# Patient Record
Sex: Male | Born: 1935 | Race: White | Hispanic: No | Smoking: Former smoker
Health system: Southern US, Community
[De-identification: ages and names within clinical notes are randomized; demographics above are authoritative.]

## PROBLEM LIST (undated history)

## (undated) DIAGNOSIS — K222 Esophageal obstruction: Secondary | ICD-10-CM

## (undated) DIAGNOSIS — K449 Diaphragmatic hernia without obstruction or gangrene: Secondary | ICD-10-CM

## (undated) DIAGNOSIS — H9190 Unspecified hearing loss, unspecified ear: Secondary | ICD-10-CM

## (undated) DIAGNOSIS — N529 Male erectile dysfunction, unspecified: Secondary | ICD-10-CM

## (undated) DIAGNOSIS — K5730 Diverticulosis of large intestine without perforation or abscess without bleeding: Secondary | ICD-10-CM

## (undated) DIAGNOSIS — K219 Gastro-esophageal reflux disease without esophagitis: Secondary | ICD-10-CM

## (undated) DIAGNOSIS — Z85038 Personal history of other malignant neoplasm of large intestine: Secondary | ICD-10-CM

## (undated) DIAGNOSIS — I639 Cerebral infarction, unspecified: Secondary | ICD-10-CM

## (undated) DIAGNOSIS — Z9989 Dependence on other enabling machines and devices: Secondary | ICD-10-CM

## (undated) DIAGNOSIS — B009 Herpesviral infection, unspecified: Secondary | ICD-10-CM

## (undated) DIAGNOSIS — R42 Dizziness and giddiness: Secondary | ICD-10-CM

## (undated) DIAGNOSIS — R972 Elevated prostate specific antigen [PSA]: Secondary | ICD-10-CM

## (undated) DIAGNOSIS — G459 Transient cerebral ischemic attack, unspecified: Secondary | ICD-10-CM

## (undated) DIAGNOSIS — N400 Enlarged prostate without lower urinary tract symptoms: Secondary | ICD-10-CM

## (undated) DIAGNOSIS — G4710 Hypersomnia, unspecified: Secondary | ICD-10-CM

## (undated) DIAGNOSIS — E785 Hyperlipidemia, unspecified: Secondary | ICD-10-CM

## (undated) DIAGNOSIS — G4700 Insomnia, unspecified: Secondary | ICD-10-CM

## (undated) DIAGNOSIS — N419 Inflammatory disease of prostate, unspecified: Secondary | ICD-10-CM

## (undated) DIAGNOSIS — G4733 Obstructive sleep apnea (adult) (pediatric): Secondary | ICD-10-CM

## (undated) DIAGNOSIS — K2270 Barrett's esophagus without dysplasia: Secondary | ICD-10-CM

## (undated) DIAGNOSIS — R413 Other amnesia: Secondary | ICD-10-CM

## (undated) DIAGNOSIS — C61 Malignant neoplasm of prostate: Secondary | ICD-10-CM

## (undated) HISTORY — DX: Diaphragmatic hernia without obstruction or gangrene: K44.9

## (undated) HISTORY — PX: EYE SURGERY: SHX253

## (undated) HISTORY — DX: Transient cerebral ischemic attack, unspecified: G45.9

## (undated) HISTORY — DX: Hyperlipidemia, unspecified: E78.5

## (undated) HISTORY — DX: Obstructive sleep apnea (adult) (pediatric): G47.33

## (undated) HISTORY — DX: Other amnesia: R41.3

## (undated) HISTORY — DX: Herpesviral infection, unspecified: B00.9

## (undated) HISTORY — DX: Esophageal obstruction: K22.2

## (undated) HISTORY — DX: Personal history of other malignant neoplasm of large intestine: Z85.038

## (undated) HISTORY — DX: Barrett's esophagus without dysplasia: K22.70

## (undated) HISTORY — DX: Hypersomnia, unspecified: G47.10

## (undated) HISTORY — DX: Cerebral infarction, unspecified: I63.9

## (undated) HISTORY — DX: Diverticulosis of large intestine without perforation or abscess without bleeding: K57.30

## (undated) HISTORY — DX: Elevated prostate specific antigen (PSA): R97.20

## (undated) HISTORY — DX: Malignant neoplasm of prostate: C61

## (undated) HISTORY — DX: Dizziness and giddiness: R42

## (undated) HISTORY — DX: Dependence on other enabling machines and devices: Z99.89

## (undated) HISTORY — PX: REFRACTIVE SURGERY: SHX103

## (undated) HISTORY — DX: Benign prostatic hyperplasia without lower urinary tract symptoms: N40.0

## (undated) HISTORY — PX: INTRACAPSULAR CATARACT EXTRACTION: SHX361

## (undated) HISTORY — DX: Male erectile dysfunction, unspecified: N52.9

---

## 2004-07-18 ENCOUNTER — Inpatient Hospital Stay (HOSPITAL_COMMUNITY): Admission: RE | Admit: 2004-07-18 | Discharge: 2004-07-19 | Payer: Self-pay | Admitting: Surgery

## 2004-09-25 ENCOUNTER — Ambulatory Visit (HOSPITAL_COMMUNITY): Admission: RE | Admit: 2004-09-25 | Discharge: 2004-09-25 | Payer: Self-pay | Admitting: Obstetrics and Gynecology

## 2004-09-25 ENCOUNTER — Encounter (INDEPENDENT_AMBULATORY_CARE_PROVIDER_SITE_OTHER): Payer: Self-pay | Admitting: *Deleted

## 2005-03-05 ENCOUNTER — Ambulatory Visit (HOSPITAL_COMMUNITY): Admission: RE | Admit: 2005-03-05 | Discharge: 2005-03-05 | Payer: Self-pay | Admitting: Surgery

## 2005-03-05 ENCOUNTER — Encounter (INDEPENDENT_AMBULATORY_CARE_PROVIDER_SITE_OTHER): Payer: Self-pay | Admitting: *Deleted

## 2005-06-27 ENCOUNTER — Encounter (INDEPENDENT_AMBULATORY_CARE_PROVIDER_SITE_OTHER): Payer: Self-pay | Admitting: *Deleted

## 2005-06-27 ENCOUNTER — Ambulatory Visit (HOSPITAL_COMMUNITY): Admission: RE | Admit: 2005-06-27 | Discharge: 2005-06-27 | Payer: Self-pay | Admitting: Obstetrics and Gynecology

## 2007-04-29 ENCOUNTER — Encounter (INDEPENDENT_AMBULATORY_CARE_PROVIDER_SITE_OTHER): Payer: Self-pay | Admitting: Gastroenterology

## 2007-04-29 ENCOUNTER — Ambulatory Visit (HOSPITAL_COMMUNITY): Admission: RE | Admit: 2007-04-29 | Discharge: 2007-04-29 | Payer: Self-pay | Admitting: Gastroenterology

## 2009-03-16 ENCOUNTER — Ambulatory Visit (HOSPITAL_COMMUNITY): Admission: RE | Admit: 2009-03-16 | Discharge: 2009-03-16 | Payer: Self-pay | Admitting: Surgery

## 2009-03-21 ENCOUNTER — Ambulatory Visit (HOSPITAL_COMMUNITY): Admission: RE | Admit: 2009-03-21 | Discharge: 2009-03-21 | Payer: Self-pay | Admitting: Family Medicine

## 2009-03-24 ENCOUNTER — Encounter: Admission: RE | Admit: 2009-03-24 | Discharge: 2009-03-24 | Payer: Self-pay | Admitting: Neurology

## 2010-01-18 ENCOUNTER — Encounter (INDEPENDENT_AMBULATORY_CARE_PROVIDER_SITE_OTHER): Payer: Self-pay | Admitting: *Deleted

## 2010-02-16 ENCOUNTER — Encounter (INDEPENDENT_AMBULATORY_CARE_PROVIDER_SITE_OTHER): Payer: Self-pay | Admitting: *Deleted

## 2010-02-16 ENCOUNTER — Ambulatory Visit: Payer: Self-pay | Admitting: Gastroenterology

## 2010-02-16 DIAGNOSIS — B009 Herpesviral infection, unspecified: Secondary | ICD-10-CM | POA: Insufficient documentation

## 2010-02-16 DIAGNOSIS — H04129 Dry eye syndrome of unspecified lacrimal gland: Secondary | ICD-10-CM | POA: Insufficient documentation

## 2010-02-16 DIAGNOSIS — K222 Esophageal obstruction: Secondary | ICD-10-CM | POA: Insufficient documentation

## 2010-02-16 DIAGNOSIS — K219 Gastro-esophageal reflux disease without esophagitis: Secondary | ICD-10-CM

## 2010-03-05 ENCOUNTER — Ambulatory Visit: Payer: Self-pay | Admitting: Gastroenterology

## 2010-04-09 ENCOUNTER — Telehealth: Payer: Self-pay | Admitting: Gastroenterology

## 2010-04-10 ENCOUNTER — Telehealth: Payer: Self-pay | Admitting: Gastroenterology

## 2010-12-05 NOTE — Progress Notes (Signed)
Summary: Insurance denied Aciphex  Phone Note Call from Patient Call back at [phone]   Call For: Dr Jarold Motto Summary of Call: Medicare denied the Aciphex wonders if we can resubmit with maybe less quantity or maybe just one month a time to see if they will cover it then.  Initial call taken by: Leanor Kail [facility],  April 10, 2010 10:34 AM  Follow-up for Phone Call        I was going to order chart to see if patient has tried any other PPIs but patient has no chart-EMR. Prior Authorization form from [employer] for Aciphex was sent but they want patient to try Nexium or Omeprazole first. Will give to Deeann Cree RN to see if we can change patients PPI.   Follow-up by: Ok Anis CMA,  April 11, 2010 3:12 PM  Additional Follow-up for Phone Call Additional follow up Details #1::        Change to Nexium 40 mg once daily. Additional Follow-up by: Ashok Cordia RN,  April 12, 2010 8:26 AM    Additional Follow-up for Phone Call Additional follow up Details #2::    RX for Nexium sent to [facility]  Follow-up by: Ok Anis CMA,  April 12, 2010 9:40 AM  New/Updated Medications: NEXIUM 40 MG CPDR (ESOMEPRAZOLE MAGNESIUM) one tablet by mouth once daily Prescriptions: NEXIUM 40 MG CPDR (ESOMEPRAZOLE MAGNESIUM) one tablet by mouth once daily  #90 x 3   Entered by:   Ok Anis CMA   Authorized by:   Mardella Layman MD [facility]   Signed by:   Ok Anis CMA on 04/12/2010   Method used:   Electronically to        [facility] [address] (retail)       [address] [place], [place]  [address]       Ph: [phone]       Fax: [phone]   RxID:   [id number]

## 2010-12-05 NOTE — Assessment & Plan Note (Signed)
Summary: BARRETTS ESOPHAGUS STRICTURE/YF   History of Present Illness Visit Type: Initial Consult Primary GI MD: Sheryn Bison MD FACP FAGA Primary Provider: Dr Earl Lites Chief Complaint: Patient c/o 2 months solid food dsyphagia. There is no odynophagia. There is no excessive belching, bloating or chest pain. History of Present Illness:   76 year old Caucasian male referred through the courtesy of Dr.Daub for evaluation progressive solid food dysphagia in the upper retropharyngeal area. He has a long history of acid reflux and uses p.r.n. and antacids, and apparently has had side effects in the past from PPI medication. He had endoscopy performed by Dr. Loreta Ave in June of 2008. He has severe esophagitis, underwent esophageal dilatation, and has had intermittent progressive dysphagia since that time. Biopsy for Helicobacter pylori were negative and also there was no evidence of Barrett's mucosa on biopsies.  He has a history of chronic recurrent colon polyps with resection endoscopically of a previous carcinoma in situ by Dr. Ovidio Kin. Last colonoscopy exam by Dr. Ezzard Standing was in May of 2010. He denies lower gastrointestinal symptoms, any hepatobiliary complaints, anorexia, nausea vomiting, or history of hepatitis or pancreatitis. He drinks 3-6 beers a day but denies problems with alcohol abuse. He has not smoked in over 20 years. He denies current cardiovascular, pulmonary, genitourinary complaints but is on Cialis. He is retired, exercises regular, is generally in excellent health.   GI Review of Systems    Reports acid reflux and  heartburn.      Denies abdominal pain, belching, bloating, chest pain, dysphagia with liquids, dysphagia with solids, loss of appetite, nausea, vomiting, vomiting blood, weight loss, and  weight gain.        Denies anal fissure, black tarry stools, change in bowel habit, constipation, diarrhea, diverticulosis, fecal incontinence, heme positive stool, hemorrhoids,  irritable bowel syndrome, jaundice, light color stool, liver problems, rectal bleeding, and  rectal pain. Preventive Screening-Counseling & Management  Alcohol-Tobacco     Smoking Status: quit  Caffeine-Diet-Exercise     Does Patient Exercise: yes      Drug Use:  no.      Current Medications (verified): 1)  Cialis 20 Mg Tabs (Tadalafil) .... Prn 2)  Restasis 0.05 % Emul (Cyclosporine) .... As Needed 3)  Valacyclovir Hcl 1 Gm Tabs (Valacyclovir Hcl) .... 1/2 Qd  Allergies (verified): No Known Drug Allergies  Past History:  Past medical, surgical, family and social histories (including risk factors) reviewed for relevance to current acute and chronic problems.  Past Medical History: Barretts Esophagus Herpes  Past Surgical History: Lasik Eye surgery Left eye cataract extraction  Family History: Reviewed history and no changes required. No FH of Colon Cancer: Family History of Liver Disease/Cirrhosis: Mother   Social History: Reviewed history from 02/14/2010 and no changes required. Alcohol Use - yes-3-6 beers daily Patient is a former smoker. -stopped in 1963 Occupation: Retired Illicit Drug Use - no Patient gets regular exercise. Smoking Status:  quit Drug Use:  no Does Patient Exercise:  yes  Review of Systems  The patient denies allergy/sinus, anemia, anxiety-new, arthritis/joint pain, back pain, blood in urine, breast changes/lumps, change in vision, confusion, cough, coughing up blood, depression-new, fainting, fatigue, fever, headaches-new, hearing problems, heart murmur, heart rhythm changes, itching, menstrual pain, muscle pains/cramps, night sweats, nosebleeds, pregnancy symptoms, shortness of breath, skin rash, sleeping problems, sore throat, swelling of feet/legs, swollen lymph glands, thirst - excessive , urination - excessive , urination changes/pain, urine leakage, vision changes, and voice change.    Vital Signs:  Patient profile:   76 year old  male Height:      66 inches Weight:      148.25 pounds BMI:     24.01 BSA:     1.76 Pulse rate:   68 / minute Pulse rhythm:   regular BP sitting:   144 / 76  (left arm)  Vitals Entered By: Hortense Ramal CMA Duncan Dull) (February 16, 2010 9:10 AM)  Physical Exam  General:  Well developed, well nourished, no acute distress.healthy appearing.  I cannot appreciate stigmata of chronic liver disease. Head:  Normocephalic and atraumatic. Eyes:  PERRLA, no icterus.exam deferred to patient's ophthalmologist.   Neck:  Supple; no masses or thyromegaly. Lungs:  Clear throughout to auscultation. Heart:  Regular rate and rhythm; no murmurs, rubs,  or bruits. Abdomen:  Soft, nontender and nondistended. No masses, hepatosplenomegaly or hernias noted. Normal bowel sounds. Rectal:  deferred Msk:  Symmetrical with no gross deformities. Normal posture. Pulses:  Normal pulses noted. Extremities:  No clubbing, cyanosis, edema or deformities noted. Neurologic:  Alert and  oriented x4;  grossly normal neurologically. Cervical Nodes:  No significant cervical adenopathy. Inguinal Nodes:  No significant inguinal adenopathy. Psych:  Alert and cooperative. Normal mood and affect.   Impression & Recommendations:  Problem # 1:  GERD (ICD-530.81) Assessment Unchanged He has chronic ulcerative esophagitis with a probable peptic stricture of his esophagus. I suspect once he has endoscopy esophageal dilatation he may actually develop clinical GERD symptoms. I scheduled him for endoscopy and esophageal dilatation. He does not desire pharmacologic medications at this time. Therefore, I've advised p.r.n. antacid use of a standard antireflux maneuvers and a dysphagia diet until reaching complete his endoscopic procedure and dilation.The Risk and Benefits of this procedure has been explained in detail, and he agrees to proceed.  Problem # 2:  BARRETTS ESOPHAGUS (ICD-530.85) Assessment: Unchanged This is  not confirmed by  review of his esophageal biopsies. There was benign gastric cardia type mucosa and not intestinal metaplasia on last exam  Problem # 3:  HSV (ICD-054.9) Assessment: Comment Only  Patient Instructions: 1)  Please continue current medications.  2)  You are scheduled for an upper endoscopy. 3)  GI Reflux brochure given.  4)  The medication list was reviewed and reconciled.  All changed / newly prescribed medications were explained.  A complete medication list was provided to the patient / caregiver. 5)  Copy sent to : Dr. Elgie Congo and Dr. Ovidio Kin in [place] [facility] 6)  Please continue current medications.  7)  Dysphagia diet provided.  8)  Avoid foods high in acid content ( tomatoes, citrus juices, spicy foods) . Avoid eating within 3 to 4 hours of lying down or before exercising. Do not over eat; try smaller more frequent meals. Elevate head of bed four inches when sleeping.  9)  Conscious Sedation brochure given.  10)  Upper Endoscopy with Dilatation brochure given.   Appended Document: BARRETTS ESOPHAGUS STRICTURE/YF    Clinical Lists Changes  Orders: Added new Test order of EGD (EGD) - Signed

## 2010-12-05 NOTE — Letter (Signed)
Summary: EGD Instructions  [facility]  [address] [place], [place] [address]   Phone: [phone]  Fax: [phone]       [name]    22-Jan-1935    MRN: [id number]       Procedure Day /Date: Monday, 03/05/10     Arrival Time:  1:30     Procedure Time: 2:30     Location of Procedure:                    X_  _ [facility] (4th Floor)    PREPARATION FOR ENDOSCOPY   On 03/05/10  THE DAY OF THE PROCEDURE:  1.   No solid foods, milk or milk products are allowed after midnight the night before your procedure.  2.   Do not drink anything colored red or purple.  Avoid juices with pulp.  No orange juice.  3.  You may drink clear liquids until 12:30, which is 2 hours before your procedure.                                                                                                CLEAR LIQUIDS INCLUDE: Water Jello Ice Popsicles Tea (sugar ok, no milk/cream) Powdered fruit flavored drinks Coffee (sugar ok, no milk/cream) Gatorade Juice: apple, white grape, white cranberry  Lemonade Clear bullion, consomm, broth Carbonated beverages (any kind) Strained chicken noodle soup Hard Candy   MEDICATION INSTRUCTIONS  Unless otherwise instructed, you should take regular prescription medications with a small sip of water as early as possible the morning of your procedure.                      OTHER INSTRUCTIONS  You will need a responsible adult at least 74 years of age to accompany you and drive you home.   This person must remain in the waiting room during your procedure.  Wear loose fitting clothing that is easily removed.  Leave jewelry and other valuables at home.  However, you may wish to bring a book to read or an iPod/MP3 player to listen to music as you wait for your procedure to start.  Remove all body piercing jewelry and leave at home.  Total time from sign-in until discharge is approximately 2-3 hours.  You should go  home directly after your procedure and rest.  You can resume normal activities the day after your procedure.  The day of your procedure you should not:   Drive   Make legal decisions   Operate machinery   Drink alcohol   Return to work  You will receive specific instructions about eating, activities and medications before you leave.    The above instructions have been reviewed and explained to me by   _______________________    I fully understand and can verbalize these instructions _____________________________ Date _________

## 2010-12-05 NOTE — Progress Notes (Signed)
Summary: Medication  Phone Note Call from Patient Call back at Home Phone [phone]   Caller: Patient Call For: Dr. Jarold Motto Reason for Call: Talk to Nurse Summary of Call: pt is needing a script for Aciphex [facility] Initial call taken by: Karna Christmas,  April 09, 2010 11:12 AM  Follow-up for Phone Call        Pt notified.  Rx sent as requested. Follow-up by: Ashok Cordia RN,  April 09, 2010 11:29 AM    New/Updated Medications: ACIPHEX 20 MG  TBEC (RABEPRAZOLE SODIUM) Take 1 each day 30 minutes before meals Prescriptions: ACIPHEX 20 MG  TBEC (RABEPRAZOLE SODIUM) Take 1 each day 30 minutes before meals  #90 x 3   Entered by:   Ashok Cordia RN   Authorized by:   Mardella Layman MD [facility]   Signed by:   Ashok Cordia RN on 04/09/2010   Method used:   Electronically to        [facility] [address] (retail)       [address] [place], [place]  [address]       Ph: [phone]       Fax: [phone]   RxID:   [id number]

## 2010-12-05 NOTE — Letter (Signed)
Summary: New Patient letter  [facility]  [address] [place], [place] [address]   Phone: [phone]  Fax: [phone]       01/18/2010 MRN: [id number]  [name] [address] [place], [place]  [address]  Dear [name],  Welcome to the Gastroenterology Division at [facility].    You are scheduled to see Dr.  Jarold Motto on 02-16-10 at 9AM_ on the 3rd floor at [facility], [address]  We ask that you try to arrive at our office 15 minutes prior to your appointment time to allow for check-in.  We would like you to complete the enclosed self-administered evaluation form prior to your visit and bring it with you on the day of your appointment.  We will review it with you.  Also, please bring a complete list of all your medications or, if you prefer, bring the medication bottles and we will list them.  Please bring your insurance card so that we may make a copy of it.  If your insurance requires a referral to see a specialist, please bring your referral form from your primary care physician.  Co-payments are due at the time of your visit and may be paid by cash, check or credit card.     Your office visit will consist of a consult with your physician (includes a physical exam), any laboratory testing he/she may order, scheduling of any necessary diagnostic testing (e.g. x-ray, ultrasound, CT-scan), and scheduling of a procedure (e.g. Endoscopy, Colonoscopy) if required.  Please allow enough time on your schedule to allow for any/all of these possibilities.    If you cannot keep your appointment, please call [phone] to cancel or reschedule prior to your appointment date.  This allows [place] the opportunity to schedule an appointment for another patient in need of care.  If you do not cancel or reschedule by 5 p.m. the business day prior to your appointment date, you will be charged a $50.00 late cancellation/no-show fee.    Thank you for choosing  [facility] for your medical needs.  We appreciate the opportunity to care for you.  Please visit [place] at our website  to learn more about our practice.                     Sincerely,                                                             The Gastroenterology Division

## 2010-12-05 NOTE — Procedures (Signed)
Summary: Upper Endoscopy  Patient: [name] Note: All result statuses are Final unless otherwise noted.  Tests: (1) Upper Endoscopy (EGD)   EGD Upper Endoscopy       DONE      [facility]     [address]     [place], [place]  [address]           ENDOSCOPY PROCEDURE REPORT           PATIENT:  [name], [name]  MR#:  [id number]     BIRTHDATE:  [birth year], 74 yrs. old  GENDER:  male           ENDOSCOPIST:  Vania Rea. Jarold Motto, MD, [facility]     Referred by:  Lesle Chris, M.D.           PROCEDURE DATE:  03/05/2010     PROCEDURE:  Elease Hashimoto Dilation of Esophagus, EGD, diagnostic     ASA CLASS:  Class II     INDICATIONS:  dysphagia           MEDICATIONS:   Fentanyl 50, Versed 5 mg IV     TOPICAL ANESTHETIC:  Exactacain Spray           DESCRIPTION OF PROCEDURE:   After the risks benefits and     alternatives of the procedure were thoroughly explained, informed     consent was obtained.  The LB GIF-H180 [id number] endoscope was     introduced through the mouth and advanced to the second portion of     the duodenum, without limitations.  The instrument was slowly     withdrawn as the mucosa was fully examined.     <<PROCEDUREIMAGES>>           A hiatal hernia was found. 5 CM. HH NOTED.STRICTURE AT GE JINCTION     DILATED WITH #59F MAL0ONEY DILATOR.  The esophagus and     gastroesophageal junction were completely normal in appearance.     "LARE INLET POUCH" OF HETEROPHIC GASTRIC MUCOSA IN PROXIMAL     ESOPHAGUS.SEE PICTURES.  Normal duodenal folds were noted.     Retroflexed views revealed a hiatal hernia.    The scope was then     withdrawn from the patient and the procedure completed.           COMPLICATIONS:  None           ENDOSCOPIC IMPRESSION:     1) Hiatal hernia     2) Normal esophagus     3) Normal duodenal folds     MOST SYMPTOMS PROBABLY RELATED TO CERVICAL INLET POUCH AND LOCAL     ACID PRODUCTION AND ?? CHRONIC ASPIRATION.     RECOMMENDATIONS:     TRIAL OF  ACIPHEX 20MG /DAY           REPEAT EXAM:  No           ______________________________     Vania Rea. Jarold Motto, MD, [facility]           CC:           n.     eSIGNED:   [name] at 03/05/2010 03:08 PM           Emery, [name], [id number]  Note: An exclamation mark (!) indicates a result that was not dispersed into the flowsheet. Document Creation Date: 03/05/2010 3:08 PM _______________________________________________________________________  (1) Order result status: Final Collection or observation date-time: 03/05/2010 14:59 Requested date-time:  Receipt date-time:  Reported date-time:  Referring Physician:   Ordering Physician: Sheryn Bison [phone]) Specimen Source:  Source: Launa Grill Order Number: [id number] Lab site:

## 2011-03-19 NOTE — Op Note (Signed)
[name], [name]               ACCOUNT NO.:  [id number]   MEDICAL RECORD NO.:  [id number]          PATIENT TYPE:  AMB   LOCATION:  ENDO                         FACILITY:  [facility]   PHYSICIAN:  Anselmo Rod, M.D.  DATE OF BIRTH:  February 01, 1935   DATE OF PROCEDURE:  04/29/2007  DATE OF DISCHARGE:                               OPERATIVE REPORT   PROCEDURE PERFORMED:  Esophagogastroduodenoscopy with multiple cold  biopsies.   ENDOSCOPIST:  Anselmo Rod, M.D.   INSTRUMENT USED:  Pentax video panendoscope   INDICATIONS FOR PROCEDURE:  76 year old white male with a history of  occasional dysphagia undergoing EGD for meat impaction. The patient  claims he had a hamburger a couple of days ago but has not been able to  swallow his secretions since then.   PREPROCEDURE PREPARATION:  Informed consent was procured from the  patient.  The risks and benefits of the procedure were discussed with  the patient.   PREPROCEDURE PHYSICAL:  The patient had stable vital signs.  Neck  supple.  Chest clear to auscultation.  S1 and S2 regular.  Abdomen soft  with normal bowel sounds.   DESCRIPTION OF PROCEDURE:  The patient was placed in the left lateral  position and sedated with 40 mg of Fentanyl and 4 mg of Versed given  intravenously in slow incremental doses. Once the patient was adequately  sedated and maintained on low flow oxygen and continuous cardiac  monitoring, the Pentax video panendoscope was advanced through the  mouthpiece over the tongue into the esophagus under direct vision. Two  large patches of what appeared to be Barrett's like mucosa was biopsied  from 15 cm. Severe grade 4 distal esophagitis was noted.  There was no  meat impaction found in the esophagus.  The rest of the esophagus was  widely patent. On advancing the scope into the stomach, a small nodule  was noted in the mid body of the stomach that was biopsied. Antral  biopsies were done to rule out the presence of  H. pylori by pathology.  The proximal small bowel appeared normal. There was no obstruction. The  patient tolerated the procedure well without complications.  Retroflexion in the high cardia revealed a hiatal hernia.   IMPRESSION:  1. ?Barrett's mucosa at 15 cm, biopsies done, two large patches noted.  2. Grade 4 distal esophagitis.  3. Small nodule biopsied from midbody of stomach.  4. Antral biopsies done to rule out H. pylori.  5. Normal proximal small bowel.   RECOMMENDATIONS:  1. Double dose PPI have been advised, Nexium 40 mg b.i.d. being called      into his pharmacy.  He is to take this every morning 15-20 minutes      before breakfast and before dinner.  2. Carafate suspension 1 gram q.i.d. has been advised inbetween meals      and at bedtime.  3. Soft diet has been advocated for the next couple of days.  4. Outpatient follow-up in the next two weeks for further      recommendations.      Jyothi Nat  Loreta Ave, M.D.  Electronically Signed     JNM/MEDQ  D:  04/29/2007  T:  04/30/2007  Job:  [id number]   cc:   [name] Canales A. Cleta Alberts, M.D.  Fax: [phone]

## 2011-03-19 NOTE — Op Note (Signed)
NAMENGUYEN, TODOROV               ACCOUNT NO.:  [id number]   MEDICAL RECORD NO.:  [id number]          PATIENT TYPE:  AMB   LOCATION:  ENDO                         FACILITY:  [facility]   PHYSICIAN:  Sandria Bales. Ezzard Standing, M.D.  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   POSTOPERATIVE DIAGNOSIS:  No colonic polyps, pancolonic diverticulosis.   PROCEDURE:  Flexible colonoscopy.   SURGEON:  Sandria Bales. Ezzard Standing, M.D.   ANESTHESIA:  50 mcg of fentanyl, 5 mg of Versed.   INDICATIONS FOR PROCEDURE:  [name] is a 76 year old white male who  I have seen before for colonic polyps.  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  He underwent  subsequently colonoscopies in May of 2006 and August of 2006, with his  last colonoscopy showing some minimal disease still at 15 cm, with no  other polyps identified.  So it has been 3-1/2 years since his last  colonoscopy.  Discussed with him that it would be wise to repeat his  colonoscopy to reevaluate his colon.  The indications and potential  complications were explained to the patient.  The potential  complications include but are not limited to, bleeding, perforation,  inability to remove a polyp or tumor. The patient completed a HalfLytely  bowel prep at home and came to the endoscopy suite at [facility].   PROCEDURE NOTE:  The patient was placed in the left lateral decubitus  position.  He had an IV in his right hand.  He was monitored with pulse  oximetry, EKG, and a blood pressure cuff and given nasal 02 during the  procedure.   He was given 50 mcg of fentanyl and 5 mg of Versed.   A flexible Pentax  colonoscope was passed without difficulty around to his ileocecal valve.  The ileocecal valve was visualized.  He was noted to have some  diverticula of his right colon and of his transverse colon.  Most of the  diverticula were noticed his sigmoid colon.  I went carefully examined  the areas where he had had multiple prior polyps, such as 50 and 30 cm  from the anal verge and I saw no polyps.  Particular attention was paid  to 15 cm from the anal verge where his large polyp was excised several  years ago and though I saw some white scarring, I saw no evidence of any  new mucosal lesion or nodule and nothing to biopsy.  The scope was  retroflexed within his rectum, and there was no polyp or lesion within  the rectum.   With basically a negative exam and no residual polyps, I think now he  can  go to 5 years to his next exam unless he would have symptoms of  polyps before then.      Sandria Bales. Ezzard Standing, M.D.  Electronically Signed     DHN/MEDQ  D:  03/16/2009  T:  03/16/2009  Job:  [id number]   cc:   Bertram Millard. Dahlstedt, M.D.  Fax: [phone]

## 2011-03-22 NOTE — Op Note (Signed)
[name], [name]               ACCOUNT NO.:  [id number]   MEDICAL RECORD NO.:  [id number]          PATIENT TYPE:  AMB   LOCATION:  ENDO                         FACILITY:  [facility]   PHYSICIAN:  Sandria Bales. Ezzard Standing, M.D.  DATE OF BIRTH:  May 21, 1935   DATE OF PROCEDURE:  09/25/2004  DATE OF DISCHARGE:                                 OPERATIVE REPORT   [facility] [phone]   PREOPERATIVE DIAGNOSIS:  History of change in bowel habits with rectal  abscess, need for colonoscopy.   POSTOPERATIVE DIAGNOSIS:  Multiple colonic polyps (at 50 cm, 30 cm, 20 cm  and 15 cm) with the largest being approximately 2 x 4 cm in size and colonic  diverticulosis.   PROCEDURE:  Flexible colonoscopy with hot biopsy of polyp at 50 cm, snare  removal of polyps at 30, 20 and 15 cm.   SURGEON:  Sandria Bales. Ezzard Standing, M.D.   FIRST ASSISTANT:  None.   ANESTHESIA:  Demerol 50 mg and Versed 4 mg.   COMPLICATIONS:  None.   INDICATIONS FOR PROCEDURE:  The patient is a 76 year old white male who  presented in September with a perianal abscess.  I actually saw him in the  office, but then sent him to Dr. Daphine Deutscher who did an incision and drainage at  the hospital. He has come back for evaluation for possible colonoscopy.  He  has had maybe a little bit of change in bowel habits with the rectal  abscess, but no other rectal complications.   The indications and potential complications were explained to the patient.  The potential complications include, but are not limited to, bleeding or  perforation from either the polyp or biopsies.   DESCRIPTION OF PROCEDURE:  The patient was placed in the left lateral  decubitus position.  He had an IV in place. He was monitored by pulse  oximetry, had nasal oxygen running, had an EKG continuous strip and blood  pressure cuff.   He was given initially 50 mg of Demerol and 4 mg of Versed and a flexible  Olympus colonoscope was passed without difficulty around to his cecum.  The  cecum and  ileocecal valve were visualized.  He did have at least two or  three small diverticula of his right colon.  His transverse colon was  unremarkable.  His left colon had maybe some distal diverticula.   At 50 cm, he had an approximately 7 or 8 mm polyp which I burned with hot  biopsy forceps.  At 30 cm, he had about a 1.5 cm polyp which I removed with  the snare and retrieved.  At 20 cm, he had about another 1.5 cm polyp that I  removed with the snare and this time retrieved in the trap and then at 15 cm  he had a sessile mass that was  along the right lateral anterior high rectal  wall.  Again, this measured about 2 x 4 cm that I removed in three separate  snare retrievals.  I thought I had removed at least 90 plus percent of this  polyp and possibly destroyed it  all. I did not want to do any further today  for fear of going through the rectal wall.  All of these specimens were sent  to pathology for review.   The scope was then retroflexed within the rectum.  He had no other rectal  masses noted.   FINAL DIAGNOSIS:  Small polyp at 50 cm, medium size pedunculated polyps at  30 and 15 cm and then a large sessile polyp which had to be removed by  multiple snares at 15 cm from the anal verge.   PLAN:  The patient will call the office to check on the pathology within a  week.  I do think that because of the large sessile polyp, he needs a repeat  colonoscopy in three to four months to make sure this polyp has been excised  in its entirety.  Obviously, if the pathology shows malignancy, this would  change our plan and diagnosis.      Davi   DHN/MEDQ  D:  09/25/2004  T:  09/25/2004  Job:  [id number]   cc:   [name] Canales A. Cleta Alberts, M.D.  [address]  [place]  [place] [address]  Fax: [phone]   Thornton Park. Daphine Deutscher, MD  [address]  [place]  [place] [address]  Fax: [phone]

## 2011-03-22 NOTE — Discharge Summary (Signed)
NAME:  [name], [name]                         ACCOUNT NO.:  [id number]   MEDICAL RECORD NO.:  [id number]                   PATIENT TYPE:  INP   LOCATION:  0456                                 FACILITY:  [facility]   PHYSICIAN:  Thornton Park. Daphine Deutscher, M.D.             DATE OF BIRTH:  02-Nov-1935   DATE OF ADMISSION:  07/17/2004  DATE OF DISCHARGE:  07/19/2004                                 DISCHARGE SUMMARY   ADMITTING DIAGNOSIS:  Perirectal induration and infection.   POSTOPERATIVE DIAGNOSIS:  Perirectal induration and infection.   PROCEDURE:  July 17, 2004, examination under anesthesia/incision and  drainage, perirectal abscess/cellulitis.   HOSPITAL COURSE:  [name] was admitted through our urgent office at [facility]  with perirectal induration which happened to be on the right side.  It was  checked in the holding area and localized, and taken to the OR where the  indurated area on the right side was drained, although there was not any  frank abscess drained.  He was placed on IV Unasyn and responded nicely to  the combination of surgical therapy and antibiotics.  His pain was gone on  the day of discharge, July 19, 2004.   DISCHARGE MEDICATIONS:  At that point, he will be given Augmentin 875 mg  p.o. b.i.d. for 7 days and Vicodin if needed for pain.  He can return to the  Colace that he normally takes at home.   FOLLOWUP:  He is instructed to return to the office in 7-10 days for recheck  and at that time, we are making arrangements for followup colonoscopy with  Dr. Sandria Bales. Newman.   FINAL DIAGNOSIS:  Perirectal infection, status post surgical and medical  treatment.                                               Thornton Park Daphine Deutscher, M.D.    MBM/MEDQ  D:  07/19/2004  T:  07/19/2004  Job:  [id number]

## 2011-03-22 NOTE — Op Note (Signed)
[name], [name]               ACCOUNT NO.:  [id number]   MEDICAL RECORD NO.:  [id number]          PATIENT TYPE:  AMB   LOCATION:  ENDO                         FACILITY:  [facility]   PHYSICIAN:  Sandria Bales. Ezzard Standing, M.D.  DATE OF BIRTH:  01/30/1935   DATE OF PROCEDURE:  03/05/2005  DATE OF DISCHARGE:                                 OPERATIVE REPORT   CCS NUMBER:  [id number].   PREOPERATIVE DIAGNOSIS:  History of multiple colonic polyps.   POSTOPERATIVE DIAGNOSIS:  Single colonic polyp at 15 cm from the anal verge,  approximately 2 cm in diameter, and modest sigmoid colonic diverticulosis.   PROCEDURE:  Flexible colonoscopy, with hot biopsy of polyp at 15 cm.   SURGEON:  Sandria Bales. Ezzard Standing, M.D.   No first assistant.   ANESTHESIA:  50 mg of Demerol, 4 mg of Versed.   INDICATIONS FOR PROCEDURE:  [name] is a 76 year old white male who had  a perirectal abscess in September 2005.  Dr. Daphine Deutscher drained this abscess and  referred him to me for initial colonoscopy.  At that time, he was found to  have multiple colonic polyps at 50 cm, 30 cm, 20 cm, and 15 cm from the anal  verge.  The polyps were all removed with hot biopsy or snare removal.  However, the polyp at 15 cm was large and probably over 4 cm in diameter. 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  I thought I removed at least  90% of that polyp.  He now comes back for reexamination of his colon, both  for the polyp that may be some piece left behind and the remainder of his  colon also.   He completed a HalfLytely bowel prep at home.  He understands the  indications and potential complications to the procedure.  Potential  complications include but are not limited to bleeding and perforation either  from biopsying the polyp or advancing the colonoscope.   OPERATIVE NOTE:  The patient was placed in the left lateral decubitus position.  He had an IV  in place in his left arm.  He was monitored with pulse oximetry, had  nasal  oxygen running at 2 liters.  He had an EKG with a continuous strip and a  blood pressure cuff in place.   He was given 50 mg of Demerol and 3 mg of Versed initially, then an  additional mg of Versed was given during the procedure.  The flexible  Olympus colonoscope was passed without difficulty around to the cecum.  His  cecum and ileocecal valve were visualized.  There were a few small  diverticula of his right colon.  His transverse colon was unremarkable.  His  left colon was unremarkable.  He did have some evidence of sigmoid colonic  diverticula.   Particular attention was paid where I found the previous polyps, and there  was no obvious polyp or growth at 50 cm, 30 cm, or 20 cm.  However, at 15 cm  from the anal verge, the patient did have an approximately 2 cm sessile-  appearing polyp  that still had benign-appearing characteristics.  I biopsied  this multiple times with hot cautery forceps trying to destroy the entire  polyp.   The scope was withdrawn into the rectum and retroflexed.  There was no polyp  or mass within the rectum.   The sole finding was a sessile polyp maybe 2 cm in diameter at 15 cm from  the anal verge.  I think this probably does represent about 5-10% residual  from the previous polyp that had been excised and has benign  characteristics.  If this shows no obvious atypia, then I would recommend a  repeat colonoscopy in six months' time.  If there is atypia, maybe repeat  the colonoscopy at an earlier time.      DHN/MEDQ  D:  03/05/2005  T:  03/05/2005  Job:  [id number]   cc:   [name] Canales A. Cleta Alberts, M.D.  [address]  [place]  [place] [address]  Fax: [phone]

## 2011-03-22 NOTE — Op Note (Signed)
NAME:  [name], [name]                         ACCOUNT NO.:  [id number]   MEDICAL RECORD NO.:  [id number]                   PATIENT TYPE:  AMB   LOCATION:  DAY                                  FACILITY:  [facility]   PHYSICIAN:  Thornton Park. Daphine Deutscher, M.D.             DATE OF BIRTH:  1934-11-09   DATE OF PROCEDURE:  07/17/2004  DATE OF DISCHARGE:                                 OPERATIVE REPORT   PREOPERATIVE DIAGNOSES:  Perirectal induration and tenderness treated by Dr.  Earl Lites over the last several days.   POSTOPERATIVE DIAGNOSES:  Induration, probable cellulitis, status post  examination under anesthesia with incision and drainage.   SURGEON:  Thornton Park. Daphine Deutscher, M.D.   ANESTHESIA:  General.   INDICATIONS FOR PROCEDURE:  [name] was seen in the holding area by me  and examined to localize the area of tenderness. With him in the left  lateral position, he had an area of fullness and more tenderness noted on  the right side which from different from impression by Dr. Ezzard Standing. The  patient, however, was able to be examined by me and there was a fullness but  nothing pointing on the outside. We elected to proceed with exam under  anesthesia with possible drainage.   The patient was taken back to room one where with LMA anesthesia he was  placed up in stirrups.  On the patient's right side, the inside buttock was  examined and was slightly  more red. Digital exam revealed fullness higher  in the mid rectum to palpation and with palpation it did seem there was  possibly an abscess or just indurated tissue. I could not feel anything on  the patient's left side or left posterior area.  I initially attempted  aspiration and inserted an 18 gauge needle that did not receive any  purulence. After examining him on the inside whereupon I could see some  smaller internal hemorrhoids and with palpation, I elected to make the small  radial cut and then probe this area which I did. I did not  encounter any  frank purulence but a little fluid possibly represented infected cellulitis  or partially treated abscess.  At this point, I elected to irrigate this  with hydrogen peroxide and to dress this.  No other palpable areas or  suspicious areas could be identified. The patient will be admitted for  observation and will be placed on IV Unasyn with careful followup.                                               Thornton Park Daphine Deutscher, M.D.    MBM/MEDQ  D:  07/17/2004  T:  07/17/2004  Job:  [id number]

## 2011-03-22 NOTE — Op Note (Signed)
[name], [name]               ACCOUNT NO.:  [id number]   MEDICAL RECORD NO.:  [id number]          PATIENT TYPE:  AMB   LOCATION:  ENDO                         FACILITY:  [facility]   PHYSICIAN:  Sandria Bales. Ezzard Standing, M.D.  DATE OF BIRTH:  August 10, 1935   DATE OF PROCEDURE:  06/27/2005  DATE OF DISCHARGE:                                 OPERATIVE REPORT   PREOPERATIVE DIAGNOSIS:  History of carcinoma in situ and polyp at 15 cm  from the anal verge with multiple other colonic polyps.   POSTOPERATIVE DIAGNOSIS:  Single sessile polyp at 15 cm from the anal verge  approximately 2 cm by 1 cm in size, with pancolonic diverticulosis.   PROCEDURE:  Flexible colonoscopy with hot biopsy of polyp at 15 cm.   SURGEON:  Sandria Bales. Ezzard Standing, M.D.   FIRST ASSISTANT:  None.   ANESTHESIA:  50 mg Demerol and 3 mg Versed.   INDICATIONS FOR PROCEDURE:  [name] is a 76 year old white male who had  a perirectal abscess in September 2005 drained by Dr. Wenda Low.  The  patient never had any prior colonic exam and he asked me to see the patient.  My initial colonoscopy was to find multiple colonic polyps at 50, 30, 20,  and 15 cm.  However, at 15 cm, there was a very large polyp over 4 cm in  diameter and this proved to have carcinoma in situ in the polyp.  He then  came back for a second colonoscopy in May 2006.  At that time, there was a  residual flat polyp which, by biopsy, had dysplasia but no malignancy and I  burned that polyp at that time.  He now comes back for repeat exam of this  area.   DESCRIPTION OF PROCEDURE:  With the patient in the left lateral decubitus  position, had an IV in his right hand.  He was monitored with pulse  oximetry, EKG, and blood pressure cuff, and given nasal O2 running at 2  liters.  He was given 50 mg Demerol and 3 mg Versed initially, and the  flexible Olympus colonoscope was passed without difficulty.   I identified the cecum and ileocecal valve.  There were  diverticula noted in  the right colon, transverse colon, and left colon, but there was no lesion  or polyp noted.  I tried to re-examine the prior areas where the patient had  polyps, at 50, 30, and 20 cm, with particular attention in these areas and  saw no residual polyps.   He did, however, still have about a 2 cm flat sessile polyp at 15 cm from  the anal verge.  It appears maybe about half the size of what I think it  appeared the last time and still had a benign appearance.  I biopsied it  multiple times and then tried to burn it or coagulate it multiple times with  the Bovie cautery.  The scope was then withdrawn in the rectum which was  negative.  He tolerated the procedure well.  Unless the pathology shows  malignancy, would consider repeat colonoscopy  in approximately four months  to have another look at this polyp area.   The patient tolerated the procedure well, will check with [place] within a week  for pathology and schedule his colonoscopy.      Sandria Bales. Ezzard Standing, M.D.  Electronically Signed     DHN/MEDQ  D:  06/27/2005  T:  06/28/2005  Job:  [id number]   cc:   [name] Canales A. Cleta Alberts, M.D.  [address]  [facility]  [place] [address]  Fax: [phone]

## 2011-03-22 NOTE — H&P (Signed)
NAME:  HERSON, [name]                         ACCOUNT NO.:  [id number]   MEDICAL RECORD NO.:  [id number]                   PATIENT TYPE:  AMB   LOCATION:  DAY                                  FACILITY:  [facility]   PHYSICIAN:  Sandria Bales. Ezzard Standing, M.D.               DATE OF BIRTH:  1935-09-14   DATE OF ADMISSION:  07/17/2004  DATE OF DISCHARGE:                                HISTORY & PHYSICAL   HISTORY OF PRESENT ILLNESS:  [name] is a 76 year old white male who  sees Dr. Lesle Chris as his primary medical doctor.  He has had perirectal  pain beginning last Wednesday, July 11, 2004.  He has had worsening  pain.  He actually first saw Dr. Cleta Alberts on Thursday, July 12, 2004 and was  given a shot and given some antibiotics.  He got better for a day or two,  but then got steadily worse.  He was seen also on Friday, then this past  Monday, July 16, 2004, and then Tuesday, which is today, July 17, 2004, with continued rectal pain, and then referred to our office for  further evaluation.   He denies any history of peptic ulcer disease, liver disease, pancreatic  disease, change in bowel habits, though he has noticed a little bit more  infrequent stools this time.   PAST MEDICAL HISTORY:   ALLERGIES:  He is allergic to no medications.   MEDICATIONS:  1.  Cipro.  2.  He was given some Vicodin to Tylenol.  3.  He is on acyclovir for herpes.   REVIEW OF SYSTEMS:  PULMONARY:  No pneumonia or tuberculosis.  CARDIAC:  Negative for heart disease, chest pain, or hypertension.  GASTROINTESTINAL:  See history of present illness.  UROLOGIC:  He has seen Dr. Marcine Matar before for some prostate inflammation.   SOCIAL HISTORY:  He is not working now.   PHYSICAL EXAMINATION:  VITAL SIGNS:  Temperature is 100.6, pulse 84,  respirations 24, blood pressure 176/84.  GENERAL:  He is a well-nourished, pleasant, white male, alert and  cooperative with physical exam.  HEENT:   Unremarkable.  He has some impetigo or kind of paleness to his skin  in spots, __________.  NECK:  Supple without mass, without thyromegaly.  LUNGS:  Clear to auscultation.  HEART:  Regular rate and rhythm without murmur or rub.  ABDOMEN:  Soft.  RECTAL:  He has a fullness in his left posterior rectum consistent with an  intrarectal or perirectal abscess, which I do not see any evidence of a way  to drain this externally.  He is clearly tender in that area.  EXTREMITIES:  Good strength in all 4 extremities.  NEUROLOGIC:  Grossly intact.   LABORATORY DATA:  White blood count of 12,600, with a hemoglobin of 15,  hematocrit of 45.   IMPRESSION:  1.  Intrarectal/perirectal abscess, left posterior.  I have  been in touch      with Dr. Luretha Murphy, who is on call for [place], about examining him and      doing him under general anesthesia.  We also thought about a CT scan,      but I really think this is palpable enough to just go an do surgery,      which seems the most reasonable thing.  I discussed this with the      patient.  He will go to [facility].  He will stay NPO for      drainage of abscess.  2.  History of herpes.                                               Sandria Bales. Ezzard Standing, M.D.    DHN/MEDQ  D:  07/17/2004  T:  07/17/2004  Job:  [id number]   cc:   [name] Canales A. Cleta Alberts, M.D.  [address]  [place]  [place] [address]  Fax: [phone]

## 2011-05-27 ENCOUNTER — Other Ambulatory Visit: Payer: Self-pay | Admitting: *Deleted

## 2011-05-27 MED ORDER — ESOMEPRAZOLE MAGNESIUM 40 MG PO CPDR: DELAYED_RELEASE_CAPSULE | ORAL | Status: DC

## 2011-06-24 ENCOUNTER — Other Ambulatory Visit: Payer: Self-pay | Admitting: Gastroenterology

## 2011-09-13 ENCOUNTER — Ambulatory Visit (INDEPENDENT_AMBULATORY_CARE_PROVIDER_SITE_OTHER): Payer: No Typology Code available for payment source | Admitting: Gastroenterology

## 2011-09-13 ENCOUNTER — Encounter: Payer: Self-pay | Admitting: Gastroenterology

## 2011-09-13 VITALS — BP 130/68 | HR 72 | Ht 66.0 in | Wt 149.4 lb

## 2011-09-13 DIAGNOSIS — K219 Gastro-esophageal reflux disease without esophagitis: Secondary | ICD-10-CM

## 2011-09-13 DIAGNOSIS — Z85038 Personal history of other malignant neoplasm of large intestine: Secondary | ICD-10-CM

## 2011-09-13 MED ORDER — RABEPRAZOLE SODIUM 20 MG PO TBEC: 20.0000 mg | DELAYED_RELEASE_TABLET | Freq: Every day | ORAL | Status: DC

## 2011-09-13 NOTE — Patient Instructions (Signed)
You have been given samples of Aciphex.   Your physician has requested that you go to the basement for lab work before leaving today.  Dr. Jarold Motto would like to see you back next year (2013) for a colonoscopy follow up.  CC: [name].D.

## 2011-09-13 NOTE — Progress Notes (Signed)
This is a 76 year old Caucasian male with chronic acid reflux and a previous esophageal stricture that was dilated with endoscopy in May of 2011. Also at that time he had an inlet pouch noted in its proximal esophagus. He continues with acid reflux he does not take his daily AcipHex. Apparently he has found that vinegar seems to work well, and he uses this daily. He denies dysphagia, or other gastrointestinal symptoms. He has had several colonoscopies by Dr. Ovidio Kin for recurrent colon polyps and one polyp previously had carcinoma in situ. Specifically, he denies rectal bleeding or abdominal pain. Family history is apparently noncontributory.  Current Medications, Allergies, Past Medical History, Past Surgical History, Family History and Social History were reviewed in [employer] record.  Pertinent Review of Systems Negative   Physical Exam: Healthy-appearing male in no acute distress. Examination the oropharynx was difficult because of a very active gag reflex. However, I could not see any definite mucosal or soft palate lesions. I could not appreciate any lymphadenopathy, thyromegaly, or other neck abnormalities.    Assessment and Plan: Chronic GERD with previous peptic stricture dilated. He also has a thoracic inlet pouch of Barrett's mucosa. He  should take daily AcipHex 20 mg 30 minutes before breakfast. He is to return IFOB stool cards for examination. If these are negative, we will do followup colonoscopy in 3 years time. He also is to call should he develop dysphagia or worsening acid reflux symptoms. Otherwise he is to continue medications as listed and reviewed. I reviewed his outside records and colonoscopy reports in detail. Encounter Diagnoses  Name Primary?  . History of colon cancer Yes  . GERD (gastroesophageal reflux disease)

## 2011-09-23 ENCOUNTER — Other Ambulatory Visit: Payer: Self-pay | Admitting: Urology

## 2011-09-23 DIAGNOSIS — C61 Malignant neoplasm of prostate: Secondary | ICD-10-CM

## 2011-10-08 ENCOUNTER — Ambulatory Visit (HOSPITAL_COMMUNITY)
Admission: RE | Admit: 2011-10-08 | Discharge: 2011-10-08 | Disposition: A | Discharge status: Home / Self Care | Payer: No Typology Code available for payment source | Source: Ambulatory Visit | Attending: Urology | Admitting: Urology

## 2011-10-08 DIAGNOSIS — C61 Malignant neoplasm of prostate: Secondary | ICD-10-CM | POA: Insufficient documentation

## 2011-10-08 LAB — CREATININE, SERUM
Creatinine, Ser: 0.8 mg/dL (ref 0.50–1.35)
GFR calc non Af Amer: 86 mL/min — ABNORMAL LOW (ref >90)

## 2011-10-08 MED ORDER — GADOBENATE DIMEGLUMINE 529 MG/ML IV SOLN
15.0000 mL | Freq: Once | INTRAVENOUS | Status: AC | PRN
  Administered 2011-10-08: 13 mL via INTRAVENOUS

## 2011-10-28 ENCOUNTER — Emergency Department (HOSPITAL_COMMUNITY)
Admission: EM | Admit: 2011-10-28 | Discharge: 2011-10-28 | Disposition: A | Discharge status: Home / Self Care | Payer: Medicare Other | Attending: Emergency Medicine | Admitting: Emergency Medicine

## 2011-10-28 ENCOUNTER — Encounter (HOSPITAL_COMMUNITY): Payer: Self-pay | Admitting: *Deleted

## 2011-10-28 ENCOUNTER — Emergency Department (HOSPITAL_COMMUNITY): Payer: Medicare Other

## 2011-10-28 DIAGNOSIS — N419 Inflammatory disease of prostate, unspecified: Secondary | ICD-10-CM | POA: Insufficient documentation

## 2011-10-28 DIAGNOSIS — Z8546 Personal history of malignant neoplasm of prostate: Secondary | ICD-10-CM | POA: Insufficient documentation

## 2011-10-28 DIAGNOSIS — R5381 Other malaise: Secondary | ICD-10-CM | POA: Insufficient documentation

## 2011-10-28 DIAGNOSIS — K5900 Constipation, unspecified: Secondary | ICD-10-CM | POA: Insufficient documentation

## 2011-10-28 DIAGNOSIS — R5383 Other fatigue: Secondary | ICD-10-CM | POA: Insufficient documentation

## 2011-10-28 DIAGNOSIS — R42 Dizziness and giddiness: Secondary | ICD-10-CM | POA: Insufficient documentation

## 2011-10-28 DIAGNOSIS — R634 Abnormal weight loss: Secondary | ICD-10-CM | POA: Insufficient documentation

## 2011-10-28 HISTORY — DX: Inflammatory disease of prostate, unspecified: N41.9

## 2011-10-28 LAB — COMPREHENSIVE METABOLIC PANEL
ALT: 26 U/L (ref 0–53)
AST: 30 U/L (ref 0–37)
Alkaline Phosphatase: 72 U/L (ref 39–117)
BUN: 16 mg/dL (ref 6–23)
CO2: 26 mEq/L (ref 19–32)
Chloride: 96 mEq/L (ref 96–112)
Creatinine, Ser: 0.99 mg/dL (ref 0.50–1.35)
GFR calc non Af Amer: 79 mL/min — ABNORMAL LOW (ref >90)
Glucose, Bld: 111 mg/dL — ABNORMAL HIGH (ref 70–99)
Total Bilirubin: 0.3 mg/dL (ref 0.3–1.2)

## 2011-10-28 LAB — URINALYSIS, ROUTINE W REFLEX MICROSCOPIC
Glucose, UA: NEGATIVE mg/dL
Protein, ur: NEGATIVE mg/dL
pH: 7 (ref 5.0–8.0)

## 2011-10-28 LAB — DIFFERENTIAL
Basophils Relative: 0 % (ref 0–1)
Eosinophils Absolute: 0.1 10*3/uL (ref 0.0–0.7)
Eosinophils Relative: 0 % (ref 0–5)
Monocytes Relative: 17 % — ABNORMAL HIGH (ref 3–12)

## 2011-10-28 LAB — CBC
HCT: 42.7 % (ref 39.0–52.0)
Hemoglobin: 15.1 g/dL (ref 13.0–17.0)
RBC: 4.84 MIL/uL (ref 4.22–5.81)
RDW: 12.3 % (ref 11.5–15.5)

## 2011-10-28 LAB — OCCULT BLOOD, POC DEVICE: Fecal Occult Bld: NEGATIVE

## 2011-10-28 LAB — URINE MICROSCOPIC-ADD ON

## 2011-10-28 MED ORDER — SODIUM CHLORIDE 0.9 % IV BOLUS (SEPSIS): 1000.0000 mL | Freq: Once | INTRAVENOUS | Status: DC

## 2011-10-28 MED ORDER — SODIUM CHLORIDE 0.9 % IV SOLN
INTRAVENOUS | Status: DC
  Administered 2011-10-28: 13:00:00 via INTRAVENOUS

## 2011-10-28 NOTE — ED Notes (Signed)
Pt states "I called my doctor & they told me to come in, I have prostate cancer, have infected prostate, this has been going on x 3 wks"

## 2011-10-28 NOTE — ED Notes (Signed)
Pt to be d/c to home. States he is feeling much better. Educated ie increase of water intake to avoid constipation.Marland KitchenMarland KitchenMarland Kitchen

## 2011-10-28 NOTE — ED Provider Notes (Signed)
History     CSN: [id number]  Arrival date & time 10/28/11  1105   First MD Initiated Contact with Patient 10/28/11 1223      Chief Complaint  Patient presents with  . Weight Loss  . Weakness    (Consider location/radiation/quality/duration/timing/severity/associated sxs/prior treatment) Patient is a 76 y.o. male presenting with weakness. The history is provided by the patient. No language interpreter was used.  Weakness The primary symptoms include dizziness. Primary symptoms do not include headaches, syncope, loss of consciousness, altered mental status, seizures, visual change, paresthesias, focal weakness, loss of sensation, memory loss, fever, nausea or vomiting. The symptoms began more than 1 week ago. The symptoms are unchanged. The symptoms occurred after standing up.  Dizziness also occurs with weakness. Dizziness does not occur with tinnitus, nausea or vomiting.  Additional symptoms include weakness and loss of balance. Additional symptoms do not include pain, lower back pain, tinnitus, anxiety or irritability. Medical issues also include cancer and alcohol use. Medical issues do not include seizures, cerebral vascular accident, drug use, diabetes, hypertension or recent surgery.   Patient is here today complaining of general weakness and some dizziness with ambulating. States that he has been treated for prostate cancer. States that he is on antibiotics for this and goes to Dr. doll stat. They have been treated for 3 weeks so far. He's also been constipated for the last week and then he ended up taking 3 bottles of magnesium citrate. Had a good bowel movement last night but thinks there is some conflict blockage". He also states that he has lost 12 pounds in the last month. If his pain is under control at home with pain medications he does drink 2 drinks a day. Appears to be deconditioned. She is at the bedside also.  Also states that he thinks there might be blood in his  stool. Past Medical History  Diagnosis Date  . Barrett's esophagus   . Herpes   . Cancer     prostate  . Gout   . Prostatitis     Past Surgical History  Procedure Date  . Refractive surgery   . Intracapsular cataract extraction     Lt eye    Family History  Problem Relation Age of Onset  . Colon cancer Neg Hx   . Cirrhosis Mother     Liver disease    History  Substance Use Topics  . Smoking status: Former Games developer  . Smokeless tobacco: Never Used   Comment: Stopped in 1963  . Alcohol Use: Yes     2 drinks nightly      Review of Systems  Constitutional: Negative for fever and irritability.  HENT: Negative for tinnitus.   Cardiovascular: Negative for syncope.  Gastrointestinal: Positive for constipation. Negative for nausea, vomiting, diarrhea and blood in stool.  Neurological: Positive for dizziness, weakness and loss of balance. Negative for focal weakness, seizures, loss of consciousness, headaches and paresthesias.  Psychiatric/Behavioral: Negative for memory loss and altered mental status.  All other systems reviewed and are negative.    Allergies  Review of patient's allergies indicates no known allergies.  Home Medications   Current Outpatient Rx  Name Route Sig Dispense Refill  . ALLOPURINOL 100 MG PO TABS Oral Take 100 mg by mouth daily.      . ASPIRIN 81 MG PO TABS Oral Take 81 mg by mouth daily.      . CYCLOSPORINE 0.05 % OP EMUL  1 drop 2 (two) times daily. As needed.     Marland Kitchen  ESOMEPRAZOLE MAGNESIUM 40 MG PO CPDR  Take one tablet by mouth once a day..... MUST HAVE OFFICE VISIT 30 capsule 0  . PROSTATE HEALTH PO CAPS Oral Take 1 capsule by mouth daily.      . MULTI-VITAMIN/MINERALS PO TABS Oral Take 1 tablet by mouth daily.      Marland Kitchen RABEPRAZOLE SODIUM 20 MG PO TBEC Oral Take 1 tablet (20 mg total) by mouth daily. 30 tablet 1  . VALACYCLOVIR HCL 1 G PO TABS Oral Take 1,000 mg by mouth daily. 1/2 tab daily.    Marland Kitchen TADALAFIL 20 MG PO TABS Oral Take 20 mg by  mouth daily as needed. As needed.       BP 121/69  Pulse 81  Temp(Src) 99.6 F (37.6 C) (Oral)  Resp 16  Wt 138 lb (62.596 kg)  SpO2 96%  Physical Exam  Nursing note and vitals reviewed. Constitutional: He is oriented to person, place, and time. He appears well-developed and well-nourished. No distress.  Eyes: Pupils are equal, round, and reactive to light.  Neck: Normal range of motion.  Cardiovascular: Normal rate.  Exam reveals no gallop.   No murmur heard.      Distant heart sounds  Pulmonary/Chest: Effort normal and breath sounds normal. No respiratory distress. He has no wheezes.  Abdominal: Bowel sounds are normal. He exhibits no distension. There is no tenderness.  Genitourinary: Rectal exam shows tenderness. Rectal exam shows no mass. Prostate is tender.  Musculoskeletal: Normal range of motion. He exhibits no edema and no tenderness.  Neurological: He is alert and oriented to person, place, and time.  Skin: Skin is warm and dry.  Psychiatric: He has a normal mood and affect.    ED Course  Procedures (including critical care time)  Labs Reviewed  CBC - Abnormal; Notable for the following:    WBC 13.6 (*)    All other components within normal limits  DIFFERENTIAL - Abnormal; Notable for the following:    Neutro Abs 9.9 (*)    Lymphocytes Relative 10 (*)    Monocytes Relative 17 (*)    Monocytes Absolute 2.2 (*)    All other components within normal limits  COMPREHENSIVE METABOLIC PANEL - Abnormal; Notable for the following:    Sodium 133 (*)    Glucose, Bld 111 (*)    Albumin 3.4 (*)    GFR calc non Af Amer 79 (*)    All other components within normal limits  URINALYSIS, ROUTINE W REFLEX MICROSCOPIC - Abnormal; Notable for the following:    APPearance CLOUDY (*)    Hgb urine dipstick TRACE (*)    Ketones, ur TRACE (*)    Leukocytes, UA MODERATE (*)    All other components within normal limits  URINE MICROSCOPIC-ADD ON - Abnormal; Notable for the following:     Bacteria, UA FEW (*)    All other components within normal limits  OCCULT BLOOD, POC DEVICE  URINE CULTURE   Dg Abd Acute W/chest  10/28/2011  *RADIOLOGY REPORT*  Clinical Data: Constipation.  Weight loss.  ACUTE ABDOMEN SERIES (ABDOMEN 2 VIEW & CHEST 1 VIEW)  Comparison:  Chest radiograph on 07/17/2004  Findings:  There is no evidence of dilated bowel loops or free intraperitoneal air.  No radiopaque calculi or other significant radiographic abnormality is seen. Pelvic phleboliths noted.  Heart size and mediastinal contours are within normal limits.  Both lungs are clear.  IMPRESSION: Negative abdominal radiographs.  No acute cardiopulmonary disease.  Original Report Authenticated  By: Danae Orleans, M.D.     No diagnosis found.    MDM   Patient presents general weakness constipation and weight loss of 12 pounds over the past month. He is also being treated for prostate infection per Dr. Narda Bonds.  PCP is Dr. Juline Patch at [facility] urgent care.  Recent dx of prostate cancer.  Pain on exam to prostate but is not hot.  Will continue antibiotics and return for follow up this week with [place].  Labs Reviewed  CBC - Abnormal; Notable for the following:    WBC 13.6 (*)    All other components within normal limits  DIFFERENTIAL - Abnormal; Notable for the following:    Neutro Abs 9.9 (*)    Lymphocytes Relative 10 (*)    Monocytes Relative 17 (*)    Monocytes Absolute 2.2 (*)    All other components within normal limits  COMPREHENSIVE METABOLIC PANEL - Abnormal; Notable for the following:    Sodium 133 (*)    Glucose, Bld 111 (*)    Albumin 3.4 (*)    GFR calc non Af Amer 79 (*)    All other components within normal limits  URINALYSIS, ROUTINE W REFLEX MICROSCOPIC - Abnormal; Notable for the following:    APPearance CLOUDY (*)    Hgb urine dipstick TRACE (*)    Ketones, ur TRACE (*)    Leukocytes, UA MODERATE (*)    All other components within normal limits  URINE MICROSCOPIC-ADD ON -  Abnormal; Notable for the following:    Bacteria, UA FEW (*)    All other components within normal limits  OCCULT BLOOD, POC DEVICE  URINE CULTURE        Jethro Bastos, NP 10/28/11 2204

## 2011-10-28 NOTE — ED Notes (Signed)
ED NP at bedside

## 2011-10-30 ENCOUNTER — Telehealth: Payer: Self-pay | Admitting: Gastroenterology

## 2011-10-30 LAB — URINE CULTURE
Colony Count: NO GROWTH
Culture: NO GROWTH

## 2011-10-30 NOTE — Telephone Encounter (Signed)
Pt reports abd pain, weakness and constipation since his 09/13/11 OV with Dr Jarold Motto. He went to the ER Christmas Eve for weight loss and weakness as well as a new onset of constipation. Basically, nothing was found in the ER and pt was instructed to f/u with Dr Retta Diones; he informed ER staff he is being tx for prostate infection along with constipation. Pt seen 09/13/11 by Dr Jarold Motto; he was to do FOBT and then COLON would be done now or in 3 years depending on the result. Pt reports he did not do the cards because he knew they would show blood d/t the prostate; I cancelled the FOBT's today. An MRI was done after the prostate bx and he was placed on antibiotics. He states the constipation started after the bx; now the only way he can go is with Magnesium Citrate. He c/o abdominal pain below the waist on both sides. Pt was given an appt with Willette Cluster, NP tomorrow at 1:30pm.

## 2011-10-31 ENCOUNTER — Encounter: Payer: Self-pay | Admitting: Nurse Practitioner

## 2011-10-31 ENCOUNTER — Ambulatory Visit (INDEPENDENT_AMBULATORY_CARE_PROVIDER_SITE_OTHER): Payer: Medicare Other

## 2011-10-31 ENCOUNTER — Telehealth: Payer: Self-pay | Admitting: *Deleted

## 2011-10-31 ENCOUNTER — Ambulatory Visit (INDEPENDENT_AMBULATORY_CARE_PROVIDER_SITE_OTHER): Payer: No Typology Code available for payment source | Admitting: Internal Medicine

## 2011-10-31 DIAGNOSIS — K921 Melena: Secondary | ICD-10-CM

## 2011-10-31 DIAGNOSIS — R198 Other specified symptoms and signs involving the digestive system and abdomen: Secondary | ICD-10-CM

## 2011-10-31 DIAGNOSIS — R634 Abnormal weight loss: Secondary | ICD-10-CM

## 2011-10-31 DIAGNOSIS — K6289 Other specified diseases of anus and rectum: Secondary | ICD-10-CM

## 2011-10-31 DIAGNOSIS — C61 Malignant neoplasm of prostate: Secondary | ICD-10-CM

## 2011-10-31 DIAGNOSIS — R194 Change in bowel habit: Secondary | ICD-10-CM

## 2011-10-31 MED ORDER — PEG-KCL-NACL-NASULF-NA ASC-C 100 G PO SOLR: ORAL | Status: DC

## 2011-10-31 MED ORDER — ESOMEPRAZOLE MAGNESIUM 40 MG PO CPDR: 40.0000 mg | DELAYED_RELEASE_CAPSULE | Freq: Every day | ORAL | Status: DC

## 2011-10-31 NOTE — Telephone Encounter (Signed)
Pt saw Dr Cleta Alberts this am and Dr Cleta Alberts suspects pt may have COLON CA. In ER pt had Heme - stools, but this am the stools were Heme +, but brown in color. Dr Cleta Alberts states the pt looks like someone who's chronically ill. Dr Cleta Alberts will fax over his office notes and labs from this am.

## 2011-10-31 NOTE — Progress Notes (Addendum)
[name] [id number] [birth year]   HISTORY OR PRESENT ILLNESS :  Patient is a 76 year old male known to Dr. Jarold Motto for his of GERD complicated by peptic strictures requiring dilation. Patient also has a history of colon polyps including a distal sigmoid adenocarcinoma in situ in 2005. He is followed by Dr. Ezzard Standing (surgery) who has done at least 3 surveillance lower endoscopies since 2005. Patient's last colonoscopy was in May 2010 and it was completely normal. A repeat colonoscopy was recommended at 5 year mark. Patient saw Dr. Jarold Motto for GERD related symptoms early last month. At that visit Dr. Jarold Motto discussed patient's history of colon polyps Plan was for interval screening with IFOB stool cards. If negative then surveillance colonoscopy would be done in 3 years (at 5 year mark from last colonoscopy in 2010).   Since patient's visit here early last month patient had a prostate biopsy and has been diagnosed prostate cancer (Gleason grade 3+3 =6). He comes in today with rectal pain which started a few days after biopsy. The pain is mainly when standing or sitting, feels okay when lying down. Patient saw urologist and has been treated with three different rounds of antibiotics for presumed prostatitis. An MR of the prostate done 10/08/11 showed extensive biopsy hemorrhage.  Patient went ER on 12/24 with dizziness, weakness and recent constipation. His WBC was elevated at 13.6, hemoglobin was normal at 15.1. Urine showed some leukocytes and bacteria but culture is negative. Abdominal series was negative. Patient went to PCP this am for ongoing discomfort. In office patient has Hemosure positive. PCP called our office this am requesting patient been seen for a colonoscopy to rule out colon cancer. Patient hasn't had any overt GI bleeding. Denies abdominal pain. He has recently lost about 14 pounds for unclear reasons. No nausea or early satiety, just poor appetite. No dysuria but some blood has been  seen in his semen. Patient reports fevers at home.   Current Medications, Allergies, Past Medical History, Past Surgical History, Family History and Social History were reviewed in [employer] record.   PHYSICAL EXAMINATION : General: Well developed white male in no acute distress Head: Normocephalic and atraumatic Eyes:  sclerae anicteric,conjunctive pink. Ears: Normal auditory acuity Neck: Supple, no masses.  Lungs: Clear throughout to auscultation Heart: Regular rate and rhythm Abdomen: Soft, nondistended, nontender. No masses or hepatomegaly noted. Normal bowel sounds Rectal: No external lesions. Some internal hemorrhoids were seen on anoscopy. Additionally there was a tiny red spot seen on anterior wall. Area was not bleeding or oozing. No masses felt.  Musculoskeletal: Symmetrical with no gross deformities  Neurological: Oriented x 4, grossly nonfocal Cervical Nodes:  No significant cervical adenopathy Psychological:  Alert and cooperative. Normal mood and affect  ASSESSMENT AND PLAN :  1.  Rectal pain - occurs mainly in standing and sitting position. Pain started a few days after prostate biopsy mid November. Patient has seen urology and treated with 3 different antibiotics for presumed prostatitis. His recent urine culture is negative. Patient did have an MR of prostate on 12/14 which showed extensive biopsy hemorrhage but I spoke with urology today and they don't feel rectal pain is related to procedure. His exam is negative for a fissure. Cause of ongoing pain not clear but colon pathology seems unlikely. Further evaluation at time of surveillance colonoscopy on 11/12/10. 2. Unintentional weight loss of 14 pounds recently. No abdominal pain, nausea, or early satiety, mainly just a poor appetite. If colonoscopy is  negative patient may need CTscan for further evaluation of weight loss. 3.  Change in bowel habits. Patient recently constipated for unclear reasons.  He did take Mg Citrate and had adequate bowel movement yesterday. No stool in vault on exam. abdominal exam not concerning for obstructive process. 4. History of colon polyps / carcinoma in situ. Current plan was for surveillance colonoscopy in 3 years if his recent IFOB was negative. Patient didn't do IFOB as he thought it would be positive after prostate biopsy last month. He did have a positive Hemosure at PCP's office today. On my exam his stool is brown, heme negative. Patient had a normal colonoscopy May 2010 so colon cancer is not likely but given weight loss and bowel change we will proceed with surveillance colonoscopy on 11/13/11. The risks, benefits, and alternatives to colonoscopy with possible biopsy and possible polypectomy were discussed with the patient and he consents to proceed.  5. Prostate Cancer (Gleason 3+3+6), recently diagnosed. Patient will be seeing urology back to discuss whether treatment even necessary 6. GERD. History of peptic strictures requiring dilation. Currently no dysphagia. As of late he has been taking Vinegar pills, Aciphex doesn't work as well as Nexium. I will give him a prescription for Nexium to take 30 minutes prior to breakfast.   Reviewed and agree with management. Rectal pain unclear, but given temporal relationship to prostate bx and imaging results, this is felt more likely than rectal/colonic source Agree with proceeding with colonoscopy, as he is due for surveillance.  Also will further investigate wt loss. GERD - Nexium Will f/u with Dr. Jarold Motto for colonoscopy and then in office thereafter as necessary.

## 2011-10-31 NOTE — Patient Instructions (Signed)
We have schedueld the colonoscopy with Dr. Sheryn Bison for 11-12-2010. Willette Cluster ACNP will talk to Dr Jarold Motto to see if we can get it done any sooner.  We sent a prescription for the colonoscopy prep to [facility] pharmacy. We have given you a rebate coupon for $20.00 off.

## 2011-11-03 NOTE — ED Provider Notes (Signed)
Medical screening examination/treatment/procedure(s) were performed by non-physician practitioner and as supervising physician I was immediately available for consultation/collaboration.  Raeford Razor, MD 11/03/11 [phone]

## 2011-11-06 ENCOUNTER — Telehealth: Payer: Self-pay | Admitting: *Deleted

## 2011-11-06 NOTE — Telephone Encounter (Signed)
I spoke to the patient and he is no longer having the rectal pain when sitting or standing.  He is also not having a problem with constipation.  I advised we have not been able to get him in sooner for his colonoscopy. He said that was okay with having it on 11-12-2010.  He said if we get a cancellation we can call him if we give him enough notice.

## 2011-11-06 NOTE — Telephone Encounter (Signed)
I looked at the [facility] schedule for Friday 11-07-2010 and there is an opening now however it is not a propofol day.  Per Harlow Mares CMA,  There is no propofol on that day. Marland Kitchen

## 2011-11-11 DIAGNOSIS — C61 Malignant neoplasm of prostate: Secondary | ICD-10-CM | POA: Diagnosis not present

## 2011-11-11 DIAGNOSIS — N486 Induration penis plastica: Secondary | ICD-10-CM | POA: Diagnosis not present

## 2011-11-11 DIAGNOSIS — N419 Inflammatory disease of prostate, unspecified: Secondary | ICD-10-CM | POA: Diagnosis not present

## 2011-11-11 DIAGNOSIS — N529 Male erectile dysfunction, unspecified: Secondary | ICD-10-CM | POA: Diagnosis not present

## 2011-11-13 ENCOUNTER — Encounter: Payer: Self-pay | Admitting: Gastroenterology

## 2011-11-13 ENCOUNTER — Ambulatory Visit (AMBULATORY_SURGERY_CENTER): Payer: Medicare Other | Admitting: Gastroenterology

## 2011-11-13 DIAGNOSIS — Z860100 Personal history of colon polyps, unspecified: Secondary | ICD-10-CM | POA: Insufficient documentation

## 2011-11-13 DIAGNOSIS — R634 Abnormal weight loss: Secondary | ICD-10-CM | POA: Diagnosis not present

## 2011-11-13 DIAGNOSIS — R198 Other specified symptoms and signs involving the digestive system and abdomen: Secondary | ICD-10-CM | POA: Insufficient documentation

## 2011-11-13 DIAGNOSIS — K6289 Other specified diseases of anus and rectum: Secondary | ICD-10-CM | POA: Insufficient documentation

## 2011-11-13 DIAGNOSIS — K219 Gastro-esophageal reflux disease without esophagitis: Secondary | ICD-10-CM | POA: Diagnosis not present

## 2011-11-13 MED ORDER — SODIUM CHLORIDE 0.9 % IV SOLN: 500.0000 mL | INTRAVENOUS | Status: DC

## 2011-11-13 NOTE — Progress Notes (Signed)
Patient did not experience any of the following events: a burn prior to discharge; a fall within the facility; wrong site/side/patient/procedure/implant event; or a hospital transfer or hospital admission upon discharge from the facility. (G8907) Patient did not have preoperative order for IV antibiotic SSI prophylaxis. (G8918)  

## 2011-11-13 NOTE — Op Note (Signed)
[facility] [address] [place], [place]  [address]  COLONOSCOPY PROCEDURE REPORT  PATIENT:  [name], [name]  MR#:  [id number] BIRTHDATE:  02/20/1935, 76 yrs. old  GENDER:  male ENDOSCOPIST:  Vania Rea. Jarold Motto, MD, [facility] REF. BY:  Lesle Chris, M.D. PROCEDURE DATE:  11/13/2011 PROCEDURE:  Diagnostic Colonoscopy ASA CLASS:  Class II INDICATIONS:  weight loss, history of pre-cancerous (adenomatous) colon polyps MEDICATIONS:   propofol (Diprivan) 150 mg IV  DESCRIPTION OF PROCEDURE:   After the risks and benefits and of the procedure were explained, informed consent was obtained. Digital rectal exam was performed and revealed no abnormalities. The LB [id number] endoscope was introduced through the anus and advanced to the cecum, which was identified by both the appendix and ileocecal valve.  The quality of the prep was excellent, using MoviPrep.  The instrument was then slowly withdrawn as the colon was fully examined. <<PROCEDUREIMAGES>>  FINDINGS:  There were mild diverticular changes in left colon. diverticulosis was found.  Mucosal abnormality was found. NODULARITY IN RECTUM C/W RECENT TRANSRECTAL BX. OF PROSTATE.SCARRING IN RECTUM FROM PRIOR POLYPECTOMIES ALSO NOTED.SEE PICTURES.  No polyps or cancers were seen.  This was otherwise a normal examination of the colon.   Retroflexion was not performed.  The scope was then withdrawn from the patient and the procedure completed.  COMPLICATIONS:  None ENDOSCOPIC IMPRESSION: 1) Diverticulosis,mild,left sided diverticulosis 2) Mucosal abnormality 3) No polyps or cancers 4) Otherwise normal examination RECOMMENDATIONS: REASSURANCE,F/U 5 YEARS.  REPEAT EXAM:  No  ______________________________ Vania Rea. Jarold Motto, MD, Clementeen Graham  CC:  Christiana Fuchs MD  n. Rosalie DoctorVania Rea. Patterson at 11/13/2011 04:33 PM  [name], [name], [phone]

## 2011-11-13 NOTE — Patient Instructions (Signed)
FOLLOW THE INSTRUCTIONS ON THE GREEN AND BLUE INSTRUCTION SHEETS.  CONTINUE YOUR MEDICATIONS.  NEXT COLONOSCOPY 5 YEARS.

## 2011-11-14 ENCOUNTER — Telehealth: Payer: Self-pay

## 2011-11-14 NOTE — Telephone Encounter (Signed)

## 2011-11-19 ENCOUNTER — Encounter (INDEPENDENT_AMBULATORY_CARE_PROVIDER_SITE_OTHER): Payer: Medicare Other | Admitting: Emergency Medicine

## 2011-11-19 DIAGNOSIS — L910 Hypertrophic scar: Secondary | ICD-10-CM

## 2011-11-19 DIAGNOSIS — Z0000 Encounter for general adult medical examination without abnormal findings: Secondary | ICD-10-CM | POA: Diagnosis not present

## 2011-11-19 DIAGNOSIS — M109 Gout, unspecified: Secondary | ICD-10-CM

## 2011-11-19 DIAGNOSIS — C61 Malignant neoplasm of prostate: Secondary | ICD-10-CM

## 2011-12-03 ENCOUNTER — Other Ambulatory Visit: Payer: Self-pay | Admitting: Emergency Medicine

## 2011-12-11 ENCOUNTER — Encounter: Payer: Self-pay | Admitting: Family Medicine

## 2011-12-11 ENCOUNTER — Ambulatory Visit (INDEPENDENT_AMBULATORY_CARE_PROVIDER_SITE_OTHER): Payer: Medicare Other | Admitting: Family Medicine

## 2011-12-11 VITALS — BP 110/66 | HR 72 | Temp 98.0°F | Resp 16 | Ht 65.5 in | Wt 152.0 lb

## 2011-12-11 DIAGNOSIS — H109 Unspecified conjunctivitis: Secondary | ICD-10-CM

## 2011-12-11 DIAGNOSIS — M109 Gout, unspecified: Secondary | ICD-10-CM

## 2011-12-11 DIAGNOSIS — C61 Malignant neoplasm of prostate: Secondary | ICD-10-CM | POA: Diagnosis not present

## 2011-12-11 LAB — URIC ACID: Uric Acid, Serum: 5.6 mg/dL (ref 4.0–7.8)

## 2011-12-11 MED ORDER — CIPROFLOXACIN HCL 0.3 % OP SOLN: 1.0000 [drp] | OPHTHALMIC | Status: DC

## 2011-12-11 MED ORDER — CIPROFLOXACIN HCL 0.3 % OP SOLN: 1.0000 [drp] | OPHTHALMIC | Status: AC

## 2011-12-11 NOTE — Progress Notes (Signed)
  Subjective:    Patient ID: [name], male    DOB: 1935/06/10, 77 y.o.   MRN: [id number]  HPI 77 yo male with concern for pink eye. Exposed to pink eye through grandchildren.  Woke up with matting in right eye, itches.  Vision seems normal. No fever or runny nose, cough, sore throat.    Also - history of gout.  Has decreased allopurinol.  Uric acid not checked at recent physical.  Would like it checked to see if uric acid remaining at goal.     Review of Systems Negative except as per HPI     Objective:   Physical Exam  Constitutional: He appears well-developed and well-nourished.  Neurological: He is alert.    Mild injection right conjunctiva with watering      Assessment & Plan:  Conjunctivitis - ciloxan  Gout - check uric acid

## 2011-12-18 DIAGNOSIS — H409 Unspecified glaucoma: Secondary | ICD-10-CM | POA: Diagnosis not present

## 2011-12-18 DIAGNOSIS — H401290 Low-tension glaucoma, unspecified eye, stage unspecified: Secondary | ICD-10-CM | POA: Diagnosis not present

## 2011-12-18 DIAGNOSIS — Z961 Presence of intraocular lens: Secondary | ICD-10-CM | POA: Diagnosis not present

## 2011-12-18 DIAGNOSIS — H04129 Dry eye syndrome of unspecified lacrimal gland: Secondary | ICD-10-CM | POA: Diagnosis not present

## 2012-01-10 DIAGNOSIS — C61 Malignant neoplasm of prostate: Secondary | ICD-10-CM | POA: Diagnosis not present

## 2012-01-17 DIAGNOSIS — N486 Induration penis plastica: Secondary | ICD-10-CM | POA: Diagnosis not present

## 2012-01-17 DIAGNOSIS — N529 Male erectile dysfunction, unspecified: Secondary | ICD-10-CM | POA: Diagnosis not present

## 2012-01-17 DIAGNOSIS — C61 Malignant neoplasm of prostate: Secondary | ICD-10-CM | POA: Diagnosis not present

## 2012-01-17 DIAGNOSIS — R972 Elevated prostate specific antigen [PSA]: Secondary | ICD-10-CM | POA: Diagnosis not present

## 2012-02-14 DIAGNOSIS — H01009 Unspecified blepharitis unspecified eye, unspecified eyelid: Secondary | ICD-10-CM | POA: Diagnosis not present

## 2012-03-06 ENCOUNTER — Other Ambulatory Visit: Payer: Self-pay

## 2012-03-06 DIAGNOSIS — L0390 Cellulitis, unspecified: Secondary | ICD-10-CM | POA: Diagnosis not present

## 2012-03-06 DIAGNOSIS — L0291 Cutaneous abscess, unspecified: Secondary | ICD-10-CM | POA: Diagnosis not present

## 2012-03-06 DIAGNOSIS — L723 Sebaceous cyst: Secondary | ICD-10-CM | POA: Diagnosis not present

## 2012-03-18 DIAGNOSIS — L723 Sebaceous cyst: Secondary | ICD-10-CM | POA: Diagnosis not present

## 2012-04-02 DIAGNOSIS — L723 Sebaceous cyst: Secondary | ICD-10-CM | POA: Diagnosis not present

## 2012-04-02 DIAGNOSIS — L910 Hypertrophic scar: Secondary | ICD-10-CM | POA: Diagnosis not present

## 2012-05-20 DIAGNOSIS — C61 Malignant neoplasm of prostate: Secondary | ICD-10-CM | POA: Diagnosis not present

## 2012-05-28 DIAGNOSIS — N400 Enlarged prostate without lower urinary tract symptoms: Secondary | ICD-10-CM | POA: Diagnosis not present

## 2012-05-28 DIAGNOSIS — N529 Male erectile dysfunction, unspecified: Secondary | ICD-10-CM | POA: Diagnosis not present

## 2012-05-28 DIAGNOSIS — C61 Malignant neoplasm of prostate: Secondary | ICD-10-CM | POA: Diagnosis not present

## 2012-06-02 ENCOUNTER — Other Ambulatory Visit: Payer: Self-pay | Admitting: Gastroenterology

## 2012-06-16 DIAGNOSIS — H401290 Low-tension glaucoma, unspecified eye, stage unspecified: Secondary | ICD-10-CM | POA: Diagnosis not present

## 2012-06-16 DIAGNOSIS — Z961 Presence of intraocular lens: Secondary | ICD-10-CM | POA: Diagnosis not present

## 2012-06-16 DIAGNOSIS — H409 Unspecified glaucoma: Secondary | ICD-10-CM | POA: Diagnosis not present

## 2012-06-18 IMAGING — CR DG ABDOMEN ACUTE W/ 1V CHEST
4 series · 4 of 4 positions shown · non-contrast
Comparison: Chest radiograph on 07/17/2004

CLINICAL DATA: Constipation.  Weight loss.

ACUTE ABDOMEN SERIES (ABDOMEN 2 VIEW & CHEST 1 VIEW)

[w chest pa]
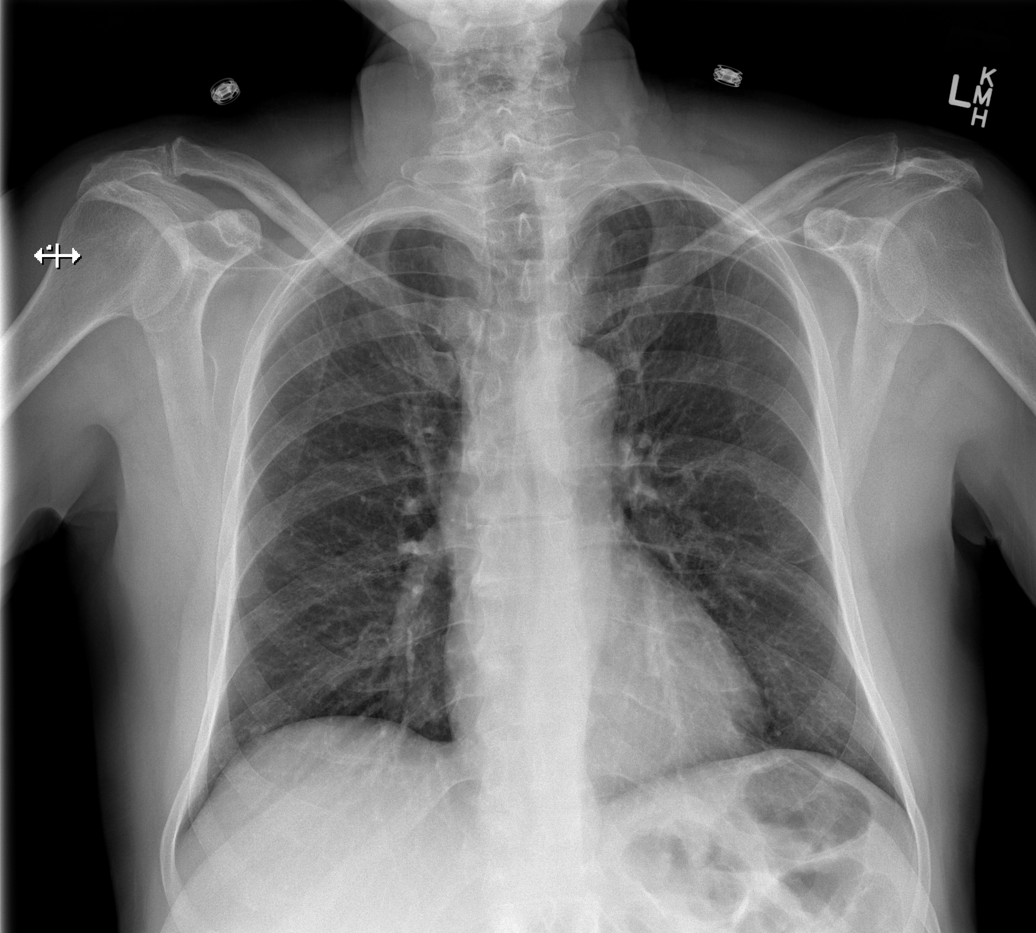

[w abdomen upright]
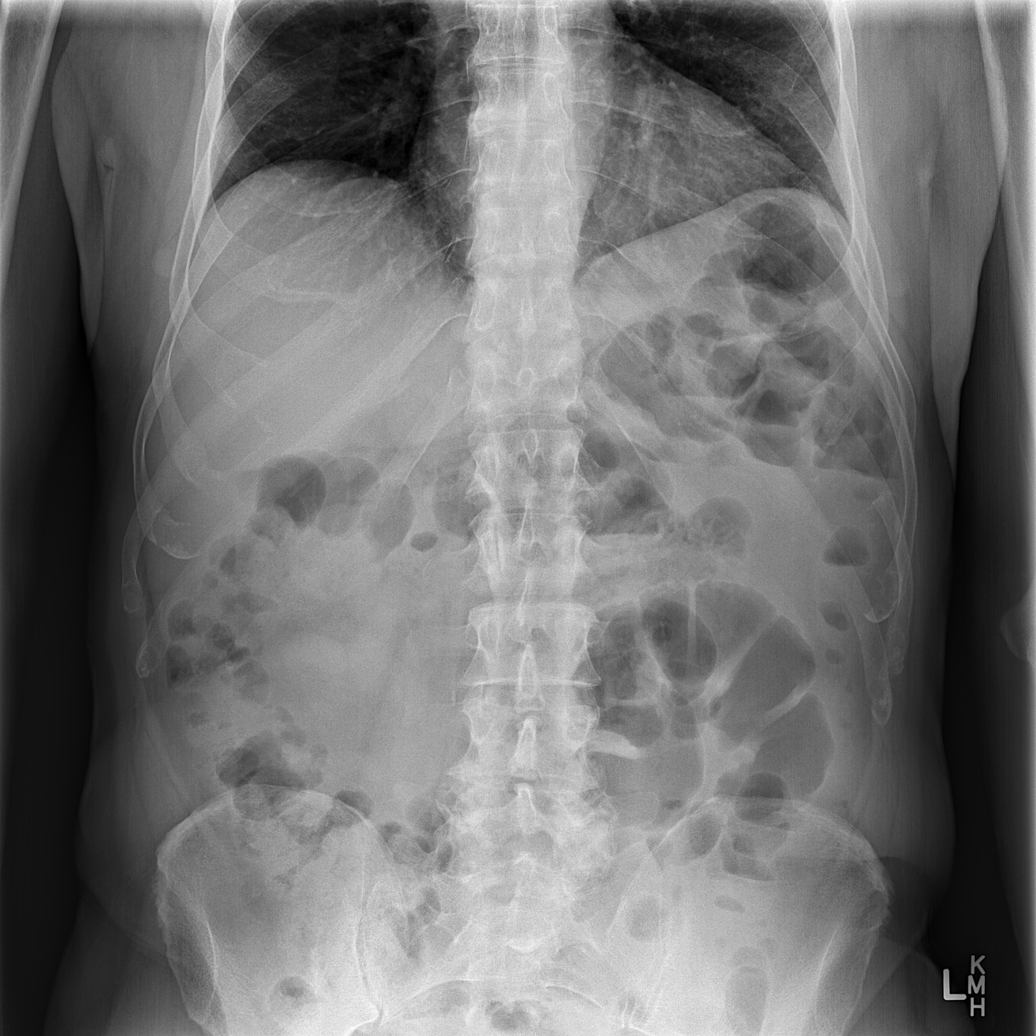

[t abdomen supine (1 of 2)]
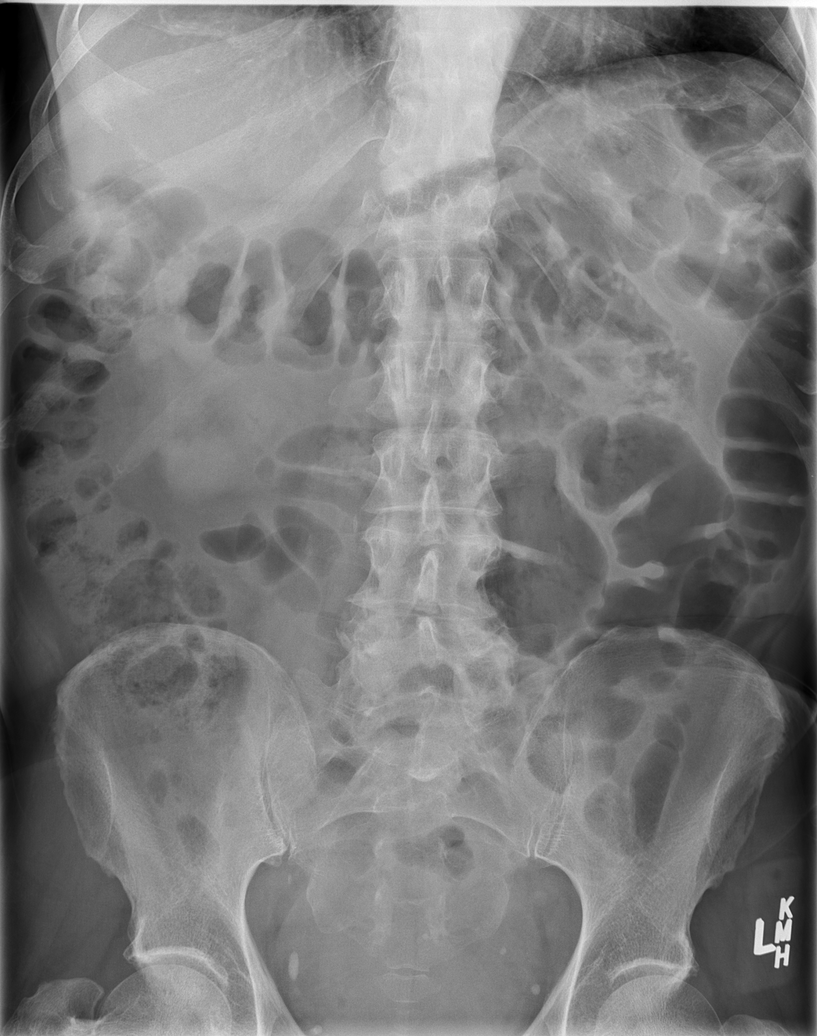

[t abdomen supine (2 of 2)]
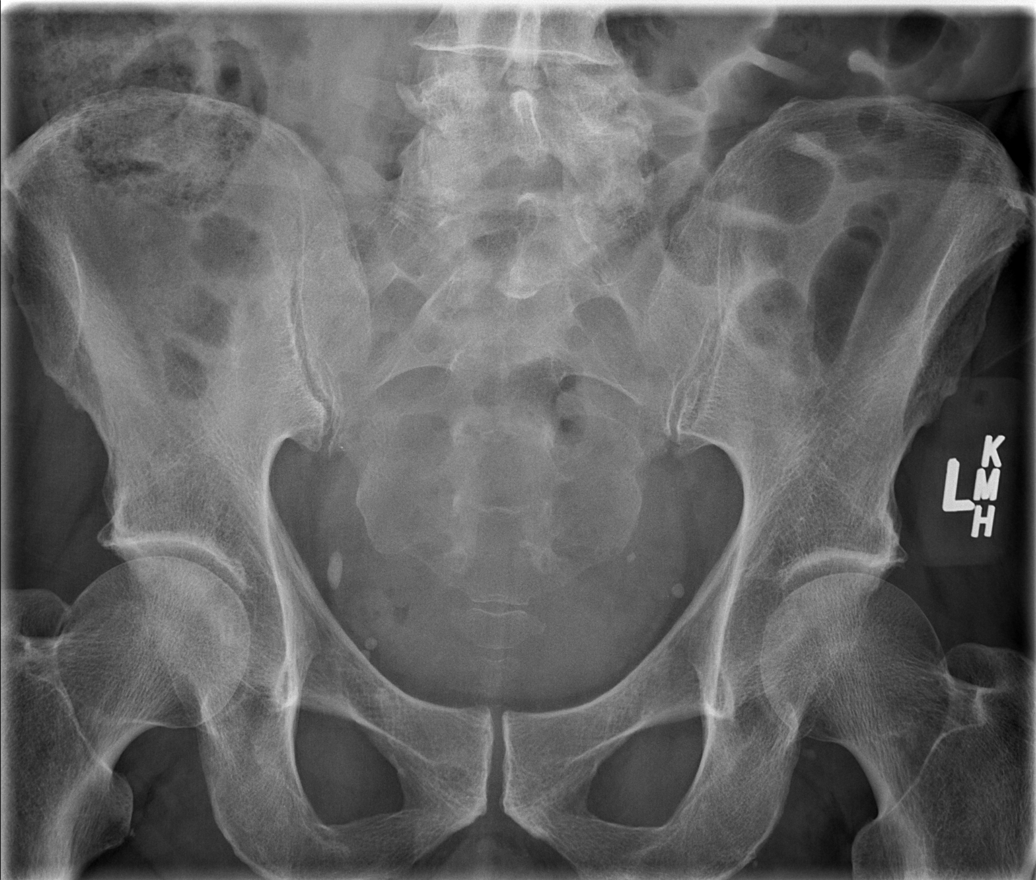

[4 of 4 positions shown; findings below may reference images not displayed]

FINDINGS: There is no evidence of dilated bowel loops or free
intraperitoneal air.  No radiopaque calculi or other significant
radiographic abnormality is seen. Pelvic phleboliths noted.

Heart size and mediastinal contours are within normal limits.  Both
lungs are clear.
IMPRESSION: Negative abdominal radiographs.  No acute cardiopulmonary disease.

## 2012-07-08 ENCOUNTER — Telehealth: Payer: Self-pay

## 2012-07-08 DIAGNOSIS — I639 Cerebral infarction, unspecified: Secondary | ICD-10-CM

## 2012-07-08 NOTE — Telephone Encounter (Signed)
Pt is wanting to get a referral to dr love office   Please call [phone]

## 2012-07-09 NOTE — Telephone Encounter (Signed)
Patient has had CVA in the past, he is fine now. OKAY per Dr Cleta Alberts to make referral. This is done.

## 2012-07-09 NOTE — Telephone Encounter (Signed)
I have called patient, what does he need referral for? Patient states he has not seen Dr Sandria Manly in 3 yrs and is due for follow up, but needs referral. Will pull chart and see what this is for. Patient states he is having no problems, just needs follow up

## 2012-07-16 ENCOUNTER — Encounter: Payer: Self-pay | Admitting: Gastroenterology

## 2012-07-16 ENCOUNTER — Ambulatory Visit (INDEPENDENT_AMBULATORY_CARE_PROVIDER_SITE_OTHER): Payer: Medicare Other | Admitting: Gastroenterology

## 2012-07-16 VITALS — BP 126/70 | HR 76 | Ht 65.5 in | Wt 149.1 lb

## 2012-07-16 DIAGNOSIS — K219 Gastro-esophageal reflux disease without esophagitis: Secondary | ICD-10-CM

## 2012-07-16 DIAGNOSIS — K5730 Diverticulosis of large intestine without perforation or abscess without bleeding: Secondary | ICD-10-CM

## 2012-07-16 MED ORDER — ESOMEPRAZOLE MAGNESIUM 40 MG PO CPDR: 40.0000 mg | DELAYED_RELEASE_CAPSULE | Freq: Every day | ORAL | Status: DC

## 2012-07-16 NOTE — Progress Notes (Signed)
This is a 77 year old Caucasian male with chronic acid reflux doing well on Nexium 40 mg a day. Recent endoscopy has not shown any evidence of Barrett's mucosa. He does have a thoracic inlet patch in his esophagus. He is up-to-date on his colonoscopy exams, and denies lower GI or hepatobiliary complaints. His appetite is good and his weight is stable. He is followed by Dr. Cleta Alberts , and relates his yearly labs have been normal. He currently denies burning substernal chest pain, regurgitation, would dysphagia.  Current Medications, Allergies, Past Medical History, Past Surgical History, Family History and Social History were reviewed in [employer] record.  Pertinent Review of Systems Negative   : Physical Exam: Healthy-appearing patient in no distress. Blood pressure 126/70, pulse 76 and regular, and weight 149 pounds with a BMI of 24.44. I cannot appreciate stigmata of chronic liver disease or thyromegaly. Patient does have vitiligo. Chest is clear and he did a regular rhythm without murmurs gallops or rubs. I cannot appreciate hepatosplenomegaly, abdominal masses or tenderness. Bowel sounds are normal. There is no peripheral edema, phlebitis, and mental status is normal.   Assessment and Plan: Chronic GERD doing well on daily PPI therapy. Have reviewed her reflux regime with this patient and have renewed his Nexium. He is due for followup colonoscopy as per clinical protocol. Otherwise I've advised him to continue medications as listed per primary care. He does have diverticulosis coli, and I have advised a chronic high-fiber diet with fiber supplements as needed. No diagnosis found.

## 2012-07-16 NOTE — Patient Instructions (Addendum)
We have sent the following medications to your pharmacy for you to pick up at your convenience: Nexium.  CC: Lesle Chris, M. D.

## 2012-08-11 ENCOUNTER — Encounter: Payer: Self-pay | Admitting: Emergency Medicine

## 2012-09-09 ENCOUNTER — Other Ambulatory Visit: Payer: Self-pay | Admitting: Emergency Medicine

## 2012-10-14 ENCOUNTER — Ambulatory Visit (INDEPENDENT_AMBULATORY_CARE_PROVIDER_SITE_OTHER): Payer: Medicare Other | Admitting: Emergency Medicine

## 2012-10-14 VITALS — BP 152/76 | HR 65 | Temp 97.6°F | Resp 16 | Ht 66.0 in | Wt 144.0 lb

## 2012-10-14 DIAGNOSIS — Z23 Encounter for immunization: Secondary | ICD-10-CM

## 2012-10-14 DIAGNOSIS — R5383 Other fatigue: Secondary | ICD-10-CM

## 2012-10-14 DIAGNOSIS — R5381 Other malaise: Secondary | ICD-10-CM

## 2012-10-14 DIAGNOSIS — M109 Gout, unspecified: Secondary | ICD-10-CM

## 2012-10-14 DIAGNOSIS — E785 Hyperlipidemia, unspecified: Secondary | ICD-10-CM

## 2012-10-14 LAB — POCT CBC
HCT, POC: 47.9 % (ref 43.5–53.7)
Hemoglobin: 15 g/dL (ref 14.1–18.1)
Lymph, poc: 2.4 (ref 0.6–3.4)
MCH, POC: 30.5 pg (ref 27–31.2)
MCHC: 31.3 g/dL — AB (ref 31.8–35.4)
MCV: 97.3 fL — AB (ref 80–97)
MPV: 9.1 fL (ref 0–99.8)
RBC: 4.92 M/uL (ref 4.69–6.13)
WBC: 7.8 10*3/uL (ref 4.6–10.2)

## 2012-10-14 LAB — COMPREHENSIVE METABOLIC PANEL
ALT: 18 U/L (ref 0–53)
CO2: 30 mEq/L (ref 19–32)
Calcium: 10.1 mg/dL (ref 8.4–10.5)
Chloride: 100 mEq/L (ref 96–112)
Creat: 0.76 mg/dL (ref 0.50–1.35)
Glucose, Bld: 99 mg/dL (ref 70–99)
Total Bilirubin: 0.9 mg/dL (ref 0.3–1.2)

## 2012-10-14 LAB — URIC ACID: Uric Acid, Serum: 5 mg/dL (ref 4.0–7.8)

## 2012-10-14 LAB — LIPID PANEL
Cholesterol: 174 mg/dL (ref 0–200)
HDL: 48 mg/dL (ref >39)

## 2012-10-14 NOTE — Progress Notes (Signed)
  Subjective:    Patient ID: [name], male    DOB: 07-14-1935, 77 y.o.   MRN: [id number]  HPI presents today for follow up gout. Has not had any flare ups as long as he takes allopurinol. Over all feeling well. Does not sleep well at night. Wakes up every 1 to 2 hours. Would like a referral for sleep study. It says as long as he takes his allopurinol he does not have any gout flares.    Review of Systems     Objective:   Physical Exam HEENT exam is unremarkable. There is a blackish area beneath the skin adjacent to the left thigh which has been present for a number of years. His neck is supple chest clear cardiac is soft nontender abdomen soft nontender   Results for orders placed in visit on 10/14/12  POCT CBC      Component Value Range   WBC 7.8  4.6 - 10.2 K/uL   Lymph, poc 2.4  0.6 - 3.4   POC LYMPH PERCENT 30.9  10 - 50 %L   MID (cbc) 0.7  0 - 0.9   POC MID % 9.5  0 - 12 %M   POC Granulocyte 4.6  2 - 6.9   Granulocyte percent 59.6  37 - 80 %G   RBC 4.92  4.69 - 6.13 M/uL   Hemoglobin 15.0  14.1 - 18.1 g/dL   HCT, POC 13.0  86.5 - 53.7 %   MCV 97.3 (*) 80 - 97 fL   MCH, POC 30.5  27 - 31.2 pg   MCHC 31.3 (*) 31.8 - 35.4 g/dL   RDW, POC 78.4     Platelet Count, POC 322  142 - 424 K/uL   MPV 9.1  0 - 99.8 fL       Assessment & Plan:     We'll go ahead and check routine labs CBC Sinemet uric acid because he is on allopurinol with his history of gout. He also appears to have some type of sleep disorder. It does not sound exactly like sleep apnea but he may not reach 4 REM sleep. I have referred him to Dr. Vickey Huger  for her evaluation and she can decide what type of sleep study she feels it is indicated.

## 2012-10-26 ENCOUNTER — Other Ambulatory Visit: Payer: Self-pay | Admitting: Physician Assistant

## 2012-11-13 DIAGNOSIS — R351 Nocturia: Secondary | ICD-10-CM | POA: Diagnosis not present

## 2012-11-13 DIAGNOSIS — G4733 Obstructive sleep apnea (adult) (pediatric): Secondary | ICD-10-CM | POA: Diagnosis not present

## 2012-11-13 DIAGNOSIS — G4710 Hypersomnia, unspecified: Secondary | ICD-10-CM | POA: Diagnosis not present

## 2012-11-13 DIAGNOSIS — G4700 Insomnia, unspecified: Secondary | ICD-10-CM | POA: Diagnosis not present

## 2012-11-13 DIAGNOSIS — G459 Transient cerebral ischemic attack, unspecified: Secondary | ICD-10-CM | POA: Diagnosis not present

## 2012-11-19 ENCOUNTER — Telehealth: Payer: Self-pay | Admitting: Emergency Medicine

## 2012-11-19 NOTE — Telephone Encounter (Signed)
Spoke with patient and he has already had a follow up appt.

## 2012-11-19 NOTE — Telephone Encounter (Signed)
Please call patient and tell him to be sure he gets a followup appointment with Dr. Dyanne Iha to discuss results of his sleep study and to discuss treatment options

## 2012-11-23 DIAGNOSIS — G4733 Obstructive sleep apnea (adult) (pediatric): Secondary | ICD-10-CM | POA: Diagnosis not present

## 2012-11-25 DIAGNOSIS — C61 Malignant neoplasm of prostate: Secondary | ICD-10-CM | POA: Diagnosis not present

## 2012-12-02 DIAGNOSIS — N529 Male erectile dysfunction, unspecified: Secondary | ICD-10-CM | POA: Diagnosis not present

## 2012-12-02 DIAGNOSIS — C61 Malignant neoplasm of prostate: Secondary | ICD-10-CM | POA: Diagnosis not present

## 2012-12-02 DIAGNOSIS — N400 Enlarged prostate without lower urinary tract symptoms: Secondary | ICD-10-CM | POA: Diagnosis not present

## 2012-12-07 NOTE — Progress Notes (Signed)
I have the results of this test

## 2012-12-18 DIAGNOSIS — G4733 Obstructive sleep apnea (adult) (pediatric): Secondary | ICD-10-CM | POA: Diagnosis not present

## 2012-12-18 DIAGNOSIS — G4710 Hypersomnia, unspecified: Secondary | ICD-10-CM | POA: Diagnosis not present

## 2012-12-18 DIAGNOSIS — G459 Transient cerebral ischemic attack, unspecified: Secondary | ICD-10-CM | POA: Diagnosis not present

## 2012-12-18 DIAGNOSIS — R351 Nocturia: Secondary | ICD-10-CM | POA: Diagnosis not present

## 2012-12-22 DIAGNOSIS — H04129 Dry eye syndrome of unspecified lacrimal gland: Secondary | ICD-10-CM | POA: Diagnosis not present

## 2012-12-22 DIAGNOSIS — H02409 Unspecified ptosis of unspecified eyelid: Secondary | ICD-10-CM | POA: Diagnosis not present

## 2012-12-22 DIAGNOSIS — Z961 Presence of intraocular lens: Secondary | ICD-10-CM | POA: Diagnosis not present

## 2012-12-22 DIAGNOSIS — H40019 Open angle with borderline findings, low risk, unspecified eye: Secondary | ICD-10-CM | POA: Diagnosis not present

## 2012-12-30 DIAGNOSIS — L905 Scar conditions and fibrosis of skin: Secondary | ICD-10-CM | POA: Diagnosis not present

## 2013-01-20 ENCOUNTER — Encounter: Payer: Self-pay | Admitting: Emergency Medicine

## 2013-01-25 ENCOUNTER — Encounter: Payer: Self-pay | Admitting: Emergency Medicine

## 2013-02-02 ENCOUNTER — Ambulatory Visit (INDEPENDENT_AMBULATORY_CARE_PROVIDER_SITE_OTHER): Payer: Medicare Other | Admitting: Family Medicine

## 2013-02-02 VITALS — BP 116/62 | HR 85 | Temp 97.9°F | Resp 18 | Ht 66.0 in | Wt 147.0 lb

## 2013-02-02 DIAGNOSIS — IMO0002 Reserved for concepts with insufficient information to code with codable children: Secondary | ICD-10-CM

## 2013-02-02 DIAGNOSIS — L03011 Cellulitis of right finger: Secondary | ICD-10-CM

## 2013-02-02 MED ORDER — CEPHALEXIN 500 MG PO CAPS: 500.0000 mg | ORAL_CAPSULE | Freq: Three times a day (TID) | ORAL | Status: DC

## 2013-02-02 NOTE — Progress Notes (Signed)
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  Also has a blemish on right lower lip  Thinks he may have a splinter  Objective:  NAD Mild swelling right middle finger.  Area debrided with No. 15 bardparker, no splinter seen Blemish right lower lip  Assessment:  paroniychia and 1/2 cm cellulitic patch below right lower lip  Paronychia, right - Plan: cephALEXin (KEFLEX) 500 MG capsule

## 2013-02-02 NOTE — Patient Instructions (Addendum)
Paronychia  Paronychia is an inflammatory reaction involving the folds of the skin surrounding the fingernail. This is commonly caused by an infection in the skin around a nail. The most common cause of paronychia is frequent wetting of the hands (as seen with bartenders, food servers, nurses or others who wet their hands). This makes the skin around the fingernail susceptible to infection by bacteria (germs) or fungus. Other predisposing factors are:   Aggressive manicuring.   Nail biting.   Thumb sucking.  The most common cause is a staphylococcal (a type of germ) infection, or a fungal (Candida) infection. When caused by a germ, it usually comes on suddenly with redness, swelling, pus and is often painful. It may get under the nail and form an abscess (collection of pus), or form an abscess around the nail. If the nail itself is infected with a fungus, the treatment is usually prolonged and may require oral medicine for up to one year. Your caregiver will determine the length of time treatment is required. The paronychia caused by bacteria (germs) may largely be avoided by not pulling on hangnails or picking at cuticles. When the infection occurs at the tips of the finger it is called felon. When the cause of paronychia is from the herpes simplex virus (HSV) it is called herpetic whitlow.  TREATMENT   When an abscess is present treatment is often incision and drainage. This means that the abscess must be cut open so the pus can get out. When this is done, the following home care instructions should be followed.  HOME CARE INSTRUCTIONS    It is important to keep the affected fingers very dry. Rubber or plastic gloves over cotton gloves should be used whenever the hand must be placed in water.   Keep wound clean, dry and dressed as suggested by your caregiver between warm soaks or warm compresses.   Soak in warm water for fifteen to twenty minutes three to four times per day for bacterial infections. Fungal  infections are very difficult to treat, so often require treatment for long periods of time.   For bacterial (germ) infections take antibiotics (medicine which kill germs) as directed and finish the prescription, even if the problem appears to be solved before the medicine is gone.   Only take over-the-counter or prescription medicines for pain, discomfort, or fever as directed by your caregiver.  SEEK IMMEDIATE MEDICAL CARE IF:   You have redness, swelling, or increasing pain in the wound.   You notice pus coming from the wound.   You have a fever.   You notice a bad smell coming from the wound or dressing.  Document Released: 04/16/2001 Document Revised: 01/13/2012 Document Reviewed: 12/16/2008  [employer] Patient Information 2013 [employer].

## 2013-02-03 ENCOUNTER — Ambulatory Visit (INDEPENDENT_AMBULATORY_CARE_PROVIDER_SITE_OTHER): Payer: Medicare Other | Admitting: Family Medicine

## 2013-02-03 ENCOUNTER — Telehealth: Payer: Self-pay | Admitting: Radiology

## 2013-02-03 ENCOUNTER — Encounter: Payer: Self-pay | Admitting: Family Medicine

## 2013-02-03 VITALS — BP 152/80 | HR 74 | Temp 98.0°F | Resp 16 | Ht 65.5 in | Wt 146.6 lb

## 2013-02-03 DIAGNOSIS — M109 Gout, unspecified: Secondary | ICD-10-CM | POA: Diagnosis not present

## 2013-02-03 DIAGNOSIS — H538 Other visual disturbances: Secondary | ICD-10-CM | POA: Diagnosis not present

## 2013-02-03 DIAGNOSIS — R030 Elevated blood-pressure reading, without diagnosis of hypertension: Secondary | ICD-10-CM

## 2013-02-03 DIAGNOSIS — IMO0001 Reserved for inherently not codable concepts without codable children: Secondary | ICD-10-CM

## 2013-02-03 LAB — POCT SEDIMENTATION RATE: POCT SED RATE: 9 mm/hr (ref 0–22)

## 2013-02-03 MED ORDER — CIPROFLOXACIN HCL 500 MG PO TABS: 500.0000 mg | ORAL_TABLET | Freq: Two times a day (BID) | ORAL | Status: DC

## 2013-02-03 NOTE — Telephone Encounter (Signed)
Patient states he is having new onset of vision loss, thinks this is from the Keflex. I have advised him to come in to office now, not to drive, since he states he is "blind" he is advised if he can not find anyone to take him here he is to go to ER. To you FYI

## 2013-02-03 NOTE — Progress Notes (Signed)
[facility] [address], [place] [address] [phone]  Date:  02/03/2013   Name:  [name]   DOB:  [birth year]   MRN:  [id number]  PCP:  Lucilla Edin, MD    Chief Complaint: Blurred Vision   History of Present Illness:  [name] is a 78 y.o. very pleasant male patient who presents with the following: Blurred vision as of 12 noon today. Patient came in for evaluation and while waiting for evaluation blurry vision result. He says that the blurry vision is not either eye but when dealing with both eyes. He denies double vision however and has had no headache or eye pain. The only change in his medications of the last day or so involve Keflex which was prescribed for a paronychia and a blemish below his right lip.  He's had no weakness, change in speech, and no history of similar episodes.    Patient Active Problem List  Diagnosis  . HSV  . DRY EYE SYNDROME  . ESOPHAGEAL STRICTURE  . GERD  . Other symptoms involving digestive system  . Loss of weight  . Anal or rectal pain  . Personal history of colonic polyps    Past Medical History  Diagnosis Date  . Barrett's esophagus   . Herpes   . Prostate cancer   . Gout   . Prostatitis   . Cataract   . Stroke     tia in  2010  . Glaucoma   . Elevated PSA   . Impotence of organic origin   . Hypertrophy of prostate without urinary obstruction and other lower urinary tract symptoms (LUTS)     Past Surgical History  Procedure Laterality Date  . Refractive surgery    . Intracapsular cataract extraction      Lt eye  . Colonoscopy    . Upper gastrointestinal endoscopy      History  Substance Use Topics  . Smoking status: Never Smoker   . Smokeless tobacco: Never Used     Comment: Stopped in 1963  . Alcohol Use: 7.0 oz/week    3-4 Cans of beer, 14 Drinks containing 0.5 oz of alcohol per week     Comment: 2 drinks nightly    Family History  Problem Relation Age of Onset  .  Colon cancer Neg Hx   . Cirrhosis Mother     Liver disease  . Jaundice Mother   . Hyperlipidemia Father   . Heart disease Father     Allergies  Allergen Reactions  . Sulfamethoxazole-Tmp Ds     Medication list has been reviewed and updated.  Current Outpatient Prescriptions on File Prior to Visit  Medication Sig Dispense Refill  . allopurinol (ZYLOPRIM) 100 MG tablet Take 1 tablet (100 mg total) by mouth 2 (two) times daily.  60 tablet  3  . aspirin 81 MG tablet Take 81 mg by mouth daily.        . cephALEXin (KEFLEX) 500 MG capsule Take 1 capsule (500 mg total) by mouth 3 (three) times daily.  21 capsule  0  . cycloSPORINE (RESTASIS) 0.05 % ophthalmic emulsion 1 drop 2 (two) times daily. As needed.       Marland Kitchen esomeprazole (NEXIUM) 40 MG capsule Take 1 capsule (40 mg total) by mouth daily before breakfast.  30 capsule  11  . finasteride (PROSCAR) 5 MG tablet Take 5 mg by mouth daily.      Marland Kitchen  Multiple Vitamins-Minerals (MULTIVITAMIN WITH MINERALS) tablet Take 1 tablet by mouth daily.        . tadalafil (CIALIS) 20 MG tablet Take 20 mg by mouth daily as needed. As needed.       . valACYclovir (VALTREX) 1000 MG tablet Take 1,000 mg by mouth daily. 1/2 tab daily.       No current facility-administered medications on file prior to visit.    Review of Systems:    Physical Examination: Filed Vitals:   02/03/13 1524  BP: 152/80  Pulse: 74  Temp: 98 F (36.7 C)  Resp: 16   Filed Vitals:   02/03/13 1524  Height: 5' 5.5" (1.664 m)  Weight: 146 lb 9.6 oz (66.497 kg)   Body mass index is 24.02 kg/(m^2). Ideal Body Weight: Weight in (lb) to have BMI = 25: 152.2  Blood pressure recheck is 150/70. Pulse is regular. Examination of the fundi reveals sharp discs, no retinal hemorrhages or neovascularization, no increased cup to disc ratio, and no bayoneting Ears: Normal hearing, normal ear canals, normal TMs Oropharynx: Uvula rises midline, tongue is midline, there are no lesions.   neuro exam: Cranial nerves Massey through XII are intact, motor no asymmetry in strength. Gait: Normal, sensory: No asymmetry Chest: Clear Heart: Regular no murmur  Assessment and Plan: But that feeling is that the patient just wants to switch to Cipro, is favored antibiotic. He does not want to take the Keflex anymore even though I explained to him that blurry vision is not listed as any kind of side effect with Keflex., He is paronychia is improving in the blemish is about 30% better.  Plan: I agreed to switch from Keflex to Cipro as long as patient is washing his hands copiously with soap and water and will report any change. I will check his cemented  Signed Elvina Sidle, MD

## 2013-02-04 LAB — COMPREHENSIVE METABOLIC PANEL
ALT: 20 U/L (ref 0–53)
AST: 21 U/L (ref 0–37)
Albumin: 4.9 g/dL (ref 3.5–5.2)
Alkaline Phosphatase: 74 U/L (ref 39–117)
BUN: 17 mg/dL (ref 6–23)
CO2: 31 mEq/L (ref 19–32)
Calcium: 9.9 mg/dL (ref 8.4–10.5)
Chloride: 105 mEq/L (ref 96–112)
Creat: 0.79 mg/dL (ref 0.50–1.35)
Glucose, Bld: 95 mg/dL (ref 70–99)
Potassium: 4.5 mEq/L (ref 3.5–5.3)
Sodium: 142 mEq/L (ref 135–145)
Total Bilirubin: 0.5 mg/dL (ref 0.3–1.2)
Total Protein: 7.3 g/dL (ref 6.0–8.3)

## 2013-02-04 LAB — URIC ACID: Uric Acid, Serum: 4.2 mg/dL (ref 4.0–7.8)

## 2013-03-10 ENCOUNTER — Telehealth: Payer: Self-pay

## 2013-03-10 DIAGNOSIS — G4700 Insomnia, unspecified: Secondary | ICD-10-CM

## 2013-03-10 MED ORDER — TRAZODONE HCL 100 MG PO TABS: 100.0000 mg | ORAL_TABLET | Freq: Every day | ORAL | Status: DC

## 2013-03-10 NOTE — Telephone Encounter (Signed)
Message copied by Malachy Moan on Wed Mar 10, 2013 12:58 PM ------      Message from: [facility], CARMEN      Created: Wed Mar 10, 2013 12:55 PM      Regarding: FW: Requesting Trazodone                   ----- Message -----         From: [facility]         Sent: 03/10/2013  10:30 AM           To: Melvyn Novas, MD      Subject: Requesting Trazodone                                     This patient called and said that you previously prescribed Amitriptyline for him, but it caused excessive dizziness when he would awake.  Instead of the Amitriptyline, he would like to know if you could prescribe Trazodone.  He has heard that it is more effective and tolerable...kl ------

## 2013-03-10 NOTE — Telephone Encounter (Signed)
Patient requesting a Rx for Trazodone rather than Amitriptyline.  Last OV in [facility] on 12/18/2012 with Dr Vickey Huger says: Ambien - change to amitrypiline for the night. non habit forming.  Would you like to prescribe? Please advise.  Thank you.

## 2013-03-10 NOTE — Telephone Encounter (Signed)
Will order 100 mg tab, he should start at one half tab.

## 2013-03-11 ENCOUNTER — Other Ambulatory Visit: Payer: Self-pay | Admitting: Physician Assistant

## 2013-04-02 DIAGNOSIS — Z961 Presence of intraocular lens: Secondary | ICD-10-CM | POA: Diagnosis not present

## 2013-04-02 DIAGNOSIS — H02409 Unspecified ptosis of unspecified eyelid: Secondary | ICD-10-CM | POA: Diagnosis not present

## 2013-04-02 DIAGNOSIS — H40019 Open angle with borderline findings, low risk, unspecified eye: Secondary | ICD-10-CM | POA: Diagnosis not present

## 2013-04-02 DIAGNOSIS — H04129 Dry eye syndrome of unspecified lacrimal gland: Secondary | ICD-10-CM | POA: Diagnosis not present

## 2013-04-06 ENCOUNTER — Encounter: Payer: Self-pay | Admitting: Emergency Medicine

## 2013-04-06 ENCOUNTER — Ambulatory Visit (INDEPENDENT_AMBULATORY_CARE_PROVIDER_SITE_OTHER): Payer: Medicare Other | Admitting: Emergency Medicine

## 2013-04-06 VITALS — BP 151/85 | HR 73 | Temp 98.1°F | Resp 16 | Ht 65.5 in | Wt 146.0 lb

## 2013-04-06 DIAGNOSIS — E785 Hyperlipidemia, unspecified: Secondary | ICD-10-CM

## 2013-04-06 DIAGNOSIS — C61 Malignant neoplasm of prostate: Secondary | ICD-10-CM

## 2013-04-06 DIAGNOSIS — G459 Transient cerebral ischemic attack, unspecified: Secondary | ICD-10-CM

## 2013-04-06 DIAGNOSIS — M109 Gout, unspecified: Secondary | ICD-10-CM

## 2013-04-06 DIAGNOSIS — G4733 Obstructive sleep apnea (adult) (pediatric): Secondary | ICD-10-CM | POA: Insufficient documentation

## 2013-04-06 DIAGNOSIS — Z23 Encounter for immunization: Secondary | ICD-10-CM

## 2013-04-06 DIAGNOSIS — R9431 Abnormal electrocardiogram [ECG] [EKG]: Secondary | ICD-10-CM

## 2013-04-06 DIAGNOSIS — H04123 Dry eye syndrome of bilateral lacrimal glands: Secondary | ICD-10-CM

## 2013-04-06 DIAGNOSIS — B009 Herpesviral infection, unspecified: Secondary | ICD-10-CM

## 2013-04-06 DIAGNOSIS — R030 Elevated blood-pressure reading, without diagnosis of hypertension: Secondary | ICD-10-CM | POA: Diagnosis not present

## 2013-04-06 LAB — CBC WITH DIFFERENTIAL/PLATELET
Eosinophils Relative: 5 % (ref 0–5)
HCT: 43.3 % (ref 39.0–52.0)
Hemoglobin: 15.4 g/dL (ref 13.0–17.0)
Lymphocytes Relative: 29 % (ref 12–46)
Lymphs Abs: 2.1 10*3/uL (ref 0.7–4.0)
MCV: 88.4 fL (ref 78.0–100.0)
Monocytes Absolute: 1 10*3/uL (ref 0.1–1.0)
Monocytes Relative: 13 % — ABNORMAL HIGH (ref 3–12)
RBC: 4.9 MIL/uL (ref 4.22–5.81)
WBC: 7.2 10*3/uL (ref 4.0–10.5)

## 2013-04-06 LAB — POCT URINALYSIS DIPSTICK
Blood, UA: NEGATIVE
Nitrite, UA: NEGATIVE
Urobilinogen, UA: 0.2
pH, UA: 7

## 2013-04-06 MED ORDER — VALACYCLOVIR HCL 1 G PO TABS: ORAL_TABLET | ORAL | Status: DC

## 2013-04-06 MED ORDER — ALLOPURINOL 100 MG PO TABS: ORAL_TABLET | ORAL | Status: DC

## 2013-04-06 NOTE — Progress Notes (Signed)
  Subjective:    Patient ID: [name], male    DOB: 1935-08-27, 78 y.o.   MRN: [id number]  HPI patient is here for his yearly physical exam.    Review of Systems  HENT: Positive for hearing loss.   Eyes: Positive for redness.  Skin: Positive for pallor.   he is currently a patient of Dr. Lucious Groves but would like a second opinion from Dr. Hazle Quant regarding his symptoms. He has a history of TIA with the evaluation approximately 4 years ago but is interested in a reevaluation by the neurologist   he is followed by Dr. Retta Diones  for his prostate issues. He does need his Valtrex prescription. He does have a history of sleep apnea. He was unable to be accommodated with a CPAP machine. He is currently trying an oral appliance to help with his apnea problem. Objective:   Physical Exam patient has very fair complected male in no distress. Pupils are equal and reactive. TMs are clear. Throat is normal. Neck is supple. There are no carotid bruits. Chest is clear to auscultation and percussion. Heart regular rate no murmurs rubs or gallops. Abdomen is soft there are no tenderness.        Assessment & Plan:  Routine labs were done. Appointment will be made to Dr. Hazle Quant for second opinion regarding his problem. Appointment will be made with neurology regarding his history of TIA 4 years ago.

## 2013-04-07 LAB — LIPID PANEL
HDL: 56 mg/dL (ref >39)
LDL Cholesterol: 97 mg/dL (ref 0–99)
Triglycerides: 75 mg/dL (ref <150)
VLDL: 15 mg/dL (ref 0–40)

## 2013-04-07 LAB — COMPREHENSIVE METABOLIC PANEL
ALT: 22 U/L (ref 0–53)
Albumin: 4.3 g/dL (ref 3.5–5.2)
BUN: 14 mg/dL (ref 6–23)
CO2: 26 mEq/L (ref 19–32)
Chloride: 104 mEq/L (ref 96–112)
Creat: 0.67 mg/dL (ref 0.50–1.35)
Glucose, Bld: 82 mg/dL (ref 70–99)

## 2013-04-07 LAB — URIC ACID: Uric Acid, Serum: 4.6 mg/dL (ref 4.0–7.8)

## 2013-04-19 ENCOUNTER — Ambulatory Visit (INDEPENDENT_AMBULATORY_CARE_PROVIDER_SITE_OTHER): Payer: Medicare Other | Admitting: Neurology

## 2013-04-19 ENCOUNTER — Encounter: Payer: Self-pay | Admitting: Neurology

## 2013-04-19 VITALS — BP 161/89 | HR 70 | Ht 66.25 in | Wt 144.0 lb

## 2013-04-19 DIAGNOSIS — D518 Other vitamin B12 deficiency anemias: Secondary | ICD-10-CM | POA: Diagnosis not present

## 2013-04-19 DIAGNOSIS — R413 Other amnesia: Secondary | ICD-10-CM

## 2013-04-19 DIAGNOSIS — H5310 Unspecified subjective visual disturbances: Secondary | ICD-10-CM | POA: Diagnosis not present

## 2013-04-19 HISTORY — DX: Other amnesia: R41.3

## 2013-04-19 NOTE — Progress Notes (Signed)
Reason for visit: Vision change  [name] is a 78 y.o. male  History of present illness:  [name] is a 78 year old right-handed white male with a history of ocular migraine associated with fortification spectra in the right homonymous visual field, last seen through this office in 2010. The patient indicates that he is no longer having episodes of ocular migraine. The patient however, has noted some generalized visual blurring and dimming that has occurred within the last 2 months. The patient is having increasing problems with looking at the computer, but he indicates that reading glasses help. The patient has been through his ophthalmologist, who has not found any problem with his eyes. The patient denies any other symptoms of numbness, weakness, or balance issues. The patient indicates that he also has developed some problems with memory that has been coming on for several months. The patient has a sleep disorder, followed by Dr.  Vickey Huger., for sleep apnea. The patient returns for an evaluation.   Past Medical History  Diagnosis Date  . Barrett's esophagus   . Herpes   . Prostate cancer   . Gout   . Prostatitis   . Cataract   . Stroke     tia in  2010  . Glaucoma   . Elevated PSA   . Impotence of organic origin   . Hypertrophy of prostate without urinary obstruction and other lower urinary tract symptoms (LUTS)   . Vertigo   . Memory change 04/19/2013    Past Surgical History  Procedure Laterality Date  . Refractive surgery    . Intracapsular cataract extraction      Lt eye  . Colonoscopy    . Upper gastrointestinal endoscopy      Family History  Problem Relation Age of Onset  . Colon cancer Neg Hx   . Cirrhosis Mother     Liver disease  . Jaundice Mother   . Hyperlipidemia Father   . Heart disease Father     Social history:  reports that he has quit smoking. He has never used smokeless tobacco. He reports that he drinks about 7.0 ounces of alcohol  per week. He reports that he does not use illicit drugs.  Medications:  Current Outpatient Prescriptions on File Prior to Visit  Medication Sig Dispense Refill  . allopurinol (ZYLOPRIM) 100 MG tablet Take 2 tablets daily  180 tablet  3  . aspirin 81 MG tablet Take 81 mg by mouth daily.        . cycloSPORINE (RESTASIS) 0.05 % ophthalmic emulsion 1 drop 2 (two) times daily. As needed.       Marland Kitchen esomeprazole (NEXIUM) 40 MG capsule Take 1 capsule (40 mg total) by mouth daily before breakfast.  30 capsule  11  . finasteride (PROSCAR) 5 MG tablet Take 5 mg by mouth daily.      . Multiple Vitamins-Minerals (MULTIVITAMIN WITH MINERALS) tablet Take 1 tablet by mouth daily.        . tadalafil (CIALIS) 20 MG tablet Take 20 mg by mouth daily as needed. As needed.       . traZODone (DESYREL) 100 MG tablet Take 1 tablet (100 mg total) by mouth at bedtime.  30 tablet  1  . valACYclovir (VALTREX) 1000 MG tablet 1/2 tab daily.  45 tablet  3   No current facility-administered medications on file prior to visit.    Allergies:  Allergies  Allergen Reactions  . Sulfamethoxazole-Tmp Ds     ROS:  Out of a complete 14 system review of symptoms, the patient complains only of the following symptoms, and all other reviewed systems are negative.  Blurred vision  Snoring Insomnia  Blood pressure 161/89, pulse 70, height 5' 6.25" (1.683 m), weight 144 lb (65.318 kg).  Physical Exam  General: The patient is alert and cooperative at the time of the examination.  Head: Pupils are equal, round, and reactive to light. Discs are flat bilaterally.  Neck: The neck is supple, no carotid bruits are noted.  Respiratory: The respiratory examination is clear.  Cardiovascular: The cardiovascular examination reveals a regular rate and rhythm, no obvious murmurs or rubs are noted.  Skin: Extremities are without significant edema.  Neurologic Exam  Mental status: Mini-Mental status examination done today shows a  total score 25/30. The patient is able to name 12 animals in 60 seconds.   Cranial nerves: Facial symmetry is present. There is good sensation of the face to pinprick and soft touch bilaterally. The strength of the facial muscles and the muscles to head turning and shoulder shrug are normal bilaterally. Speech is well enunciated, no aphasia or dysarthria is noted. Extraocular movements are full. Visual fields are full.  Motor: The motor testing reveals 5 over 5 strength of all 4 extremities. Good symmetric motor tone is noted throughout.  Sensory: Sensory testing is intact to pinprick, soft touch, vibration sensation, and position sense on all 4 extremities. No evidence of extinction is noted.  Coordination: Cerebellar testing reveals good finger-nose-finger and heel-to-shin bilaterally.  Gait and station: Gait is normal. Tandem gait is normal. Romberg is negative. No drift is seen.  Reflexes: Deep tendon reflexes are symmetric and normal bilaterally, with the exception that the ankle jerk reflexes are depressed. . Toes are downgoing bilaterally.   Assessment/Plan:   One. Visual disturbance  2. Memory disturbance  3. Sleep apnea  The patient will be set up for MRI evaluation of the brain, and some blood work today. The patient will followup through this office in 6 months.. We may do further workup depending upon the results of the above. The patient also had a carotid Doppler study.   David Palau MD 04/19/2013 7:45 PM  [facility] [address] [place], [place] [address]  Phone [phone] Fax [phone]

## 2013-04-20 ENCOUNTER — Telehealth: Payer: Self-pay | Admitting: Neurology

## 2013-04-20 DIAGNOSIS — F40240 Claustrophobia: Secondary | ICD-10-CM

## 2013-04-20 DIAGNOSIS — G4733 Obstructive sleep apnea (adult) (pediatric): Secondary | ICD-10-CM

## 2013-04-20 LAB — ANGIOTENSIN CONVERTING ENZYME: Angio Convert Enzyme: 27 U/L (ref 14–82)

## 2013-04-20 LAB — SEDIMENTATION RATE: Sed Rate: 2 mm/hr (ref 0–30)

## 2013-04-20 LAB — RPR: RPR: NONREACTIVE

## 2013-04-20 NOTE — Progress Notes (Signed)
Quick Note:  Spoke with patient and relayed results of blood work. Patient understood and had no questions.  ______ 

## 2013-04-22 ENCOUNTER — Ambulatory Visit (INDEPENDENT_AMBULATORY_CARE_PROVIDER_SITE_OTHER): Payer: Medicare Other

## 2013-04-22 DIAGNOSIS — H5310 Unspecified subjective visual disturbances: Secondary | ICD-10-CM | POA: Diagnosis not present

## 2013-04-22 DIAGNOSIS — R413 Other amnesia: Secondary | ICD-10-CM

## 2013-04-26 ENCOUNTER — Ambulatory Visit
Admission: RE | Admit: 2013-04-26 | Discharge: 2013-04-26 | Disposition: A | Discharge status: Home / Self Care | Payer: Medicare Other | Source: Ambulatory Visit | Attending: Neurology | Admitting: Neurology

## 2013-04-26 DIAGNOSIS — R413 Other amnesia: Secondary | ICD-10-CM

## 2013-04-26 DIAGNOSIS — H5310 Unspecified subjective visual disturbances: Secondary | ICD-10-CM

## 2013-04-28 DIAGNOSIS — H35349 Macular cyst, hole, or pseudohole, unspecified eye: Secondary | ICD-10-CM | POA: Diagnosis not present

## 2013-04-28 DIAGNOSIS — H04129 Dry eye syndrome of unspecified lacrimal gland: Secondary | ICD-10-CM | POA: Diagnosis not present

## 2013-04-30 ENCOUNTER — Telehealth: Payer: Self-pay | Admitting: Neurology

## 2013-04-30 NOTE — Telephone Encounter (Signed)
I called patient. The MRI the brain shows mild to moderate small vessel disease. The patient is being evaluated for his sleep apnea, and he currently does not wish to go on medications for memory. The blood work was unremarkable.

## 2013-05-03 ENCOUNTER — Telehealth: Payer: Self-pay | Admitting: Neurology

## 2013-05-03 NOTE — Telephone Encounter (Signed)
Message copied by Stephanie Acre on Mon May 03, 2013  6:06 PM ------      Message from: Warren Lacy A      Created: Mon May 03, 2013  3:50 PM       Need to talk about preventative matters about stroke. Please call [phone] ------

## 2013-05-03 NOTE — Telephone Encounter (Signed)
I called the patient. On over the various risk factor modifications that may help reduce the risk for recurring stroke. I left a message. The patient will call me if he has further questions.

## 2013-05-04 ENCOUNTER — Telehealth: Payer: Self-pay | Admitting: Neurology

## 2013-05-04 NOTE — Telephone Encounter (Signed)
I called patient. The carotid Doppler study is unremarkable. 

## 2013-05-11 ENCOUNTER — Ambulatory Visit (INDEPENDENT_AMBULATORY_CARE_PROVIDER_SITE_OTHER): Payer: Medicare Other | Admitting: Neurology

## 2013-05-11 DIAGNOSIS — G4733 Obstructive sleep apnea (adult) (pediatric): Secondary | ICD-10-CM | POA: Diagnosis not present

## 2013-05-17 ENCOUNTER — Telehealth: Payer: Self-pay | Admitting: Neurology

## 2013-05-17 DIAGNOSIS — G4733 Obstructive sleep apnea (adult) (pediatric): Secondary | ICD-10-CM

## 2013-05-17 DIAGNOSIS — G459 Transient cerebral ischemic attack, unspecified: Secondary | ICD-10-CM

## 2013-05-17 NOTE — Telephone Encounter (Signed)
Patient with complex apnea and mild HST apnea ( AHI 1 was 9) , much worse in in lab study 28 AHI and than  Unable to tolerate the first CPAP attempt. Now sucessfully titrated to 16 cm water - FFM , meanwhlie patient had another TIA>

## 2013-05-18 ENCOUNTER — Encounter: Payer: Self-pay | Admitting: Neurology

## 2013-05-18 ENCOUNTER — Other Ambulatory Visit: Payer: Self-pay | Admitting: Neurology

## 2013-05-18 DIAGNOSIS — G4733 Obstructive sleep apnea (adult) (pediatric): Secondary | ICD-10-CM

## 2013-05-20 ENCOUNTER — Telehealth: Payer: Self-pay | Admitting: Neurology

## 2013-05-22 ENCOUNTER — Ambulatory Visit (INDEPENDENT_AMBULATORY_CARE_PROVIDER_SITE_OTHER): Payer: Medicare Other | Admitting: Emergency Medicine

## 2013-05-22 VITALS — BP 132/78 | HR 84 | Temp 97.8°F | Resp 16 | Ht 66.5 in | Wt 145.0 lb

## 2013-05-22 DIAGNOSIS — H53413 Scotoma involving central area, bilateral: Secondary | ICD-10-CM

## 2013-05-22 DIAGNOSIS — L910 Hypertrophic scar: Secondary | ICD-10-CM | POA: Diagnosis not present

## 2013-05-22 DIAGNOSIS — G4700 Insomnia, unspecified: Secondary | ICD-10-CM

## 2013-05-22 DIAGNOSIS — G5600 Carpal tunnel syndrome, unspecified upper limb: Secondary | ICD-10-CM

## 2013-05-22 DIAGNOSIS — G5601 Carpal tunnel syndrome, right upper limb: Secondary | ICD-10-CM

## 2013-05-22 DIAGNOSIS — H53459 Other localized visual field defect, unspecified eye: Secondary | ICD-10-CM

## 2013-05-22 MED ORDER — ZOLPIDEM TARTRATE 5 MG PO TABS: 5.0000 mg | ORAL_TABLET | Freq: Every evening | ORAL | Status: DC | PRN

## 2013-05-22 NOTE — Progress Notes (Signed)
  Subjective:    Patient ID: [name], male    DOB: 1934-12-01, 78 y.o.   MRN: [id number]  HPI Pt presents to clinic today with two issues. 1) Pt states he has noticed in the afternoons when it is bright and sunny outside he sees spots in Bil eyes. He reports no headaches or pain in the eyes. He takes a Baby Aspirin during the day when he sees the spots and this usually makes the spots resolve. Pt sees Dr. Hazle Quant for opthalmology. 2) Pt states he's having some carpal tunnel pains in his Rt wrist and would like to talk about getting a brace today. Problem #3 his insomnia and he is requesting a prescription for Ambien 5 to take for this. 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 This has been present since adolescence and he had it the area injected but it became infected and he would like a second opinion from a   Review of Systems     Objective:   Physical Exam patient is alert and cooperative in no distress. He has had bilateral cataract surgery. The discs are normal. The neck is supple. Carotids are 2+ there are no bruits heard. Deep tendon reflexes of the upper extremities and lower extremities are 1+. He has a questionable positive Tinel's, Phalen's of the right wrist.        Assessment & Plan:  He is going to take 2 baby aspirin a day. He is to contact Dr. Anne Hahn to see if he's a candidate for a different anticoagulation anti platelets drug. I did refill his Ambien 5 mg one at bedtime #30 refill x5. He was given a splint for the carpal tunnel syndrome of his right wrist .

## 2013-05-22 NOTE — Progress Notes (Signed)
  Subjective:    Patient ID: [name], male    DOB: 03-25-1935, 78 y.o.   MRN: [id number]  HPI    Review of Systems     Objective:   Physical Exam        Assessment & Plan:

## 2013-05-22 NOTE — Patient Instructions (Addendum)
Please contact Dr. Clarisa Kindred office for further advice. In the interim take 2 baby aspirin a day. We will contact you once we get an appointment for you to see the plastic surgeon. Wear the splint on your  right wrist at night.

## 2013-05-24 NOTE — Telephone Encounter (Signed)
Called patient. No answer. No message left.  

## 2013-05-26 NOTE — Telephone Encounter (Signed)
Called patient today to ensure he had heard results on his sleep study.  I was aware orders had been sent but needed to make sure he knew the results.  He states he was called and that he received his CPAP yesterday from [facility].  He is having a little trouble getting the mask to seal, he has contacted [facility] for some help with that.  I gave him a few pointers over the phone today and explained that if he still needed help, he was welcome to come in to the lab with his mask and I could help him here.

## 2013-06-01 ENCOUNTER — Ambulatory Visit (INDEPENDENT_AMBULATORY_CARE_PROVIDER_SITE_OTHER): Payer: Medicare Other | Admitting: Family Medicine

## 2013-06-01 ENCOUNTER — Telehealth: Payer: Self-pay | Admitting: *Deleted

## 2013-06-01 VITALS — BP 120/78 | HR 73 | Temp 97.9°F | Resp 17 | Ht 66.5 in | Wt 143.0 lb

## 2013-06-01 DIAGNOSIS — L0390 Cellulitis, unspecified: Secondary | ICD-10-CM | POA: Diagnosis not present

## 2013-06-01 DIAGNOSIS — R5381 Other malaise: Secondary | ICD-10-CM | POA: Diagnosis not present

## 2013-06-01 DIAGNOSIS — L0291 Cutaneous abscess, unspecified: Secondary | ICD-10-CM | POA: Diagnosis not present

## 2013-06-01 DIAGNOSIS — R5383 Other fatigue: Secondary | ICD-10-CM | POA: Diagnosis not present

## 2013-06-01 LAB — POCT CBC
Hemoglobin: 15.3 g/dL (ref 14.1–18.1)
Lymph, poc: 1.5 (ref 0.6–3.4)
MCHC: 32.8 g/dL (ref 31.8–35.4)
MID (cbc): 0.7 (ref 0–0.9)
MPV: 8.4 fL (ref 0–99.8)
POC Granulocyte: 7.4 — AB (ref 2–6.9)
POC MID %: 6.9 %M (ref 0–12)
Platelet Count, POC: 294 10*3/uL (ref 142–424)
RDW, POC: 13.6 %

## 2013-06-01 LAB — GLUCOSE, POCT (MANUAL RESULT ENTRY): POC Glucose: 91 mg/dl (ref 70–99)

## 2013-06-01 MED ORDER — DOXYCYCLINE HYCLATE 100 MG PO TABS: 100.0000 mg | ORAL_TABLET | Freq: Two times a day (BID) | ORAL | Status: DC

## 2013-06-01 NOTE — Telephone Encounter (Signed)
Message copied by Daryll Drown on Tue Jun 01, 2013  9:19 AM ------      Message from: [facility]      Created: Thu May 27, 2013  4:11 PM      Regarding: Return call       Pt called and left a message requesting a call back from you. [phone] ------

## 2013-06-01 NOTE — Patient Instructions (Addendum)
Use the antibiotic as directed for your skin infection.  If you are not getting better or if you are getting worse please let me know.

## 2013-06-01 NOTE — Telephone Encounter (Signed)
Called pt and LM.  Explained that last time we spoke he was having a few issues with his mask sealing and we had discussed some solutions.  Explained that if we need to meet to evaluate his mask and perhaps give him a different one, I would be happy to do that.  Asked him to give me a call and we can set something up.  Also stated that if he has any other question that I can help with, he can call back and we can discuss.

## 2013-06-01 NOTE — Progress Notes (Signed)
[facility] [address] [place] [address] [id number] [phone]  Date:  06/01/2013   Name:  [name]   DOB:  04-12-1935   MRN:  [id number]  PCP:  Lucilla Edin, MD    Chief Complaint: Dizziness and Keloid   History of Present Illness:  [name] is a 78 y.o. very pleasant male patient who presents with the following:  He is here today to follow- up a few issues.  He has a long history of a large keloid on his chest.  He was seen for this issue a couple of weeks ago and has been referred to plastic surgery.  He is still waiting on this appt.    He had this keloid injected with steroids about one year ago but it seemed to become infected.   He now notes that the keloid is more painful and red- like the last time it was infected.  He wonders if he may need abx again.  He is not aware of any injury to the area, and has not been "picking" at it.    He did have a TIA about 3 months ago.    He felt "red" last night, but did not check his temperature.  He is taking baby aspirin for TIA prevention He also notes he has felt dizzy since last night- he feels lighheaded.  He has not lost balance or fallen, no vertigo.   He is HOH, but no change or tinnitus.   He has noted some visual blurring that comes and goes since he had his TIA.  He has also noted that his rgiht great toe is numb for about one month,  He really only notices this at nigh when the sheets touch his toe.   Patient Active Problem List   Diagnosis Date Noted  . Memory change 04/19/2013  . Subjective vision disturbance 04/19/2013  . OSA (obstructive sleep apnea) 04/06/2013  . Other symptoms involving digestive system 11/13/2011  . Loss of weight 11/13/2011  . Anal or rectal pain 11/13/2011  . Personal history of colonic polyps 11/13/2011  . HSV 02/16/2010  . DRY EYE SYNDROME 02/16/2010  . ESOPHAGEAL STRICTURE 02/16/2010  . GERD 02/16/2010    Past Medical History  Diagnosis Date   . Barrett's esophagus   . Herpes   . Prostate cancer   . Gout   . Prostatitis   . Cataract   . Stroke     tia in  2010  . Glaucoma   . Elevated PSA   . Impotence of organic origin   . Hypertrophy of prostate without urinary obstruction and other lower urinary tract symptoms (LUTS)   . Vertigo   . Memory change 04/19/2013    Past Surgical History  Procedure Laterality Date  . Refractive surgery    . Intracapsular cataract extraction      Lt eye  . Colonoscopy    . Upper gastrointestinal endoscopy      History  Substance Use Topics  . Smoking status: Former Games developer  . Smokeless tobacco: Never Used     Comment: Stopped in 1963  . Alcohol Use: 7.0 oz/week    3-4 Cans of beer, 14 Drinks containing 0.5 oz of alcohol per week     Comment: 3 drinks nightly    Family History  Problem Relation Age of Onset  . Colon cancer Neg Hx   . Cirrhosis Mother     Liver disease  .  Jaundice Mother   . Hyperlipidemia Father   . Heart disease Father     Allergies  Allergen Reactions  . Sulfamethoxazole-Tmp Ds     Medication list has been reviewed and updated.  Current Outpatient Prescriptions on File Prior to Visit  Medication Sig Dispense Refill  . allopurinol (ZYLOPRIM) 100 MG tablet Take 2 tablets daily  180 tablet  3  . aspirin 81 MG tablet Take 81 mg by mouth daily.        . cycloSPORINE (RESTASIS) 0.05 % ophthalmic emulsion 1 drop 2 (two) times daily. As needed.       Marland Kitchen esomeprazole (NEXIUM) 40 MG capsule Take 1 capsule (40 mg total) by mouth daily before breakfast.  30 capsule  11  . finasteride (PROSCAR) 5 MG tablet Take 5 mg by mouth daily.      . Multiple Vitamins-Minerals (MULTIVITAMIN WITH MINERALS) tablet Take 1 tablet by mouth daily.        . tadalafil (CIALIS) 20 MG tablet Take 20 mg by mouth daily as needed. As needed.       . traZODone (DESYREL) 100 MG tablet Take 1 tablet (100 mg total) by mouth at bedtime.  30 tablet  1  . valACYclovir (VALTREX) 1000 MG tablet  1/2 tab daily.  45 tablet  3  . zolpidem (AMBIEN) 5 MG tablet Take 1 tablet (5 mg total) by mouth at bedtime as needed for sleep.  30 tablet  1   No current facility-administered medications on file prior to visit.    Review of Systems:  As per HPI- otherwise negative.   Physical Examination: Filed Vitals:   06/01/13 1014  BP: 120/78  Pulse: 73  Temp: 97.9 F (36.6 C)  Resp: 17   Filed Vitals:   06/01/13 1014  Height: 5' 6.5" (1.689 m)  Weight: 143 lb (64.864 kg)   Body mass index is 22.74 kg/(m^2). Ideal Body Weight: Weight in (lb) to have BMI = 25: 156.9  GEN: WDWN, NAD, Non-toxic, A & O x 3 HEENT: Atraumatic, Normocephalic. Neck supple. No masses, No LAD.  Bilateral TM wnl, oropharynx normal.  PEERL,EOMI.  Ears and Nose: No external deformity. CV: RRR, No M/G/R. No JVD. No thrill. No extra heart sounds. PULM: CTA B, no wheezes, crackles, rhonchi. No retractions. No resp. distress. No accessory muscle use. ABD: S, NT, ND, +BS. No rebound. No HSM. EXTR: No c/c/e. Feet have good circulation and are in good repair.  He notes subjective reduced sensation over the right great toe.   NEURO Normal gait. Normal strength, sensation, ROM and DTR al extremities.  Negative Romberg PSYCH: Normally interactive. Conversant. Not depressed or anxious appearing.  Calm demeanor.  He has a large, linear keloid lying horizontally over his sternum.  It is tender and red on the right side, but not fluctuant.  It appears to have a possible incorporated sebaceous cyst.    Results for orders placed in visit on 06/01/13  POCT CBC      Result Value Range   WBC 9.6  4.6 - 10.2 K/uL   Lymph, poc 1.5  0.6 - 3.4   POC LYMPH PERCENT 16.0  10 - 50 %L   MID (cbc) 0.7  0 - 0.9   POC MID % 6.9  0 - 12 %M   POC Granulocyte 7.4 (*) 2 - 6.9   Granulocyte percent 77.1  37 - 80 %G   RBC 4.83  4.69 - 6.13 M/uL   Hemoglobin 15.3  14.1 - 18.1 g/dL   HCT, POC 16.1  09.6 - 53.7 %   MCV 96.5  80 - 97 fL    MCH, POC 31.7 (*) 27 - 31.2 pg   MCHC 32.8  31.8 - 35.4 g/dL   RDW, POC 04.5     Platelet Count, POC 294  142 - 424 K/uL   MPV 8.4  0 - 99.8 fL  GLUCOSE, POCT (MANUAL RESULT ENTRY)      Result Value Range   POC Glucose 91  70 - 99 mg/dl    Assessment and Plan: Cellulitis - Plan: POCT CBC, doxycycline (VIBRA-TABS) 100 MG tablet  Other malaise and fatigue - Plan: POCT glucose (manual entry)  Probably cellulitis of keloid scar.  Treat with doxycycline- given 5 tablets to get him started because he cannot make it to the pharmacy right away  Take with food and water.  Clos follow- up if not better.   Fatigue and lightheadedness.  May be related to cellulitis.  Offered to perform further testing or imaging of his head, as he does have a history of TIA.  He prefers no further evaluation at this time, but will seek care if worse or not better Counseled him that the numbness in his toe is non- specific.  May be due to peripheral neuropathy vs changes from his TIA's. He iwll discuss with his neurologist the next time he is seen  See patient instructions for more details.     Signed Abbe Amsterdam, MD

## 2013-06-01 NOTE — Telephone Encounter (Signed)
Called back and the line was busy.Marland KitchenMarland Kitchen

## 2013-06-03 ENCOUNTER — Ambulatory Visit (INDEPENDENT_AMBULATORY_CARE_PROVIDER_SITE_OTHER): Payer: Medicare Other | Admitting: Emergency Medicine

## 2013-06-03 ENCOUNTER — Telehealth: Payer: Self-pay | Admitting: Neurology

## 2013-06-03 VITALS — BP 132/70 | HR 74 | Temp 98.1°F | Resp 18 | Ht 66.5 in | Wt 142.0 lb

## 2013-06-03 DIAGNOSIS — H538 Other visual disturbances: Secondary | ICD-10-CM

## 2013-06-03 DIAGNOSIS — L02219 Cutaneous abscess of trunk, unspecified: Secondary | ICD-10-CM | POA: Diagnosis not present

## 2013-06-03 DIAGNOSIS — L03319 Cellulitis of trunk, unspecified: Secondary | ICD-10-CM

## 2013-06-03 MED ORDER — CEPHALEXIN 500 MG PO CAPS: 500.0000 mg | ORAL_CAPSULE | Freq: Three times a day (TID) | ORAL | Status: DC

## 2013-06-03 NOTE — Progress Notes (Signed)
  Subjective:    Patient ID: [name], male    DOB: November 22, 1934, 78 y.o.   MRN: [id number]  HPI Had elevated blood pressure at home.  Blurred vision in both eyes. Patient states he has this on a daily basis. He developed an infection along the keloid right side of his chest and was started on doxycycline 48 hours ago. He states he has daily problems with his vision were become somewhat blurred. He does not have any other symptoms with this. He denies any weakness or numbness in his arms or legs. He is no problems with speech. He overall feels the same and these symptoms feel very similar to what he has had in the past   Review of Systems     Objective:   Physical Exam pupils are equal and reactive. Cranial nerves are intact. Neck is supple without carotid bruits. His heart was regular rate no murmurs. Abdomen soft nontender. Blood pressure was repeated and was 150/80. Examination of the chest reveals redness around the right side of his keloid with an area of redness about 3 x 3"        Assessment & Plan:  The patient was concerned that maybe some of these symptoms could be secondary to doxycycline so this was stopped and he was changed to cephalexin 500  three times a day. I did discuss this with Dr. Anne Hahn and Dr. Clarisa Kindred office will call and give him an appointment time. He will continue on 2 baby aspirin daily. Recheck in 48 hours. He is to monitor his blood pressure at home because his blood pressure was up today. He did stop drinking alcohol 2 nights ago when he started on doxycycline I told him he could have one drink a night while on the cephalexin

## 2013-06-03 NOTE — Telephone Encounter (Signed)
Dr. Cleta Alberts called today. The patient is still complaining of generalized visual blurring. This is similar to what he was seen for in the recent past. We will get another revisit for this patient. I question whether this is still some sort of migrainous events. We may initiate medical therapy for migraine. I will get a revisit.

## 2013-06-04 DIAGNOSIS — N400 Enlarged prostate without lower urinary tract symptoms: Secondary | ICD-10-CM | POA: Diagnosis not present

## 2013-06-05 ENCOUNTER — Telehealth: Payer: Self-pay

## 2013-06-05 ENCOUNTER — Ambulatory Visit (INDEPENDENT_AMBULATORY_CARE_PROVIDER_SITE_OTHER): Payer: Medicare Other | Admitting: Emergency Medicine

## 2013-06-05 VITALS — BP 132/70 | HR 86 | Temp 97.8°F | Resp 16 | Ht 65.0 in | Wt 142.2 lb

## 2013-06-05 DIAGNOSIS — L02219 Cutaneous abscess of trunk, unspecified: Secondary | ICD-10-CM | POA: Diagnosis not present

## 2013-06-05 DIAGNOSIS — L0390 Cellulitis, unspecified: Secondary | ICD-10-CM

## 2013-06-05 DIAGNOSIS — L0291 Cutaneous abscess, unspecified: Secondary | ICD-10-CM

## 2013-06-05 DIAGNOSIS — L03319 Cellulitis of trunk, unspecified: Secondary | ICD-10-CM | POA: Diagnosis not present

## 2013-06-05 DIAGNOSIS — R222 Localized swelling, mass and lump, trunk: Secondary | ICD-10-CM | POA: Diagnosis not present

## 2013-06-05 LAB — POCT CBC
Granulocyte percent: 75 %G (ref 37–80)
HCT, POC: 45 % (ref 43.5–53.7)
Hemoglobin: 14.5 g/dL (ref 14.1–18.1)
MCV: 97.6 fL — AB (ref 80–97)
RBC: 4.61 M/uL — AB (ref 4.69–6.13)

## 2013-06-05 NOTE — Telephone Encounter (Signed)
Patient needs hydrocodone sent to [facility] in [place] on [address].  [phone]

## 2013-06-05 NOTE — Progress Notes (Signed)
Verbal Consent Obtained. Local anesthesia with 2 cc of 2% lidocaine plain.  11 blade used to incise the fluctuant area of lesion centrally.  Purulence and a large amount of sebaceous material and cyst sac material expressed. Irrigated wound with 3 cc of 2% lidocaine and packed with 1/4 inch plain packing.  Cleansed and dressed.

## 2013-06-05 NOTE — Progress Notes (Signed)
  Subjective:    Patient ID: [name], male    DOB: [birth year], 78 y.o.   MRN: [id number]  HPI patient enters for recheck on abscess on the right side of his chest. This formed an area of keloid formation. He was having some difficulty with vision possibly secondary [place] but this turned out not to be the case. He is currently on Keflex as his antibiotic.    Review of Systems     Objective:   Physical Exam there is a large keloid across the center of the chest. On the right side is a 2 x 2 cm fluctuant area. This is consistent with abscess formation. There is redness extending around this area by about 3-1/2 inches        Assessment & Plan:  Patient here with an abscess on the right side of keloid. This area was opened and drained. He will continue Keflex until we get back his culture results. Recheck in 48 hours. Wound culture was sent.

## 2013-06-06 NOTE — Telephone Encounter (Signed)
Ok to call in Norco  325/5 every 4 hours  # 16

## 2013-06-07 ENCOUNTER — Ambulatory Visit (INDEPENDENT_AMBULATORY_CARE_PROVIDER_SITE_OTHER): Payer: Medicare Other | Admitting: Emergency Medicine

## 2013-06-07 VITALS — BP 130/68 | HR 77 | Temp 98.0°F | Resp 16 | Ht 66.0 in | Wt 142.0 lb

## 2013-06-07 DIAGNOSIS — L02219 Cutaneous abscess of trunk, unspecified: Secondary | ICD-10-CM

## 2013-06-07 DIAGNOSIS — L0390 Cellulitis, unspecified: Secondary | ICD-10-CM | POA: Diagnosis not present

## 2013-06-07 DIAGNOSIS — L02213 Cutaneous abscess of chest wall: Secondary | ICD-10-CM

## 2013-06-07 DIAGNOSIS — R222 Localized swelling, mass and lump, trunk: Secondary | ICD-10-CM

## 2013-06-07 DIAGNOSIS — L03319 Cellulitis of trunk, unspecified: Secondary | ICD-10-CM | POA: Diagnosis not present

## 2013-06-07 MED ORDER — HYDROCODONE-ACETAMINOPHEN 5-325 MG PO TABS: 1.0000 | ORAL_TABLET | Freq: Four times a day (QID) | ORAL | Status: DC | PRN

## 2013-06-07 NOTE — Telephone Encounter (Signed)
Called in for him. Called him to advise.  

## 2013-06-07 NOTE — Progress Notes (Signed)
  Subjective:    Patient ID: [name], male    DOB: [birth year], 78 y.o.   MRN: [id number]  HPI patient in for followup of an infected area in the sebaceous cyst which was beneath his keloid on the right side of his chest. When last seen here the cyst was removed. Culture had been done and is subsequently growing multiple organisms none predominating.    Review of Systems     Objective:   Physical Exam patient is alert and cooperative. The area of redness around the chest wall has decreased. The packing was removed and there appears to be a deep opening which was down to the fascia of the pectoralis muscle.. this area was irrigated and repacked.        Assessment & Plan:  We'll get a CT of the chest without contrast to evaluate the chest wall structures. Appointment to be made with Dr. Shella Spearing a plastic surgeon at [facility] who has an office [place]

## 2013-06-08 LAB — WOUND CULTURE
Gram Stain: NONE SEEN
Gram Stain: NONE SEEN

## 2013-06-09 DIAGNOSIS — N529 Male erectile dysfunction, unspecified: Secondary | ICD-10-CM | POA: Diagnosis not present

## 2013-06-10 ENCOUNTER — Ambulatory Visit (INDEPENDENT_AMBULATORY_CARE_PROVIDER_SITE_OTHER): Payer: Medicare Other | Admitting: Internal Medicine

## 2013-06-10 VITALS — BP 132/66 | HR 73 | Temp 97.8°F | Resp 16 | Ht 65.5 in | Wt 143.2 lb

## 2013-06-10 DIAGNOSIS — L03319 Cellulitis of trunk, unspecified: Secondary | ICD-10-CM

## 2013-06-10 DIAGNOSIS — R222 Localized swelling, mass and lump, trunk: Secondary | ICD-10-CM

## 2013-06-10 DIAGNOSIS — L0231 Cutaneous abscess of buttock: Secondary | ICD-10-CM

## 2013-06-10 NOTE — Patient Instructions (Signed)

## 2013-06-10 NOTE — Progress Notes (Signed)
Packing removed - no purulence expressed from wound - sebaceous material expressed from medial aspect of wound - wound tracks under the keloid.  Irrigation with 2% lido and then repacked into the medial track and then drsg placed.  Wound care d/w pt - change drsg daily and recheck on Monday.

## 2013-06-10 NOTE — Progress Notes (Signed)
  Subjective:    Patient ID: [name], male    DOB: 15-Oct-1935, 78 y.o.   MRN: [id number]  HPI Has healing abscess of keloid on chest, will remove 2nd packing. This is 2nd abscess of keloid in 2 yrs. Is scheduled to see plastic surgeon [facility].   Review of Systems     Objective:   Physical Exam Wound chest healing Remove wick/Dress wound        Assessment & Plan:  Wound care Painful keloid

## 2013-06-11 ENCOUNTER — Ambulatory Visit
Admission: RE | Admit: 2013-06-11 | Discharge: 2013-06-11 | Disposition: A | Discharge status: Home / Self Care | Payer: Medicare Other | Source: Ambulatory Visit | Attending: Emergency Medicine | Admitting: Emergency Medicine

## 2013-06-11 DIAGNOSIS — L02213 Cutaneous abscess of chest wall: Secondary | ICD-10-CM

## 2013-06-11 DIAGNOSIS — J984 Other disorders of lung: Secondary | ICD-10-CM | POA: Diagnosis not present

## 2013-06-11 DIAGNOSIS — R222 Localized swelling, mass and lump, trunk: Secondary | ICD-10-CM

## 2013-06-12 ENCOUNTER — Encounter (HOSPITAL_BASED_OUTPATIENT_CLINIC_OR_DEPARTMENT_OTHER): Payer: Self-pay | Admitting: Emergency Medicine

## 2013-06-12 ENCOUNTER — Emergency Department (HOSPITAL_BASED_OUTPATIENT_CLINIC_OR_DEPARTMENT_OTHER): Payer: Medicare Other

## 2013-06-12 ENCOUNTER — Telehealth: Payer: Self-pay

## 2013-06-12 ENCOUNTER — Emergency Department (HOSPITAL_BASED_OUTPATIENT_CLINIC_OR_DEPARTMENT_OTHER)
Admission: EM | Admit: 2013-06-12 | Discharge: 2013-06-12 | Disposition: A | Discharge status: Home / Self Care | Payer: Medicare Other | Attending: Emergency Medicine | Admitting: Emergency Medicine

## 2013-06-12 DIAGNOSIS — Z8619 Personal history of other infectious and parasitic diseases: Secondary | ICD-10-CM | POA: Insufficient documentation

## 2013-06-12 DIAGNOSIS — R259 Unspecified abnormal involuntary movements: Secondary | ICD-10-CM | POA: Diagnosis not present

## 2013-06-12 DIAGNOSIS — Z8673 Personal history of transient ischemic attack (TIA), and cerebral infarction without residual deficits: Secondary | ICD-10-CM | POA: Diagnosis not present

## 2013-06-12 DIAGNOSIS — R251 Tremor, unspecified: Secondary | ICD-10-CM

## 2013-06-12 DIAGNOSIS — Z8669 Personal history of other diseases of the nervous system and sense organs: Secondary | ICD-10-CM | POA: Insufficient documentation

## 2013-06-12 DIAGNOSIS — Z7982 Long term (current) use of aspirin: Secondary | ICD-10-CM | POA: Diagnosis not present

## 2013-06-12 DIAGNOSIS — Z8719 Personal history of other diseases of the digestive system: Secondary | ICD-10-CM | POA: Insufficient documentation

## 2013-06-12 DIAGNOSIS — Z79899 Other long term (current) drug therapy: Secondary | ICD-10-CM | POA: Insufficient documentation

## 2013-06-12 DIAGNOSIS — M109 Gout, unspecified: Secondary | ICD-10-CM | POA: Diagnosis not present

## 2013-06-12 DIAGNOSIS — Z8546 Personal history of malignant neoplasm of prostate: Secondary | ICD-10-CM | POA: Diagnosis not present

## 2013-06-12 DIAGNOSIS — Z87448 Personal history of other diseases of urinary system: Secondary | ICD-10-CM | POA: Diagnosis not present

## 2013-06-12 DIAGNOSIS — Z87891 Personal history of nicotine dependence: Secondary | ICD-10-CM | POA: Diagnosis not present

## 2013-06-12 LAB — BASIC METABOLIC PANEL
CO2: 26 mEq/L (ref 19–32)
Chloride: 106 mEq/L (ref 96–112)
Creatinine, Ser: 0.7 mg/dL (ref 0.50–1.35)
GFR calc Af Amer: >90 mL/min (ref >90)
Potassium: 4.2 mEq/L (ref 3.5–5.1)
Sodium: 142 mEq/L (ref 135–145)

## 2013-06-12 NOTE — ED Provider Notes (Signed)
CSN: [id number]     Arrival date & time 06/12/13  1900 History  This chart was scribed for Enid Skeens, MD by Ardelia Mems, ED Scribe. This patient was seen in room MH02/MH02 and the patient's care was started at 8:49 PM.   First MD Initiated Contact with Patient 06/12/13 2047     Chief Complaint  Patient presents with  . Tremors    The history is provided by the patient. No language interpreter was used.   HPI Comments: [name] is a 78 y.o. male  With a history of TIA who is on a CPAP machine who presents to the Emergency Department complaining of tremors that occurred last night. Pt states that last night, after deep inspiration using a new CPAP machine, he had 2 episodes of left-sided tremors. One of these episodes occurred during deep inspiration while pt was awake and the second occurred later in the night while he was sleeping, waking him. He states that he had a full mask on, which covered his mouth and nose, and he states that he was receiving more oxygen than he had in past use of CPAP machines. He denies associated symptoms or modifying factors. He states that he had no stroke deficits after his TIA. He denies vision or speech changes, chest pain, SOB, abdominal pain, vomiting, new numbness, neck stiffness, sore throat or any other symptoms. Pt is a former smoker and a regular alcohol drinker.  PCP- Dr. Lesle Chris   Past Medical History  Diagnosis Date  . Barrett's esophagus   . Herpes   . Prostate cancer   . Gout   . Prostatitis   . Cataract   . Stroke     tia in  2010  . Glaucoma   . Elevated PSA   . Impotence of organic origin   . Hypertrophy of prostate without urinary obstruction and other lower urinary tract symptoms (LUTS)   . Vertigo   . Memory change 04/19/2013   Past Surgical History  Procedure Laterality Date  . Refractive surgery    . Intracapsular cataract extraction      Lt eye  . Colonoscopy    . Upper gastrointestinal endoscopy      Family History  Problem Relation Age of Onset  . Colon cancer Neg Hx   . Cirrhosis Mother     Liver disease  . Jaundice Mother   . Hyperlipidemia Father   . Heart disease Father    History  Substance Use Topics  . Smoking status: Former Games developer  . Smokeless tobacco: Never Used     Comment: Stopped in 1963  . Alcohol Use: 7.0 oz/week    3-4 Cans of beer, 14 Drinks containing 0.5 oz of alcohol per week     Comment: 3 drinks nightly    Review of Systems  HENT: Negative for sore throat and neck stiffness.   Eyes: Negative for visual disturbance.  Respiratory: Negative for shortness of breath.   Cardiovascular: Negative for chest pain.  Gastrointestinal: Negative for vomiting and abdominal pain.  Neurological: Positive for tremors. Negative for speech difficulty and numbness (no new numbness).  All other systems reviewed and are negative.   Allergies  Sulfamethoxazole-tmp ds  Home Medications   Current Outpatient Rx  Name  Route  Sig  Dispense  Refill  . allopurinol (ZYLOPRIM) 100 MG tablet      Take 2 tablets daily   180 tablet   3   . aspirin 81 MG  tablet   Oral   Take 81 mg by mouth daily.           . cephALEXin (KEFLEX) 500 MG capsule   Oral   Take 1 capsule (500 mg total) by mouth 3 (three) times daily.   30 capsule   0   . cycloSPORINE (RESTASIS) 0.05 % ophthalmic emulsion      1 drop 2 (two) times daily. As needed.          . doxycycline (VIBRA-TABS) 100 MG tablet   Oral   Take 1 tablet (100 mg total) by mouth 2 (two) times daily.   18 tablet   0   . esomeprazole (NEXIUM) 40 MG capsule   Oral   Take 1 capsule (40 mg total) by mouth daily before breakfast.   30 capsule   11   . finasteride (PROSCAR) 5 MG tablet   Oral   Take 5 mg by mouth daily.         Marland Kitchen HYDROcodone-acetaminophen (NORCO/VICODIN) 5-325 MG per tablet   Oral   Take 1 tablet by mouth every 6 (six) hours as needed for pain.   16 tablet   0   . Multiple  Vitamins-Minerals (MULTIVITAMIN WITH MINERALS) tablet   Oral   Take 1 tablet by mouth daily.           . tadalafil (CIALIS) 20 MG tablet   Oral   Take 20 mg by mouth daily as needed. As needed.          . traZODone (DESYREL) 100 MG tablet   Oral   Take 1 tablet (100 mg total) by mouth at bedtime.   30 tablet   1     Start with 1/2 tab at night po , if tolerated,  In ...   . valACYclovir (VALTREX) 1000 MG tablet      1/2 tab daily.   45 tablet   3   . zolpidem (AMBIEN) 5 MG tablet   Oral   Take 1 tablet (5 mg total) by mouth at bedtime as needed for sleep.   30 tablet   1    Triage Vitals: BP 146/70  Pulse 77  Temp(Src) 98.7 F (37.1 C) (Oral)  Ht 5\' 6"  (1.676 m)  Wt 144 lb 3 oz (65.403 kg)  BMI 23.28 kg/m2  SpO2 100%  Physical Exam  Nursing note and vitals reviewed. Constitutional: He is oriented to person, place, and time. He appears well-developed and well-nourished.  HENT:  Head: Normocephalic and atraumatic.  Mouth/Throat: Oropharynx is clear and moist.  Eyes: EOM are normal. Pupils are equal, round, and reactive to light.  Neck: Normal range of motion. Neck supple. No tracheal deviation present.  Cardiovascular: Normal rate, regular rhythm and normal heart sounds.   No murmur heard. Pulmonary/Chest: Effort normal and breath sounds normal. No respiratory distress.  Abdominal: Soft. Bowel sounds are normal. There is no tenderness.  Musculoskeletal: Normal range of motion. He exhibits no tenderness.  Good gait.  Neurological: He is alert and oriented to person, place, and time. No cranial nerve deficit.  Cranial nerves 2-12 grossly intact. No drift observed. Sensation normal. Negative Romberg's sign.  Skin: Skin is warm and dry. No rash noted.  Psychiatric: He has a normal mood and affect.    ED Course   Procedures (including critical care time)  DIAGNOSTIC STUDIES: Oxygen Saturation is 100% on RA, normal by my interpretation.    COORDINATION OF  CARE: 8:58 PM- Pt advised  of plan for diagnostic radiology and lab work, as well as a follow-up with his PCP  and pt agrees.  Labs Reviewed  BASIC METABOLIC PANEL   Ct Chest Wo Contrast  06/11/2013   *RADIOLOGY REPORT*  Clinical Data: Resection of infected keloid.  Pain.  Chest wall abscess.  CT CHEST WITHOUT CONTRAST  Technique:  Multidetector CT imaging of the chest was performed following the standard protocol without IV contrast.  Comparison: Chest x-ray 07/17/2004  Findings: Lungs are clear.  No focal airspace opacities or suspicious nodules.  No effusions.  Heart is normal size.  Coronary artery calcifications in all three major coronary vessels.  Ectasia of the ascending thoracic aorta measuring 3.9 cm.  Calcifications in the aortic arch and descending thoracic aorta which are nonaneurysmal. No mediastinal, hilar, or axillary adenopathy.  Resection site is noted on the anterior chest wall.  There is mild stranding and locules of gas within the subcutaneous soft tissues. This area measures 1.6 x 1.3 cm and could reflect a small abscess. Chest wall soft tissues are otherwise unremarkable. Imaging into the upper abdomen shows no acute findings.  No acute bony abnormality.  IMPRESSION: Small soft tissue density with locules of gas in the anterior subcutaneous soft tissues which could represent a small abscess. This measures up to 1.6 cm.  Coronary artery disease.   Original Report Authenticated By: Charlett Nose, M.D.   No diagnosis found.  MDM  I personally performed the services described in this documentation, which was scribed in my presence. The recorded information has been reviewed and is accurate.  Atypical sxs, none in ED.  Two episodes of intermittent tremors, brief.  Screening head CT neg.  Normal neuro exam, very well appearing. Lytes normal.  Fup discussed.  Labs Reviewed  BASIC METABOLIC PANEL - Abnormal; Notable for the following:    GFR calc non Af Amer 89 (*)    All other  components within normal limits     Enid Skeens, MD 06/12/13 2249  Enid Skeens, MD 06/12/13 2251

## 2013-06-12 NOTE — ED Provider Notes (Deleted)
CSN: [id number]     Arrival date & time 06/12/13  1900 History     First MD Initiated Contact with Patient 06/12/13 2047     Chief Complaint  Patient presents with  . Tremors   (Consider location/radiation/quality/duration/timing/severity/associated sxs/prior Treatment) HPI  Past Medical History  Diagnosis Date  . Barrett's esophagus   . Herpes   . Prostate cancer   . Gout   . Prostatitis   . Cataract   . Stroke     tia in  2010  . Glaucoma   . Elevated PSA   . Impotence of organic origin   . Hypertrophy of prostate without urinary obstruction and other lower urinary tract symptoms (LUTS)   . Vertigo   . Memory change 04/19/2013   Past Surgical History  Procedure Laterality Date  . Refractive surgery    . Intracapsular cataract extraction      Lt eye  . Colonoscopy    . Upper gastrointestinal endoscopy     Family History  Problem Relation Age of Onset  . Colon cancer Neg Hx   . Cirrhosis Mother     Liver disease  . Jaundice Mother   . Hyperlipidemia Father   . Heart disease Father    History  Substance Use Topics  . Smoking status: Former Games developer  . Smokeless tobacco: Never Used     Comment: Stopped in 1963  . Alcohol Use: 7.0 oz/week    3-4 Cans of beer, 14 Drinks containing 0.5 oz of alcohol per week     Comment: 3 drinks nightly    Review of Systems  Allergies  Sulfamethoxazole-tmp ds  Home Medications   Current Outpatient Rx  Name  Route  Sig  Dispense  Refill  . allopurinol (ZYLOPRIM) 100 MG tablet      Take 2 tablets daily   180 tablet   3   . aspirin 81 MG tablet   Oral   Take 81 mg by mouth daily.           . cephALEXin (KEFLEX) 500 MG capsule   Oral   Take 1 capsule (500 mg total) by mouth 3 (three) times daily.   30 capsule   0   . cycloSPORINE (RESTASIS) 0.05 % ophthalmic emulsion      1 drop 2 (two) times daily. As needed.          . doxycycline (VIBRA-TABS) 100 MG tablet   Oral   Take 1 tablet (100 mg total) by  mouth 2 (two) times daily.   18 tablet   0   . esomeprazole (NEXIUM) 40 MG capsule   Oral   Take 1 capsule (40 mg total) by mouth daily before breakfast.   30 capsule   11   . finasteride (PROSCAR) 5 MG tablet   Oral   Take 5 mg by mouth daily.         Marland Kitchen HYDROcodone-acetaminophen (NORCO/VICODIN) 5-325 MG per tablet   Oral   Take 1 tablet by mouth every 6 (six) hours as needed for pain.   16 tablet   0   . Multiple Vitamins-Minerals (MULTIVITAMIN WITH MINERALS) tablet   Oral   Take 1 tablet by mouth daily.           . tadalafil (CIALIS) 20 MG tablet   Oral   Take 20 mg by mouth daily as needed. As needed.          . traZODone (DESYREL) 100 MG tablet  Oral   Take 1 tablet (100 mg total) by mouth at bedtime.   30 tablet   1     Start with 1/2 tab at night po , if tolerated,  In ...   . valACYclovir (VALTREX) 1000 MG tablet      1/2 tab daily.   45 tablet   3   . zolpidem (AMBIEN) 5 MG tablet   Oral   Take 1 tablet (5 mg total) by mouth at bedtime as needed for sleep.   30 tablet   1    BP 146/70  Pulse 77  Temp(Src) 98.7 F (37.1 C) (Oral)  Ht 5\' 6"  (1.676 m)  Wt 144 lb 3 oz (65.403 kg)  BMI 23.28 kg/m2  SpO2 100% Physical Exam  ED Course   Procedures (including critical care time)  Labs Reviewed  BASIC METABOLIC PANEL - Abnormal; Notable for the following:    GFR calc non Af Amer 89 (*)    All other components within normal limits   Ct Head Wo Contrast  06/12/2013   *RADIOLOGY REPORT*  Clinical Data: Tremors.  Tremors occurring with sleep.  CT HEAD WITHOUT CONTRAST  Technique:  Contiguous axial images were obtained from the base of the skull through the vertex without contrast.  Comparison: Brain MRI 04/26/2013.  Findings:  Calvarium:No acute osseous abnormality. No lytic or blastic lesion.  Orbits: Bilateral cataract resection.  Sinuses: Concha bullosa in the left middle turbinate.  Scattered inflammatory mucosal thickening in the ethmoid  sinuses, with minimal retained frothy secretions in the left frontal sinus.  The appearance is stable to improved from preceding brain MRI.  Brain: No evidence of acute abnormality, such as acute infarction, hemorrhage, hydrocephalus, or mass lesion/mass effect.Extensive intracranial calcified atherosclerosis. Scattered cerebral white matter low attenuation, usually related to chronic small vessel ischemia.  IMPRESSION: 1.  No evidence of acute intracranial disease. 2.  Senescent changes including chronic small vessel ischemia.   Original Report Authenticated By: Tiburcio Pea   Ct Chest Wo Contrast  06/11/2013   *RADIOLOGY REPORT*  Clinical Data: Resection of infected keloid.  Pain.  Chest wall abscess.  CT CHEST WITHOUT CONTRAST  Technique:  Multidetector CT imaging of the chest was performed following the standard protocol without IV contrast.  Comparison: Chest x-ray 07/17/2004  Findings: Lungs are clear.  No focal airspace opacities or suspicious nodules.  No effusions.  Heart is normal size.  Coronary artery calcifications in all three major coronary vessels.  Ectasia of the ascending thoracic aorta measuring 3.9 cm.  Calcifications in the aortic arch and descending thoracic aorta which are nonaneurysmal. No mediastinal, hilar, or axillary adenopathy.  Resection site is noted on the anterior chest wall.  There is mild stranding and locules of gas within the subcutaneous soft tissues. This area measures 1.6 x 1.3 cm and could reflect a small abscess. Chest wall soft tissues are otherwise unremarkable. Imaging into the upper abdomen shows no acute findings.  No acute bony abnormality.  IMPRESSION: Small soft tissue density with locules of gas in the anterior subcutaneous soft tissues which could represent a small abscess. This measures up to 1.6 cm.  Coronary artery disease.   Original Report Authenticated By: Charlett Nose, M.D.   1. Occasional tremors     MDM  Atypical sxs, none in ED.  Two episodes of  intermittent tremors, brief.  Screening head CT neg.  Normal neuro exam, very well appearing. Lytes normal.  Fup discussed.  Labs Reviewed  BASIC METABOLIC  PANEL - Abnormal; Notable for the following:    GFR calc non Af Amer 89 (*)    All other components within normal limits     Enid Skeens, MD 06/12/13 2249

## 2013-06-12 NOTE — Telephone Encounter (Signed)
I spoke with [name]. I advised him to go to the emergency room for check. He had 2 episodes of shaking of his left arm and left leg yesterday evening.

## 2013-06-12 NOTE — ED Notes (Signed)
Awaiting CT study, patient informed of delay. Spouse remains at bedside

## 2013-06-12 NOTE — Telephone Encounter (Signed)
PATIENT WOULD LIKE A CALL BACK FROM DR. DAUB ASAP.  SAYS HE IS HAVING PALPITATIONS ON HIS LEFT SIDE AND WANTS TO KNOW WHAT TO DO.  I TOLD HIM HE NEEDED TO GET TO THE HOSPITAL.  HE STILL WOULD LIKE A CALL FROM DR. DAUB. [phone]

## 2013-06-12 NOTE — ED Notes (Signed)
H/o TIA , last night had CPAP machine, woke up and left side was trembling, went back to sleep woke again with it, no problems all day. Called EDP and told to come to ED

## 2013-06-14 ENCOUNTER — Ambulatory Visit (INDEPENDENT_AMBULATORY_CARE_PROVIDER_SITE_OTHER): Payer: Medicare Other | Admitting: Emergency Medicine

## 2013-06-14 VITALS — BP 120/68 | HR 86 | Temp 98.0°F | Resp 16 | Ht 66.0 in | Wt 142.0 lb

## 2013-06-14 DIAGNOSIS — L02213 Cutaneous abscess of chest wall: Secondary | ICD-10-CM

## 2013-06-14 DIAGNOSIS — L02219 Cutaneous abscess of trunk, unspecified: Secondary | ICD-10-CM

## 2013-06-14 DIAGNOSIS — L03319 Cellulitis of trunk, unspecified: Secondary | ICD-10-CM

## 2013-06-14 NOTE — Patient Instructions (Signed)
Let Dr. Vickey Huger know about the episode with the shaking in your arm and leg. See Dr. Kelly Splinter as scheduled.

## 2013-06-14 NOTE — Progress Notes (Signed)
  Subjective:    Patient ID: [name], male    DOB: 02/21/1935, 78 y.o.   MRN: [id number]  HPI This 78 y.o. male presents for evaluation of a wound on the anterior chest wall, s/p I&D of a large cellulitis on 06/05/2013.  The lesion was an infected sebaceous cyst in one end of a very large keloid scar he's had since childhood.  Interim notes are reviewed, including a visit to the ED 2 days ago with abnormal sensation and movements in the limbs on the left side.  Evaluation was negative, and the symptoms have not recurred.  He is to see neurology (Dr. Vickey Huger) on 07/22/2013 and plastic surgery (Dr. Wayland Denis) on 07/23/2013.  No fever, chills.  No increasing pain (though it's still quite tender).  Tolerating the Keflex without difficulty.  Review of Systems As above.    Objective:   Physical Exam BP 120/68  Pulse 86  Temp(Src) 98 F (36.7 C) (Oral)  Resp 16  Ht 5\' 6"  (1.676 m)  Wt 142 lb (64.411 kg)  BMI 22.93 kg/m2  SpO2 96% WDWNWM, A&O x 3.  Dressing and packing removed.  Minimal purulence.  No additional purulence expressed.  No induration.  Wound bed appears healthy.  Irrigated with 3 cc 2% lidocaine plain.  Dressed.       Assessment & Plan:  Abscess of chest wall  Patient Instructions  Let Dr. Vickey Huger know about the episode with the shaking in your arm and leg. See Dr. Kelly Splinter as scheduled.   Fernande Bras, PA-C Physician Assistant-Certified [facility]

## 2013-06-16 ENCOUNTER — Telehealth: Payer: Self-pay | Admitting: Neurology

## 2013-06-16 NOTE — Telephone Encounter (Signed)
This patient called to let you know that  1. The Trazodone that you prescribed for him is only working for 2 - 3 hours before he wakes up.  He feels that this is not long enough, please advise.  2.  He was admitted to the hospital (see 06/12/2013 ED note) because he was using his CPAP machine and suddenly developed tremors.  He feels like it has something to do with the airflow from the CPAP.  He would like you to review the details of this ER visit and tell him your thoughts on what may have caused this reaction.

## 2013-06-17 ENCOUNTER — Ambulatory Visit (INDEPENDENT_AMBULATORY_CARE_PROVIDER_SITE_OTHER): Payer: Medicare Other | Admitting: Physician Assistant

## 2013-06-17 VITALS — BP 136/70 | HR 81 | Temp 98.0°F | Resp 16

## 2013-06-17 DIAGNOSIS — L02219 Cutaneous abscess of trunk, unspecified: Secondary | ICD-10-CM

## 2013-06-17 DIAGNOSIS — L02213 Cutaneous abscess of chest wall: Secondary | ICD-10-CM

## 2013-06-17 NOTE — Progress Notes (Signed)
Patient ID: [name] MRN: [id number], DOB: 1935/06/10 78 y.o. Date of Encounter: 06/17/2013, 4:03 PM  Primary Physician: Lucilla Edin, MD  Chief Complaint: Wound care   See previous note  HPI: 78 y.o. male presents for wound care s/p I&D on 06/05/13 Doing well No issues or complaints Afebrile/ no chills No nausea or vomiting Tolerating Keflex Still a little tender, but improving Daily dressing change Previous note reviewed  Past Medical History  Diagnosis Date  . Barrett's esophagus   . Herpes   . Prostate cancer   . Gout   . Prostatitis   . Cataract   . Stroke     tia in  2010  . Glaucoma   . Elevated PSA   . Impotence of organic origin   . Hypertrophy of prostate without urinary obstruction and other lower urinary tract symptoms (LUTS)   . Vertigo   . Memory change 04/19/2013     Home Meds: Prior to Admission medications   Medication Sig Start Date End Date Taking? Authorizing Provider  allopurinol (ZYLOPRIM) 100 MG tablet Take 2 tablets daily 04/06/13  Yes Collene Gobble, MD  aspirin 81 MG tablet Take 81 mg by mouth daily.     Yes Historical Provider, MD  cycloSPORINE (RESTASIS) 0.05 % ophthalmic emulsion 1 drop 2 (two) times daily. As needed.    Yes Historical Provider, MD  doxycycline (VIBRA-TABS) 100 MG tablet Take 1 tablet (100 mg total) by mouth 2 (two) times daily. 06/01/13  Yes Gwenlyn Found Copland, MD  esomeprazole (NEXIUM) 40 MG capsule Take 1 capsule (40 mg total) by mouth daily before breakfast. 07/16/12  Yes Mardella Layman, MD  finasteride (PROSCAR) 5 MG tablet Take 5 mg by mouth daily.   Yes Historical Provider, MD  Multiple Vitamins-Minerals (MULTIVITAMIN WITH MINERALS) tablet Take 1 tablet by mouth daily.     Yes Historical Provider, MD  tadalafil (CIALIS) 20 MG tablet Take 20 mg by mouth daily as needed. As needed.    Yes Historical Provider, MD  traZODone (DESYREL) 100 MG tablet Take 1 tablet (100 mg total) by mouth at bedtime. 03/10/13  Yes  Carmen Dohmeier, MD  valACYclovir (VALTREX) 1000 MG tablet 1/2 tab daily. 04/06/13  Yes Collene Gobble, MD  zolpidem (AMBIEN) 5 MG tablet Take 1 tablet (5 mg total) by mouth at bedtime as needed for sleep. 05/22/13  Yes Collene Gobble, MD  cephALEXin (KEFLEX) 500 MG capsule Take 1 capsule (500 mg total) by mouth 3 (three) times daily. 06/03/13   Collene Gobble, MD  HYDROcodone-acetaminophen (NORCO/VICODIN) 5-325 MG per tablet Take 1 tablet by mouth every 6 (six) hours as needed for pain. 06/07/13   Collene Gobble, MD    Allergies:  Allergies  Allergen Reactions  . Sulfamethoxazole-Tmp Ds     ROS: Constitutional: Afebrile, no chills Dermatological: Positive for wound.  GI: No nausea or vomiting   EXAM: Physical Exam: Blood pressure 136/70, pulse 81, temperature 98 F (36.7 C), temperature source Oral, resp. rate 16, SpO2 95.00%., There is no weight on file to calculate BMI. General: Well developed, well nourished, in no acute distress. Nontoxic appearing. Head: Normocephalic, atraumatic, sclera non-icteric.  Neck: Supple. Lungs: Breathing is unlabored. Heart: Normal rate. Skin:  Warm and moist. Dressing and packing in place. No induration or erythema. Mild tenderness to palpation at medial aspect of lesion.  Neuro: Alert and oriented X 3. Moves all extremities spontaneously. Normal gait.  Psych:  Responds to  questions appropriately with a normal affect.   PROCEDURE: Dressing and packing removed. No purulence expressed Wound bed healthy Irrigated with 1% plain lidocaine 5 cc. Repacked with small amount of 1/4 inch plain packing Dressing applied  LAB: Culture: Few GPC in pairs and chains   A/P: 78 y.o. male with cellulitis/abscess as above s/p I&D on 06/05/13 -Wound care per above -Continue Keflex -Pain well controlled -Daily dressing changes -Recheck 48 hours, possibly last visit  SignedEula Listen, PA-C 06/17/2013 4:03 PM

## 2013-06-19 ENCOUNTER — Ambulatory Visit (INDEPENDENT_AMBULATORY_CARE_PROVIDER_SITE_OTHER): Payer: Medicare Other | Admitting: Physician Assistant

## 2013-06-19 VITALS — BP 130/70 | HR 78 | Temp 98.1°F | Resp 16 | Ht 66.5 in | Wt 142.0 lb

## 2013-06-19 DIAGNOSIS — L02219 Cutaneous abscess of trunk, unspecified: Secondary | ICD-10-CM

## 2013-06-19 DIAGNOSIS — L02213 Cutaneous abscess of chest wall: Secondary | ICD-10-CM

## 2013-06-19 NOTE — Progress Notes (Signed)
  Subjective:    Patient ID: [name], male    DOB: May 23, 1935, 78 y.o.   MRN: [id number]  HPI   [name] is a very pleasant 78 yr old male here for wound care.  Previous notes reviewed.  Pt initially underwent I&D of an infected sebaceous cyst 06/05/13.  Pt reports that he is improving though the area is still fairly tender.  He is scheduled to see plastics in Sept for hopefully more definitive management of keloid/abscesses.     Review of Systems  Constitutional: Negative for fever and chills.  HENT: Negative.   Respiratory: Negative.   Cardiovascular: Negative.   Gastrointestinal: Negative.   Musculoskeletal: Negative.   Skin: Positive for wound.  Neurological: Negative.        Objective:   Physical Exam  Vitals reviewed. Constitutional: He is oriented to person, place, and time. He appears well-developed and well-nourished. No distress.  HENT:  Head: Normocephalic and atraumatic.  Eyes: Conjunctivae are normal. No scleral icterus.  Pulmonary/Chest: Effort normal.  Neurological: He is alert and oriented to person, place, and time.  Skin: Skin is warm and dry.     Large keloid across mid-chest; healing wound at right inferior portion of keloid; mild surrounding erythema but no tenderness or warmth; mildly TTP  Psychiatric: He has a normal mood and affect. His behavior is normal.     Wound Care: Dressing and packing removed.  No drainage on bandage.  Able to express only a tiny amount of serosanguinous fluid from the wound - no purulence or sebaceous material.  Irrigated with 5cc 2% plain lidocaine.  Repacked with approx 2cm of 1/4" plain packing.     Assessment & Plan:  Abscess of chest wall   [name] is a very pleasant 78 yr old male here for wound care following I&D 06/05/13.  Wound appears to be healing.  There is subjective improvement in tenderness.  Wound care as per above.  Will have pt f/u in 48 hours.  ?whether we could d/c packing at that time as the  wound is draining very little at this point.

## 2013-06-21 ENCOUNTER — Ambulatory Visit (INDEPENDENT_AMBULATORY_CARE_PROVIDER_SITE_OTHER): Payer: Medicare Other | Admitting: Physician Assistant

## 2013-06-21 VITALS — BP 110/64 | HR 75 | Temp 98.4°F | Resp 18 | Ht 66.5 in | Wt 146.0 lb

## 2013-06-21 DIAGNOSIS — L02619 Cutaneous abscess of unspecified foot: Secondary | ICD-10-CM

## 2013-06-21 DIAGNOSIS — L02213 Cutaneous abscess of chest wall: Secondary | ICD-10-CM

## 2013-06-21 NOTE — Telephone Encounter (Signed)
Patient is calling again because he did not receive a response regarding the medication or his visit to the ER.  Please advise.

## 2013-06-21 NOTE — Progress Notes (Signed)
Patient ID: [name] MRN: [id number], DOB: July 05, 1935 78 y.o. Date of Encounter: 06/21/2013, 3:49 PM  Chief Complaint: Wound care   See previous note  HPI: 78 y.o. y/o male presents for wound care s/p I&D on 06/05/13.  Doing well No issues or complaints Afebrile/ no chills No nausea or vomiting Plans to see plastic surgeon tomorrow for consult.  Pain improved.  Daily dressing change Previous note reviewed  Past Medical History  Diagnosis Date  . Barrett's esophagus   . Herpes   . Prostate cancer   . Gout   . Prostatitis   . Cataract   . Stroke     tia in  2010  . Glaucoma   . Elevated PSA   . Impotence of organic origin   . Hypertrophy of prostate without urinary obstruction and other lower urinary tract symptoms (LUTS)   . Vertigo   . Memory change 04/19/2013     Home Meds: Prior to Admission medications   Medication Sig Start Date End Date Taking? Authorizing Provider  allopurinol (ZYLOPRIM) 100 MG tablet Take 2 tablets daily 04/06/13  Yes Collene Gobble, MD  aspirin 81 MG tablet Take 81 mg by mouth daily.     Yes Historical Provider, MD  esomeprazole (NEXIUM) 40 MG capsule Take 1 capsule (40 mg total) by mouth daily before breakfast. 07/16/12  Yes Mardella Layman, MD  finasteride (PROSCAR) 5 MG tablet Take 5 mg by mouth daily.   Yes Historical Provider, MD  Multiple Vitamins-Minerals (MULTIVITAMIN WITH MINERALS) tablet Take 1 tablet by mouth daily.     Yes Historical Provider, MD  tadalafil (CIALIS) 20 MG tablet Take 20 mg by mouth daily as needed. As needed.    Yes Historical Provider, MD  traZODone (DESYREL) 100 MG tablet Take 1 tablet (100 mg total) by mouth at bedtime. 03/10/13  Yes Carmen Dohmeier, MD  valACYclovir (VALTREX) 1000 MG tablet 1/2 tab daily. 04/06/13  Yes Collene Gobble, MD  cephALEXin (KEFLEX) 500 MG capsule Take 1 capsule (500 mg total) by mouth 3 (three) times daily. 06/03/13   Collene Gobble, MD  cycloSPORINE (RESTASIS) 0.05 % ophthalmic emulsion  1 drop 2 (two) times daily. As needed.     Historical Provider, MD  doxycycline (VIBRA-TABS) 100 MG tablet Take 1 tablet (100 mg total) by mouth 2 (two) times daily. 06/01/13   Pearline Cables, MD  HYDROcodone-acetaminophen (NORCO/VICODIN) 5-325 MG per tablet Take 1 tablet by mouth every 6 (six) hours as needed for pain. 06/07/13   Collene Gobble, MD  zolpidem (AMBIEN) 5 MG tablet Take 1 tablet (5 mg total) by mouth at bedtime as needed for sleep. 05/22/13   Collene Gobble, MD    Allergies:  Allergies  Allergen Reactions  . Sulfamethoxazole-Tmp Ds     ROS: Constitutional: Afebrile, no chills Cardiovascular: negative for chest pain or palpitations Dermatological: Positive for wound and keloid scar. Negative for erythema, pain, or warmth.  GI: No nausea or vomiting   EXAM: Physical Exam: Blood pressure 110/64, pulse 75, temperature 98.4 F (36.9 C), temperature source Oral, resp. rate 18, height 5' 6.5" (1.689 m), weight 146 lb (66.225 kg), SpO2 97.00%., Body mass index is 23.21 kg/(m^2). General: Well developed, well nourished, in no acute distress. Nontoxic appearing. Head: Normocephalic, atraumatic, sclera non-icteric.  Neck: Supple. Lungs: Breathing is unlabored. Heart: Normal rate. Skin:  Warm and moist. Dressing and packing in place. No induration, erythema, or tenderness to palpation. Keloid scar present  Neuro: Alert and oriented X 3. Moves all extremities spontaneously. Normal gait.  Psych:  Responds to questions appropriately with a normal affect.       PROCEDURE: Dressing and packing removed. No purulence expressed Wound bed healthy Irrigated with 1% plain lidocaine 5 cc. Not repacked.  Dressing applied  LAB: Culture: multiple organisms present  A/P: 78 y.o. y/o male with chest wall cellulitis/abscess as above s/p I&D on 06/05/13 Wound care per above Pain well controlled Daily dressing changes Recheck as needed.  Plan to see plastic surgeon tomorrow    Signed, Rhoderick Moody, PA-C 06/21/2013 3:49 PM

## 2013-06-22 DIAGNOSIS — L910 Hypertrophic scar: Secondary | ICD-10-CM | POA: Diagnosis not present

## 2013-06-22 NOTE — Telephone Encounter (Signed)
I spoke to Dr. Frances Furbish in regards to patient's questions.  She said the patient should not be taking the Trazodone and Ambien, also if the patient is taking Trazodone 100mg  he can add 3-5mg  of Melatonin to that.  I spoke to patient and he is no long taking Ambien and taking the full dose of Trazodone, he will add the Melatonin to that and see how it works.  Also Dr. Frances Furbish and Dr. Vickey Huger said that the tremors would not be caused by the CPAP.   Dr. Frances Furbish felt it may be caused by chills from his wound, but not exactly sure.  Patient said he now has a good fit with his CPAP, with nose mask received from [facility].  Patient expressed understanding.

## 2013-07-07 ENCOUNTER — Ambulatory Visit (INDEPENDENT_AMBULATORY_CARE_PROVIDER_SITE_OTHER): Payer: Medicare Other | Admitting: Emergency Medicine

## 2013-07-07 ENCOUNTER — Encounter: Payer: Self-pay | Admitting: Neurology

## 2013-07-07 ENCOUNTER — Telehealth: Payer: Self-pay | Admitting: Neurology

## 2013-07-07 VITALS — BP 132/68 | HR 65 | Temp 98.4°F | Resp 20 | Ht 65.75 in | Wt 148.3 lb

## 2013-07-07 DIAGNOSIS — R209 Unspecified disturbances of skin sensation: Secondary | ICD-10-CM | POA: Diagnosis not present

## 2013-07-07 DIAGNOSIS — M79609 Pain in unspecified limb: Secondary | ICD-10-CM | POA: Diagnosis not present

## 2013-07-07 DIAGNOSIS — R200 Anesthesia of skin: Secondary | ICD-10-CM

## 2013-07-07 DIAGNOSIS — L910 Hypertrophic scar: Secondary | ICD-10-CM | POA: Diagnosis not present

## 2013-07-07 DIAGNOSIS — M79674 Pain in right toe(s): Secondary | ICD-10-CM

## 2013-07-07 NOTE — Progress Notes (Signed)
  Subjective:    Patient ID: [name], male    DOB: 09/19/1935, 78 y.o.   MRN: [id number]  HPI patient continues to have difficulty with numbness in his right great toe. He would like a referral for her specialist to evaluate this. He is trying to get approval for surgery of his keloid he has of his chest. He did develop an abscess in this area requiring packing and treatment for cellulitis.    Review of Systems     Objective:   Physical Exam the area of keloid formation in his anterior chest is improving. There is decreased redness no evidence of abscess at this time. Examination of the right great toe reveals decreased sensation of the toe otherwise exam is normal to        Assessment & Plan:  I did write a letter on his behalf to see if he get approval for Medicare for surgery of his keloid which had developed an abscess and cellulitis. Also made a referral to Dr. Charlsie Merles to evaluate the numbness in his great toe

## 2013-07-07 NOTE — Telephone Encounter (Signed)
Pt left a message requesting a call back regarding the trazodone.  He was advised to stop taking Trazodone and Ambien together and to start taking Trazodone and Melatonin.  He complains that the medications are lasting only 2 hours which is not long enough for him to get sleep.  He is requesting a call back as soon as possible. [phone]

## 2013-07-12 ENCOUNTER — Telehealth: Payer: Self-pay | Admitting: Neurology

## 2013-07-12 NOTE — Telephone Encounter (Signed)
I spoke with [name]. Who feels that his  CPAP nasal mask is working well for him.  He also reports that Ambien in conjunction with Trazodone  never helped him to sleep much bette, r than he does now , when he uses Trazodone  with  Melatonin -  I encouraged him to stay on the melatonin he may even increase his dose from 5 mg to 10 mg to be taken one hour before intended bed time. He understood the instructions and was happy to follow these advises.

## 2013-07-22 ENCOUNTER — Encounter: Payer: Self-pay | Admitting: Neurology

## 2013-07-22 ENCOUNTER — Ambulatory Visit (INDEPENDENT_AMBULATORY_CARE_PROVIDER_SITE_OTHER): Payer: Medicare Other | Admitting: Neurology

## 2013-07-22 VITALS — BP 146/82 | HR 70 | Ht 66.25 in | Wt 148.0 lb

## 2013-07-22 DIAGNOSIS — G4733 Obstructive sleep apnea (adult) (pediatric): Secondary | ICD-10-CM

## 2013-07-22 DIAGNOSIS — G4700 Insomnia, unspecified: Secondary | ICD-10-CM | POA: Diagnosis not present

## 2013-07-22 DIAGNOSIS — R0989 Other specified symptoms and signs involving the circulatory and respiratory systems: Secondary | ICD-10-CM

## 2013-07-22 DIAGNOSIS — G4730 Sleep apnea, unspecified: Secondary | ICD-10-CM | POA: Insufficient documentation

## 2013-07-22 NOTE — Progress Notes (Signed)
[facility]  Provider:  Melvyn Novas, M D  Referring Provider: Collene Gobble, MD Primary Care Physician:  Lucilla Edin, MD  Chief Complaint  Patient presents with  . Follow-up     sleep apnea , rm 10    HPI:  [name] is a 78 y.o. male  Is seen here as a referral/ revisit  from Dr. Cleta Alberts for CPAP compliance.   [name] underwent a home sleep test  On 11-13-12, which showed mild sleep apnea, when returning for a full night study titration in the standard setting , the AHI was much higher than initially thought.  His AHI was 45.4 supine AHI 18.5 the baseline study was performed on 11-23-2012 referring physician was Dr. Elgie Congo. Primary neurologist is Dr. Lesia Sago.  Patient returned for an adult CPAP titration on 7-8 2014. Please not that the patient had another TIA that made the titration more urgent, presuming that the CPAP reduces OSA as  risk factors for repeated strokes.  The patient endorsed  the Epworth sleepiness score at 10 points,  at a BMI of 25 , neck circumference of 13.75 inches.  AHI was now 13.7 , overall RDI 16.2,  supine AHI was 13.8 - the patient was titrated to 16 cm water, but had difficulties tolerating the face mask and a small size. An expression of  poor tolerance can be seen in increasing PLM s.  Today missed a that reports began that he has trouble initiating maintaining sleep even with trazodone and melatonin he may just get one and a half hours of sleep and once he wakes up it sometimes not able to get any for is asleep through the night. His fatigue severity scale is endorsed at 13.7 with Epworth at 4 points his lumbar using Ambien or NyQuil Vicodin.  The CPAP compliance was reviewed over various downloads and today's download in office.  CPAP was initiated on 05/24/2013 his first 30 day download short a 97% compliance, a residual AHI of 5.5 at a set pressure of 16 cm water he did have a lot of air leak problems during the  night and therefore his fullface mask was changed to a nasal mask model. It is TE time off therapy was 6 hours and 18 minutes dated 06-23-39. The patient is followed by advanced on care if his [employer].  In office download from 07/20/2013 shows an AHI of 1.9 much reduced in comparison to before again the pressure still at 16 cm water the patient uses his machine for 6 hours and 47 minutes on average he has some residual obstructive apneas but not central events.  Air leak has remained high. We have meeting today to see off he can be using a reduced  CPAP pressure,   and also to help him sustain sleep longer.  He has been watching TV when going to bed, and is living alone, retired . He  naps in daytime sometimes, 30-60 minutes. Caffeine intake  throughout the morning, alcohol in PM. He has a large keloid on his chaest wall,which was infected earlier last month and treated with ATB. This may have an effect on his breathing, too.     Review of Systems: Out of a complete 14 system review, the patient complains of only the following symptoms, and all other reviewed systems are negative.  epworth 10-13 , today 12.  MMSE 26-30   History   Social History  . Marital Status:  Single    Spouse Name: N/A    Number of Children: 2  . Years of Education: 14   Occupational History  . Retired    Social History Main Topics  . Smoking status: Former Games developer  . Smokeless tobacco: Never Used     Comment: Stopped in 1963  . Alcohol Use: 7.0 oz/week    3-4 Cans of beer, 14 Drinks containing 0.5 oz of alcohol per week     Comment: 3 drinks nightly  . Drug Use: No  . Sexual Activity: Yes     Comment: 5 partners   Other Topics Concern  . Not on file   Social History Narrative   Patient gets regular exercise.    Family History  Problem Relation Age of Onset  . Colon cancer Neg Hx   . Cirrhosis Mother     Liver disease  . Jaundice Mother   . Hyperlipidemia Father   . Heart disease Father      Past Medical History  Diagnosis Date  . Barrett's esophagus   . Herpes   . Prostate cancer   . Gout   . Prostatitis   . Cataract   . Stroke     tia in  2010  . Glaucoma   . Elevated PSA   . Impotence of organic origin   . Hypertrophy of prostate without urinary obstruction and other lower urinary tract symptoms (LUTS)   . Vertigo   . Memory change 04/19/2013    Past Surgical History  Procedure Laterality Date  . Refractive surgery    . Intracapsular cataract extraction      Lt eye  . Colonoscopy    . Upper gastrointestinal endoscopy      Current Outpatient Prescriptions  Medication Sig Dispense Refill  . allopurinol (ZYLOPRIM) 100 MG tablet Take 2 tablets daily  180 tablet  3  . aspirin 81 MG tablet Take 81 mg by mouth daily.        . cycloSPORINE (RESTASIS) 0.05 % ophthalmic emulsion 1 drop 2 (two) times daily. As needed.       Marland Kitchen esomeprazole (NEXIUM) 40 MG capsule Take 1 capsule (40 mg total) by mouth daily before breakfast.  30 capsule  11  . finasteride (PROSCAR) 5 MG tablet Take 5 mg by mouth daily.      . Multiple Vitamins-Minerals (MULTIVITAMIN WITH MINERALS) tablet Take 1 tablet by mouth daily.        . traZODone (DESYREL) 100 MG tablet Take 1 tablet (100 mg total) by mouth at bedtime.  30 tablet  1  . valACYclovir (VALTREX) 1000 MG tablet 1/2 tab daily.  45 tablet  3   No current facility-administered medications for this visit.    Allergies as of 07/22/2013 - Review Complete 07/22/2013  Allergen Reaction Noted  . Sulfamethoxazole-tmp ds  08/11/2012    Vitals: BP 146/82  Pulse 70  Ht 5' 6.25" (1.683 m)  Wt 148 lb (67.132 kg)  BMI 23.7 kg/m2 Last Weight:  Wt Readings from Last 1 Encounters:  07/22/13 148 lb (67.132 kg)   Last Height:   Ht Readings from Last 1 Encounters:  07/22/13 5' 6.25" (1.683 m)    Physical exam:  General: The patient is awake, alert and appears not in acute distress. The patient is well groomed. Head: Normocephalic,  atraumatic. Neck is supple.  Plan:  Treatment plan and additional workup : Blurred vision  Snoring  Insomnia  Blood pressure 161/89, pulse 70, height 5' 6.25" (1.683  m), weight 144 lb (65.318 kg).   Physical Exam  General: The patient is alert and cooperative at the time of the examination.  Head: Pupils are equal, round, and reactive to light. Discs are flat bilaterally.  Neck: The neck is supple, no carotid bruits are noted.  Respiratory: The respiratory examination is clear.  Cardiovascular: The cardiovascular examination reveals a regular rate and rhythm, no obvious murmurs or rubs are noted.  Skin: Extremities are without significant edema.  Neurologic Exam  Mental status: Mini-Mental status examination done today shows a total score 26/30. The patient is able to name 11 animals in 60 seconds.  Cranial nerves: Facial symmetry is present. There is good sensation of the face to pinprick and soft touch bilaterally. The strength of the facial muscles and the muscles to head turning and shoulder shrug are normal bilaterally. Speech is well enunciated, no aphasia or dysarthria is noted. Extraocular movements are full. Visual fields are full.  Motor: The motor testing reveals 5 over 5 strength of all 4 extremities. Good symmetric motor tone is noted throughout.  Sensory: Sensory testing is intact to pinprick, soft touch, vibration sensation, and position sense on all 4 extremities. No evidence of extinction is noted.  Coordination: Cerebellar testing reveals good finger-nose-finger and heel-to-shin bilaterally.  Gait and station: Gait is normal. Tandem gait is normal. Romberg is negative. No drift is seen.  Reflexes: Deep tendon reflexes are symmetric and normal bilaterally, with the exception that the ankle jerk reflexes are depressed. . Toes are downgoing bilaterally.   Assessment/Plan:  1)Reduction of pressure , based on auto titration- i would suggest 30 days on a 10 to 16 cm water  pressure.patient feels more refreshed , but craves more sleep.   Trazodone has not helped enough, and melatonin was not effective.   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 Trazodone has not helped enough, and melatonin was not effective.    3) he is not longer interested in dental device, which he failed in the past. CPAP to continue. Patient is compliant.

## 2013-07-22 NOTE — Patient Instructions (Signed)
Alcohol Problems °Most adults who drink alcohol drink in moderation (not a lot) are at low risk for developing problems related to their drinking. However, all drinkers, including low-risk drinkers, should know about the health risks connected with drinking alcohol. °RECOMMENDATIONS FOR LOW-RISK DRINKING  °Drink in moderation. Moderate drinking is defined as follows:  °· Men - no more than 2 drinks per day. °· Nonpregnant women - no more than 1 drink per day. °· Over age 65 - no more than 1 drink per day. °A standard drink is 12 grams of pure alcohol, which is equal to a 12 ounce bottle of beer or wine cooler, a 5 ounce glass of wine, or 1.5 ounces of distilled spirits (such as whiskey, brandy, vodka, or rum).  °ABSTAIN FROM (DO NOT DRINK) ALCOHOL: °· When pregnant or considering pregnancy. °· When taking a medication that interacts with alcohol. °· If you are alcohol dependent. °· A medical condition that prohibits drinking alcohol (such as ulcer, liver disease, or heart disease). °DISCUSS WITH YOUR CAREGIVER: °· If you are at risk for coronary heart disease, discuss the potential benefits and risks of alcohol use: Light to moderate drinking is associated with lower rates of coronary heart disease in certain populations (for example, men over age 45 and postmenopausal women). Infrequent or nondrinkers are advised not to begin light to moderate drinking to reduce the risk of coronary heart disease so as to avoid creating an alcohol-related problem. Similar protective effects can likely be gained through proper diet and exercise. °· Women and the elderly have smaller amounts of body water than men. As a result women and the elderly achieve a higher blood alcohol concentration after drinking the same amount of alcohol. °· Exposing a fetus to alcohol can cause a broad range of birth defects referred to as Fetal Alcohol Syndrome (FAS) or Alcohol-Related Birth Defects (ARBD). Although FAS/ARBD is connected with excessive  alcohol consumption during pregnancy, studies also have reported neurobehavioral problems in infants born to mothers reporting drinking an average of 1 drink per day during pregnancy. °· Heavier drinking (the consumption of more than 4 drinks per occasion by men and more than 3 drinks per occasion by women) impairs learning (cognitive) and psychomotor functions and increases the risk of alcohol-related problems, including accidents and injuries. °CAGE QUESTIONS:  °· Have you ever felt that you should Cut down on your drinking? °· Have people Annoyed you by criticizing your drinking? °· Have you ever felt bad or Guilty about your drinking? °· Have you ever had a drink first thing in the morning to steady your nerves or get rid of a hangover (Eye opener)? °If you answered positively to any of these questions: You may be at risk for alcohol-related problems if alcohol consumption is:  °· Men: Greater than 14 drinks per week or more than 4 drinks per occasion. °· Women: Greater than 7 drinks per week or more than 3 drinks per occasion. °Do you or your family have a medical history of alcohol-related problems, such as: °· Blackouts. °· Sexual dysfunction. °· Depression. °· Trauma. °· Liver dysfunction. °· Sleep disorders. °· Hypertension. °· Chronic abdominal pain. °· Has your drinking ever caused you problems, such as problems with your family, problems with your work (or school) performance, or accidents/injuries? °· Do you have a compulsion to drink or a preoccupation with drinking? °· Do you have poor control or are you unable to stop drinking once you have started? °· Do you have to drink to   avoid withdrawal symptoms? °· Do you have problems with withdrawal such as tremors, nausea, sweats, or mood disturbances? °· Does it take more alcohol than in the past to get you high? °· Do you feel a strong urge to drink? °· Do you change your plans so that you can have a drink? °· Do you ever drink in the morning to relieve  the shakes or a hangover? °If you have answered a number of the previous questions positively, it may be time for you to talk to your caregivers, family, and friends and see if they think you have a problem. Alcoholism is a chemical dependency that keeps getting worse and will eventually destroy your health and relationships. Many alcoholics end up dead, impoverished, or in prison. This is often the end result of all chemical dependency. °· Do not be discouraged if you are not ready to take action immediately. °· Decisions to change behavior often involve up and down desires to change and feeling like you cannot decide. °· Try to think more seriously about your drinking behavior. °· Think of the reasons to quit. °WHERE TO GO FOR ADDITIONAL INFORMATION  °· The National Institute on Alcohol Abuse and Alcoholism (NIAAA) °www.niaaa.nih.gov °· National Council on Alcoholism and Drug Dependence (NCADD) °www.ncadd.org °· American Society of Addiction Medicine (ASAM) °www.asam.org  °Document Released: 10/21/2005 Document Revised: 01/13/2012 Document Reviewed: 06/08/2008 °ExitCare® Patient Information ©2014 [employer]. ° °

## 2013-07-23 ENCOUNTER — Encounter (HOSPITAL_BASED_OUTPATIENT_CLINIC_OR_DEPARTMENT_OTHER): Payer: Self-pay | Admitting: *Deleted

## 2013-07-23 NOTE — Progress Notes (Signed)
To to come in for bmet- Asked him to limit beer to no more than 2-3 a night and to brink water-too much beer can dehydrate him. Does use a cpap-even though he is small-will bring it [employer]

## 2013-07-23 NOTE — Addendum Note (Signed)
Addended by: Melvyn Novas on: 07/23/2013 04:35 PM   Modules accepted: Orders

## 2013-07-26 ENCOUNTER — Encounter (HOSPITAL_BASED_OUTPATIENT_CLINIC_OR_DEPARTMENT_OTHER)
Admission: RE | Admit: 2013-07-26 | Discharge: 2013-07-26 | Disposition: A | Discharge status: Home / Self Care | Payer: Medicare Other | Source: Ambulatory Visit | Attending: Plastic Surgery | Admitting: Plastic Surgery

## 2013-07-26 DIAGNOSIS — L910 Hypertrophic scar: Secondary | ICD-10-CM | POA: Diagnosis not present

## 2013-07-26 DIAGNOSIS — M2020 Hallux rigidus, unspecified foot: Secondary | ICD-10-CM | POA: Diagnosis not present

## 2013-07-26 DIAGNOSIS — L723 Sebaceous cyst: Secondary | ICD-10-CM | POA: Diagnosis not present

## 2013-07-26 DIAGNOSIS — G589 Mononeuropathy, unspecified: Secondary | ICD-10-CM | POA: Diagnosis not present

## 2013-07-26 LAB — BASIC METABOLIC PANEL
CO2: 30 mEq/L (ref 19–32)
Calcium: 10.3 mg/dL (ref 8.4–10.5)
Creatinine, Ser: 0.75 mg/dL (ref 0.50–1.35)
Glucose, Bld: 76 mg/dL (ref 70–99)

## 2013-07-27 ENCOUNTER — Other Ambulatory Visit: Payer: Self-pay | Admitting: Gastroenterology

## 2013-07-27 NOTE — Telephone Encounter (Signed)
PATIENT WILL NEED TO PLEASE MAKE AN OFFICE VISIT FOR FURTHER REFILLS

## 2013-07-29 ENCOUNTER — Encounter (HOSPITAL_BASED_OUTPATIENT_CLINIC_OR_DEPARTMENT_OTHER): Payer: Self-pay | Admitting: Anesthesiology

## 2013-07-29 ENCOUNTER — Encounter (HOSPITAL_BASED_OUTPATIENT_CLINIC_OR_DEPARTMENT_OTHER)
Admission: RE | Disposition: A | Discharge status: Home / Self Care | Payer: Self-pay | Source: Ambulatory Visit | Attending: Plastic Surgery

## 2013-07-29 ENCOUNTER — Encounter (HOSPITAL_BASED_OUTPATIENT_CLINIC_OR_DEPARTMENT_OTHER): Payer: Self-pay | Admitting: Plastic Surgery

## 2013-07-29 ENCOUNTER — Ambulatory Visit (HOSPITAL_BASED_OUTPATIENT_CLINIC_OR_DEPARTMENT_OTHER)
Admission: RE | Admit: 2013-07-29 | Discharge: 2013-07-29 | Disposition: A | Discharge status: Home / Self Care | Payer: Medicare Other | Source: Ambulatory Visit | Attending: Plastic Surgery | Admitting: Plastic Surgery

## 2013-07-29 DIAGNOSIS — L910 Hypertrophic scar: Secondary | ICD-10-CM | POA: Diagnosis not present

## 2013-07-29 DIAGNOSIS — L723 Sebaceous cyst: Secondary | ICD-10-CM | POA: Insufficient documentation

## 2013-07-29 HISTORY — PX: MASS EXCISION: SHX2000

## 2013-07-29 HISTORY — DX: Unspecified hearing loss, unspecified ear: H91.90

## 2013-07-29 HISTORY — PX: MINOR GRAFT APPLICATION: SHX5978

## 2013-07-29 HISTORY — DX: Gastro-esophageal reflux disease without esophagitis: K21.9

## 2013-07-29 HISTORY — DX: Insomnia, unspecified: G47.00

## 2013-07-29 SURGERY — MINOR EXCISION OF MASS
Anesthesia: LOCAL | Site: Chest | Wound class: Clean

## 2013-07-29 MED ORDER — FENTANYL CITRATE 0.05 MG/ML IJ SOLN
50.0000 ug | INTRAMUSCULAR | Status: DC | PRN
Start: 2013-07-29 — End: 2013-07-29

## 2013-07-29 MED ORDER — BUPIVACAINE-EPINEPHRINE 0.25% -1:200000 IJ SOLN
INTRAMUSCULAR | Status: DC | PRN
  Administered 2013-07-29: 20 mL

## 2013-07-29 MED ORDER — LACTATED RINGERS IV SOLN: INTRAVENOUS | Status: DC

## 2013-07-29 MED ORDER — MIDAZOLAM HCL 2 MG/2ML IJ SOLN: 1.0000 mg | INTRAMUSCULAR | Status: DC | PRN

## 2013-07-29 MED ORDER — LIDOCAINE-EPINEPHRINE 1 %-1:100000 IJ SOLN
INTRAMUSCULAR | Status: DC | PRN
  Administered 2013-07-29: 20 mL

## 2013-07-29 SURGICAL SUPPLY — 62 items
ADH SKN CLS APL DERMABOND .7 (GAUZE/BANDAGES/DRESSINGS) ×2
APL SKNCLS STERI-STRIP NONHPOA (GAUZE/BANDAGES/DRESSINGS)
BANDAGE CONFORM 2  STR LF (GAUZE/BANDAGES/DRESSINGS) IMPLANT
BENZOIN TINCTURE PRP APPL 2/3 (GAUZE/BANDAGES/DRESSINGS) IMPLANT
BLADE SURG 15 STRL LF DISP TIS (BLADE) ×2 IMPLANT
BLADE SURG 15 STRL SS (BLADE) ×3
BLADE SURG ROTATE 9660 (MISCELLANEOUS) IMPLANT
BNDG CMPR MD 5X2 ELC HKLP STRL (GAUZE/BANDAGES/DRESSINGS)
BNDG ELASTIC 2 VLCR STRL LF (GAUZE/BANDAGES/DRESSINGS) IMPLANT
CANISTER SUCTION 1200CC (MISCELLANEOUS) IMPLANT
CLEANER CAUTERY TIP 5X5 PAD (MISCELLANEOUS) IMPLANT
CLOTH BEACON ORANGE TIMEOUT ST (SAFETY) ×3 IMPLANT
CORDS BIPOLAR (ELECTRODE) IMPLANT
COVER MAYO STAND STRL (DRAPES) ×3 IMPLANT
COVER TABLE BACK 60X90 (DRAPES) ×3 IMPLANT
DERMABOND ADVANCED (GAUZE/BANDAGES/DRESSINGS) ×1
DERMABOND ADVANCED .7 DNX12 (GAUZE/BANDAGES/DRESSINGS) ×2 IMPLANT
DRAPE U-SHAPE 76X120 STRL (DRAPES) ×3 IMPLANT
DRSG TEGADERM 4X10 (GAUZE/BANDAGES/DRESSINGS) ×3 IMPLANT
ELECT COATED BLADE 2.86 ST (ELECTRODE) IMPLANT
ELECT NEEDLE BLADE 2-5/6 (NEEDLE) ×3 IMPLANT
ELECT REM PT RETURN 9FT ADLT (ELECTROSURGICAL) ×3
ELECT REM PT RETURN 9FT PED (ELECTROSURGICAL)
ELECTRODE REM PT RETRN 9FT PED (ELECTROSURGICAL) IMPLANT
ELECTRODE REM PT RTRN 9FT ADLT (ELECTROSURGICAL) ×2 IMPLANT
GAUZE SPONGE 4X4 12PLY STRL LF (GAUZE/BANDAGES/DRESSINGS) IMPLANT
GAUZE XEROFORM 1X8 LF (GAUZE/BANDAGES/DRESSINGS) IMPLANT
GLOVE BIO SURGEON STRL SZ 6.5 (GLOVE) ×9 IMPLANT
GLOVE SURG SS PI 7.0 STRL IVOR (GLOVE) ×3 IMPLANT
GOWN PREVENTION PLUS XLARGE (GOWN DISPOSABLE) ×9 IMPLANT
MICROMATRIX 1000MG (Tissue) ×3 IMPLANT
NEEDLE 27GAX1X1/2 (NEEDLE) IMPLANT
NEEDLE HYPO 30GX1 BEV (NEEDLE) ×3 IMPLANT
NS IRRIG 1000ML POUR BTL (IV SOLUTION) ×3 IMPLANT
PACK BASIN DAY SURGERY FS (CUSTOM PROCEDURE TRAY) ×3 IMPLANT
PAD CLEANER CAUTERY TIP 5X5 (MISCELLANEOUS)
PENCIL BUTTON HOLSTER BLD 10FT (ELECTRODE) ×3 IMPLANT
RUBBERBAND STERILE (MISCELLANEOUS) IMPLANT
SHEET MEDIUM DRAPE 40X70 STRL (DRAPES) IMPLANT
SOLUTION PARTIC MCRMTRX 1000MG (Tissue) ×2 IMPLANT
SPONGE GAUZE 2X2 8PLY STRL LF (GAUZE/BANDAGES/DRESSINGS) IMPLANT
SPONGE GAUZE 4X4 12PLY (GAUZE/BANDAGES/DRESSINGS) ×3 IMPLANT
STRIP CLOSURE SKIN 1/2X4 (GAUZE/BANDAGES/DRESSINGS) IMPLANT
SUCTION FRAZIER TIP 10 FR DISP (SUCTIONS) IMPLANT
SUT CHROMIC 4 0 P 3 18 (SUTURE) IMPLANT
SUT ETHILON 4 0 PS 2 18 (SUTURE) IMPLANT
SUT ETHILON 5 0 P 3 18 (SUTURE)
SUT MNCRL 6-0 UNDY P1 1X18 (SUTURE) ×2 IMPLANT
SUT MNCRL AB 3-0 PS2 18 (SUTURE) ×6 IMPLANT
SUT MNCRL AB 4-0 PS2 18 (SUTURE) ×3 IMPLANT
SUT MON AB 5-0 P3 18 (SUTURE) ×3 IMPLANT
SUT MON AB 5-0 PS2 18 (SUTURE) ×3 IMPLANT
SUT MONOCRYL 6-0 P1 1X18 (SUTURE) ×1
SUT NYLON ETHILON 5-0 P-3 1X18 (SUTURE) IMPLANT
SUT PLAIN 5 0 P 3 18 (SUTURE) IMPLANT
SUT VIC AB 5-0 P-3 18X BRD (SUTURE) IMPLANT
SUT VIC AB 5-0 P3 18 (SUTURE)
SUT VICRYL 4-0 PS2 18IN ABS (SUTURE) IMPLANT
SYR BULB 3OZ (MISCELLANEOUS) IMPLANT
SYR CONTROL 10ML LL (SYRINGE) ×3 IMPLANT
TRAY DSU PREP LF (CUSTOM PROCEDURE TRAY) IMPLANT
TUBE CONNECTING 20X1/4 (TUBING) IMPLANT

## 2013-07-29 NOTE — Brief Op Note (Signed)
07/29/2013  9:26 AM  PATIENT:  [name]  78 y.o. male  PRE-OPERATIVE DIAGNOSIS:  Keloid scar of skin  POST-OPERATIVE DIAGNOSIS:  Keloid scar of skin (chest)  PROCEDURE:  Procedure(s) with comments: MINOR EXCISION OF CHEST KELOID CONTRACTURE (N/A) - ACELL placement  SURGEON:  Surgeon(s) and Role:    * Claire Sanger, DO - Primary  PHYSICIAN ASSISTANT: Shawn Rayburn, PA  ASSISTANTS: none   ANESTHESIA:   local  EBL: nil     BLOOD ADMINISTERED:none  DRAINS: none   LOCAL MEDICATIONS USED:  MARCAINE    and XYLOCAINE   SPECIMEN:  Source of Specimen:  chest keloid and cyst  DISPOSITION OF SPECIMEN:  PATHOLOGY  COUNTS:  YES  TOURNIQUET:  * No tourniquets in log *  DICTATION: .Dragon Dictation  PLAN OF CARE: Discharge to home after PACU  PATIENT DISPOSITION:  PACU - hemodynamically stable.   Delay start of Pharmacological VTE agent (>24hrs) due to surgical blood loss or risk of bleeding: no

## 2013-07-29 NOTE — Interval H&P Note (Signed)
History and Physical Interval Note:  07/29/2013 8:15 AM  [name]  has presented today for surgery, with the diagnosis of Keloid scar of skin  The various methods of treatment have been discussed with the patient and family. After consideration of risks, benefits and other options for treatment, the patient has consented to  Procedure(s): MINOR EXCISION OF CHEST KELOID CONTRACTURE (N/A) as a surgical intervention .  The patient's history has been reviewed, patient examined, no change in status, stable for surgery.  I have reviewed the patient's chart and labs.  Questions were answered to the patient's satisfaction.     SANGER,CLAIRE

## 2013-07-29 NOTE — Op Note (Signed)
Operative Note   DATE OF OPERATION: 07/29/2013  SURGICAL DIVISION: Plastic Surgery  PREOPERATIVE DIAGNOSES:  Chest keloid and cyst  POSTOPERATIVE DIAGNOSES:  same  PROCEDURE:  Excision of chest cyst and keloid 2 x 8 cm  SURGEON: [employer], DO  ASSISTANT: Shawn Rayburn, PA  ANESTHESIA:  General.   COMPLICATIONS: None.   INDICATIONS FOR PROCEDURE:  The patient, [name], is a 78 y.o. male born on 03/22/1935, is here for treatment of chest cyst and keloid that was painful.   CONSENT:  Informed consent was obtained directly from the patient. Risks, benefits and alternatives were fully discussed. Specific risks including but not limited to bleeding, infection, hematoma, seroma, scarring, pain, implant infection, implant extrusion, capsular contracture, asymmetry, wound healing problems, and need for further surgery were all discussed. The patient did have an ample opportunity to have questions answered to satisfaction.   DESCRIPTION OF PROCEDURE:  The patient was taken to the operating room. SCDs were placed and IV antibiotics were given. The patient's operative site was prepped and draped in a sterile fashion. A time out was performed and all information was confirmed to be correct.  The local anesthetic was slowly injected at and around the area.  After waiting several minutes for the epinepherine to take effect the #15 blade was used to excise the area.  Undermining was done in all directions for 2 cm to aid in closure.  Hemostasis was achieved with electrocautery. The deep layers were closed with 3-0 Monocryl followed by 4-0 Monocryl.  The skin was closed with 5-0 Monocryl.  Dermabond and a sterile dressing was applied.  The patient tolerated the procedure well.  There were no complications. He was taken to the recovery room in satisfactory condition.

## 2013-07-29 NOTE — H&P (Signed)
This document contains confidential information from a [facility] medical record system and may be unauthenticated. Release may be made only with a valid authorization or in accordance with applicable policies of Medical Center or its affiliates. This document must be maintained in a secure manner or discarded/destroyed as required by Medical Center policy or by a confidential means such as shredding.     [name]  07/27/2013 8:15 AM   Office Visit  MRN:  [id number]   Department:  Plastic Surgery  Dept Phone: [phone]   Description: Male DOB: 08/19/1935  Provider: Fanny Bien Rayburn, PA-C     Diagnoses    Keloid scar of skin    -  Primary   701.4     Vitals - Last Recorded      145/80  64  97.9 F (36.6 C)  1.676 m (5\' 6" )  66.225 kg (146 lb)  23.58 kg/m2     Subjective:    Patient ID: [name] is a 78 y.o. male.  HPI The patient is a 78 yrs old wm here for history and physical for excision of a scar on his chest. History:   He had a skin trauma in his teen years that developed into a keloid.  Over time he has experienced tightening, hardness and pain in the area.  He had it injected several times with kenalog.  The last time it ws injected the area developed an abscess that required antibiotics and CT evaluation.  It was red, swollen and infected for the larger portion of his anterior chest.  Today the redness has resolved.  The medial portion of the keloid has a scab.  It is hard and tight.  There is no infection noted at present. No other keloids noted. He is otherwise in good health.  The following portions of the patient's history were reviewed and updated as appropriate: allergies, current medications, past family history, past medical history, past social history, past surgical history and problem list.  Review of Systems  All other systems reviewed and are negative.     Objective:    Physical Exam  Constitutional: He is oriented to person,  place, and time. He appears well-developed and well-nourished. No distress.  HENT:   Head: Normocephalic and atraumatic.   Nose: Nose normal.   Mouth/Throat: Oropharynx is clear and moist.  Eyes: Conjunctivae and EOM are normal. Pupils are equal, round, and reactive to light.  Neck: Normal Massey of motion. Neck supple. No tracheal deviation present. No thyromegaly present.  Cardiovascular: Normal rate and regular rhythm.  Exam reveals no gallop and no friction rub.    No murmur heard. Pulmonary/Chest: Effort normal. No stridor. No respiratory distress. He has no wheezes. He has no rales. He exhibits tenderness (tender over the central chest keloid to palpation).  Abdominal: Soft. He exhibits no distension and no mass. There is no tenderness.  Neurological: He is alert and oriented to person, place, and time. No cranial nerve deficit. He exhibits normal muscle tone.  Skin: Skin is warm and dry.  Psychiatric: He has a normal mood and affect. His behavior is normal. Judgment and thought content normal.      Assessment:   1.  Keloid scar of skin        Plan:   Recommend excision of scar contracture with Acell placement. The procedure, possible risks, benefits and complications were discussed with the patient and he desires to proceed and consent was obtained.    Medications  Ordered This Encounter     HYDROcodone-acetaminophen (NORCO) 5-325 mg per tablet Take 1 tablet by mouth every 6 (six) hours as needed for up to 10 days for Pain.    cephalexin (KEFLEX) 500 MG  Take 1 capsule (500 mg total) by mouth 4 times daily.

## 2013-07-30 ENCOUNTER — Encounter (HOSPITAL_BASED_OUTPATIENT_CLINIC_OR_DEPARTMENT_OTHER): Payer: Self-pay | Admitting: Plastic Surgery

## 2013-08-04 ENCOUNTER — Telehealth: Payer: Self-pay | Admitting: Neurology

## 2013-08-04 DIAGNOSIS — F10982 Alcohol use, unspecified with alcohol-induced sleep disorder: Secondary | ICD-10-CM

## 2013-08-04 DIAGNOSIS — G4700 Insomnia, unspecified: Secondary | ICD-10-CM

## 2013-08-04 MED ORDER — DOXEPIN HCL 25 MG PO CAPS: 25.0000 mg | ORAL_CAPSULE | Freq: Every day | ORAL | Status: DC

## 2013-08-04 NOTE — Telephone Encounter (Signed)
Dr. Vickey Huger, this patient calls because he has no refills on the Trazodone.  He also indicates that the Trazodone only helps him sleep for 2 to 3 hours before he is awake again.  He is also taking Melatonin in conjunction.  Please advise the patient. [phone]

## 2013-08-06 ENCOUNTER — Telehealth: Payer: Self-pay

## 2013-08-06 NOTE — Telephone Encounter (Signed)
I called the patient to advise a new Rx was called in.  Got no answer.  Left message.

## 2013-08-13 DIAGNOSIS — L910 Hypertrophic scar: Secondary | ICD-10-CM | POA: Diagnosis not present

## 2013-08-19 ENCOUNTER — Encounter: Payer: Self-pay | Admitting: Neurology

## 2013-09-01 ENCOUNTER — Telehealth: Payer: Self-pay | Admitting: *Deleted

## 2013-09-01 ENCOUNTER — Encounter: Payer: Self-pay | Admitting: *Deleted

## 2013-09-01 NOTE — Telephone Encounter (Signed)
Patient calling to let us know that he is doing so much better on the auto machine, he has finally been able to use it without the mask blowing off his face.  He is now wondering which machine to take back.  I contacted [facility] and asked them to contact him and let him know.  We want him to have whatever works best for him but it may just be a matter of changing his current pressure settings.  It just depends on if his current machine is already purchased.

## 2013-09-06 ENCOUNTER — Encounter: Payer: Self-pay | Admitting: Neurology

## 2013-09-07 ENCOUNTER — Encounter: Payer: Self-pay | Admitting: Gastroenterology

## 2013-09-07 ENCOUNTER — Ambulatory Visit (INDEPENDENT_AMBULATORY_CARE_PROVIDER_SITE_OTHER): Payer: Medicare Other | Admitting: Gastroenterology

## 2013-09-07 VITALS — BP 132/64 | HR 67 | Ht 65.5 in | Wt 148.4 lb

## 2013-09-07 DIAGNOSIS — K219 Gastro-esophageal reflux disease without esophagitis: Secondary | ICD-10-CM

## 2013-09-07 MED ORDER — ESOMEPRAZOLE MAGNESIUM 40 MG PO CPDR: 40.0000 mg | DELAYED_RELEASE_CAPSULE | Freq: Every day | ORAL | Status: DC

## 2013-09-07 NOTE — Patient Instructions (Signed)
Please follow up in one year   New prescription for Nexium was sent to your pharmacy

## 2013-09-07 NOTE — Progress Notes (Signed)
This is a very nice 78 year old Caucasian male with chronic GERD dilated in may of 2011. 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 He is done well on daily PPI therapy, Nexium 40 mg a day. He denies any reflux symptoms at this time or dysphagia. His bowels move well, denies melena or hematochezia. His labs are checked by is primary care physician.  Current Medications, Allergies, Past Medical History, Past Surgical History, Family History and Social History were reviewed in [employer] record.  ROS: All systems were reviewed and are negative unless otherwise stated in the HPI.          Physical Exam: Blood pressure 132/64, pulse 67 and regular and weight 148 with a BMI of 24.31. I cannot appreciate stigmata of chronic liver disease. Examination oropharynx is unremarkable. Neck exam is also normal. Chest is clear and he is in a regular rhythm without murmurs gallops or rubs. There is no organomegaly, abdominal masses or tenderness. Bowel sounds are normal. Peripheral extremities are unremarkable and mental status is normal    Assessment and Plan: Chronic GERD complicated by peptic stricture the esophagus which will necessitate long-term PPI therapy. 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 He is doing well currently on Nexium 40 mg a day which have asked him to continue along with standard antireflux maneuvers. We will see him as needed in the future.  Please copy hir primary care physician, referring physician, and pertinent subspecialists.

## 2013-09-08 ENCOUNTER — Other Ambulatory Visit: Payer: Self-pay | Admitting: Neurology

## 2013-09-14 DIAGNOSIS — L910 Hypertrophic scar: Secondary | ICD-10-CM | POA: Diagnosis not present

## 2013-09-16 ENCOUNTER — Other Ambulatory Visit: Payer: Self-pay | Admitting: *Deleted

## 2013-09-16 DIAGNOSIS — G4733 Obstructive sleep apnea (adult) (pediatric): Secondary | ICD-10-CM

## 2013-09-22 ENCOUNTER — Telehealth: Payer: Self-pay | Admitting: Neurology

## 2013-09-22 DIAGNOSIS — F10982 Alcohol use, unspecified with alcohol-induced sleep disorder: Secondary | ICD-10-CM

## 2013-09-22 DIAGNOSIS — G4700 Insomnia, unspecified: Secondary | ICD-10-CM

## 2013-09-22 NOTE — Telephone Encounter (Signed)
Patient is requesting a 90 day supply of doxepin refill,has a f/u appt with Dr Vickey Huger

## 2013-09-23 MED ORDER — DOXEPIN HCL 25 MG PO CAPS: 25.0000 mg | ORAL_CAPSULE | Freq: Every day | ORAL | Status: DC

## 2013-09-23 NOTE — Telephone Encounter (Signed)
Rx sent 

## 2013-09-27 ENCOUNTER — Ambulatory Visit (INDEPENDENT_AMBULATORY_CARE_PROVIDER_SITE_OTHER): Payer: Medicare Other | Admitting: Emergency Medicine

## 2013-09-27 VITALS — BP 128/72 | HR 92 | Temp 99.7°F | Resp 18 | Ht 66.0 in | Wt 144.2 lb

## 2013-09-27 DIAGNOSIS — J020 Streptococcal pharyngitis: Secondary | ICD-10-CM | POA: Diagnosis not present

## 2013-09-27 MED ORDER — PENICILLIN V POTASSIUM 500 MG PO TABS: 500.0000 mg | ORAL_TABLET | Freq: Four times a day (QID) | ORAL | Status: DC

## 2013-09-27 NOTE — Progress Notes (Signed)
[facility] [address] [place] [address] [phone]  Date:  09/27/2013   Name:  [name]   DOB:  1934-11-14   MRN:  [id number]  PCP:  Lucilla Edin, MD    Chief Complaint: Sore Throat   History of Present Illness:  [name] is a 78 y.o. very pleasant male patient who presents with the following:  Sore throat and fever to 101 since last night.  No cough, coryza, nausea or vomiting.  No stool change or rash.  No improvement with over the counter medications or other home remedies. Denies other complaint or health concern today.   Patient Active Problem List   Diagnosis Date Noted  . Sebaceous cyst 07/29/2013  . Sleep apnea with use of continuous positive airway pressure (CPAP) 07/22/2013  . Insomnia, persistent 07/22/2013  . Abnormal chest percussion 07/22/2013  . Memory change 04/19/2013  . Subjective vision disturbance 04/19/2013  . OSA (obstructive sleep apnea) 04/06/2013  . Other symptoms involving digestive system 11/13/2011  . Loss of weight 11/13/2011  . Anal or rectal pain 11/13/2011  . Personal history of colonic polyps 11/13/2011  . HSV 02/16/2010  . DRY EYE SYNDROME 02/16/2010  . ESOPHAGEAL STRICTURE 02/16/2010  . GERD 02/16/2010    Past Medical History  Diagnosis Date  . Barrett's esophagus   . Herpes   . Gout   . Prostatitis   . Cataract   . Glaucoma   . Elevated PSA   . Hypertrophy of prostate without urinary obstruction and other lower urinary tract symptoms (LUTS)   . Vertigo   . Memory change 04/19/2013  . Impotence of organic origin   . Stroke     tia in  2010  . Prostate cancer   . GERD (gastroesophageal reflux disease)   . Insomnia   . HOH (hard of hearing)   . Hiatal hernia   . Diverticulosis of colon (without mention of hemorrhage)   . Esophageal stricture   . History of colon cancer     Past Surgical History  Procedure Laterality Date  . Refractive surgery    . Intracapsular  cataract extraction      Lt eye  . Colonoscopy    . Upper gastrointestinal endoscopy    . Eye surgery      both cataracts  . Mass excision N/A 07/29/2013    Procedure: MINOR EXCISION OF CHEST KELOID CONTRACTURE;  Surgeon: Wayland Denis, DO;  Location: [facility];  Service: Plastics;  Laterality: N/A;  ACELL placement  . Minor graft application N/A 07/29/2013    Procedure: MINOR GRAFT APPLICATION A CELL;  Surgeon: Wayland Denis, DO;  Location: [facility];  Service: Plastics;  Laterality: N/A;    History  Substance Use Topics  . Smoking status: Former Smoker    Quit date: 07/23/1962  . Smokeless tobacco: Never Used     Comment: Stopped in 1963  . Alcohol Use: 7.0 oz/week    3-4 Cans of beer, 14 Drinks containing 0.5 oz of alcohol per week     Comment: 3 to 6 beers-wine a night    Family History  Problem Relation Age of Onset  . Colon cancer Neg Hx   . Cirrhosis Mother     Liver disease  . Jaundice Mother   . Hyperlipidemia Father   . Heart disease Father     Allergies  Allergen Reactions  . Sulfamethoxazole-Tmp Ds  constipation    Medication list has been reviewed and updated.  Current Outpatient Prescriptions on File Prior to Visit  Medication Sig Dispense Refill  . allopurinol (ZYLOPRIM) 100 MG tablet Take 2 tablets daily  180 tablet  3  . aspirin 81 MG tablet Take 81 mg by mouth daily.        . cycloSPORINE (RESTASIS) 0.05 % ophthalmic emulsion 1 drop 2 (two) times daily. As needed.       . doxepin (SINEQUAN) 25 MG capsule Take 1 capsule (25 mg total) by mouth at bedtime.  90 capsule  0  . esomeprazole (NEXIUM) 40 MG capsule Take 1 capsule (40 mg total) by mouth daily before breakfast.  90 capsule  6  . finasteride (PROSCAR) 5 MG tablet Take 5 mg by mouth daily.      . Multiple Vitamins-Minerals (MULTIVITAMIN WITH MINERALS) tablet Take 1 tablet by mouth daily.        . valACYclovir (VALTREX) 1000 MG tablet 1/2 tab daily.  45 tablet  3    No current facility-administered medications on file prior to visit.    Review of Systems:  As per HPI, otherwise negative.    Physical Examination: Filed Vitals:   09/27/13 1931  BP: 128/72  Pulse: 92  Temp: 99.7 F (37.6 C)  Resp: 18   Filed Vitals:   09/27/13 1931  Height: 5\' 6"  (1.676 m)  Weight: 144 lb 3.2 oz (65.409 kg)   Body mass index is 23.29 kg/(m^2). Ideal Body Weight: Weight in (lb) to have BMI = 25: 154.6  GEN: WDWN, NAD, Non-toxic, A & O x 3 HEENT: Atraumatic, Normocephalic. Neck supple. No masses, No LAD.  Strep throat Ears and Nose: No external deformity. CV: RRR, No M/G/R. No JVD. No thrill. No extra heart sounds. PULM: CTA B, no wheezes, crackles, rhonchi. No retractions. No resp. distress. No accessory muscle use. ABD: S, NT, ND, +BS. No rebound. No HSM. EXTR: No c/c/e NEURO Normal gait.  PSYCH: Normally interactive. Conversant. Not depressed or anxious appearing.  Calm demeanor.    Assessment and Plan: Strep throat Pen v k   Signed,  Phillips Odor, MD

## 2013-09-27 NOTE — Patient Instructions (Signed)
Strep Throat  Strep throat is an infection of the throat caused by a bacteria named Streptococcus pyogenes. Your caregiver may call the infection streptococcal "tonsillitis" or "pharyngitis" depending on whether there are signs of inflammation in the tonsils or back of the throat. Strep throat is most common in children aged 76 15 years during the cold months of the year, but it can occur in people of any age during any season. This infection is spread from person to person (contagious) through coughing, sneezing, or other close contact.  SYMPTOMS   · Fever or chills.  · Painful, swollen, red tonsils or throat.  · Pain or difficulty when swallowing.  · White or yellow spots on the tonsils or throat.  · Swollen, tender lymph nodes or "glands" of the neck or under the jaw.  · Red rash all over the body (rare).  DIAGNOSIS   Many different infections can cause the same symptoms. A test must be done to confirm the diagnosis so the right treatment can be given. A "rapid strep test" can help your caregiver make the diagnosis in a few minutes. If this test is not available, a light swab of the infected area can be used for a throat culture test. If a throat culture test is done, results are usually available in a day or two.  TREATMENT   Strep throat is treated with antibiotic medicine.  HOME CARE INSTRUCTIONS   · Gargle with 1 tsp of salt in 1 cup of warm water, 3 4 times per day or as needed for comfort.  · Family members who also have a sore throat or fever should be tested for strep throat and treated with antibiotics if they have the strep infection.  · Make sure everyone in your household washes their hands well.  · Do not share food, drinking cups, or personal items that could cause the infection to spread to others.  · You may need to eat a soft food diet until your sore throat gets better.  · Drink enough water and fluids to keep your urine clear or pale yellow. This will help prevent dehydration.  · Get plenty of  rest.  · Stay home from school, daycare, or work until you have been on antibiotics for 24 hours.  · Only take over-the-counter or prescription medicines for pain, discomfort, or fever as directed by your caregiver.  · If antibiotics are prescribed, take them as directed. Finish them even if you start to feel better.  SEEK MEDICAL CARE IF:   · The glands in your neck continue to enlarge.  · You develop a rash, cough, or earache.  · You cough up green, yellow-brown, or bloody sputum.  · You have pain or discomfort not controlled by medicines.  · Your problems seem to be getting worse rather than better.  SEEK IMMEDIATE MEDICAL CARE IF:   · You develop any new symptoms such as vomiting, severe headache, stiff or painful neck, chest pain, shortness of breath, or trouble swallowing.  · You develop severe throat pain, drooling, or changes in your voice.  · You develop swelling of the neck, or the skin on the neck becomes red and tender.  · You have a fever.  · You develop signs of dehydration, such as fatigue, dry mouth, and decreased urination.  · You become increasingly sleepy, or you cannot wake up completely.  Document Released: 10/18/2000 Document Revised: 10/07/2012 Document Reviewed: 12/20/2010  ExitCare® Patient Information ©2014 [employer].

## 2013-09-30 ENCOUNTER — Telehealth: Payer: Self-pay | Admitting: Radiology

## 2013-09-30 NOTE — Telephone Encounter (Signed)
Patient called indicates he is constipated from the Amox. I advised him to try Miralax and to let us know if this is not helpful. He wants to know if you will change the antibiotic. I explained Amox is the best antibiotic for his strep and he should continue until he has completed the course, he still is asking if you can change this, please advise. I will call him. [phone]

## 2013-09-30 NOTE — Telephone Encounter (Signed)
Called to advise. He states he is not on a decongestant. He is urged to get the Miralax and to take the Antibiotics until they are completely gone.

## 2013-09-30 NOTE — Telephone Encounter (Signed)
Actually causes diarrhea.  Decongestant may be causing constipation

## 2013-10-02 ENCOUNTER — Ambulatory Visit (INDEPENDENT_AMBULATORY_CARE_PROVIDER_SITE_OTHER): Payer: Medicare Other | Admitting: Internal Medicine

## 2013-10-02 VITALS — BP 128/72 | HR 94 | Temp 99.4°F | Resp 18 | Ht 65.0 in | Wt 142.0 lb

## 2013-10-02 DIAGNOSIS — H109 Unspecified conjunctivitis: Secondary | ICD-10-CM | POA: Diagnosis not present

## 2013-10-02 MED ORDER — GENTAMICIN SULFATE 0.3 % OP SOLN: 2.0000 [drp] | OPHTHALMIC | Status: DC

## 2013-10-02 NOTE — Progress Notes (Signed)
Subjective:    Patient ID: [name], male    DOB: [birth year], 78 y.o.   MRN: [id number] This chart was scribed for David Kras, MD by Clydene Laming, ED Scribe. This patient was seen in room 4 and the patient's care was started at 10:39 AM. HPI HPI Comments: [name] is a 78 y.o. male who [facility] complaining of right eye irritation,redness,discharge. He states he has not done anything unusual to the eye.  Pt is currently taking amoxicillin for strep throat and is halfway through his dosage. Around grandkids last week-one with pinkeye. No change in ocular meds.   Patient Active Problem List   Diagnosis Date Noted   Sebaceous cyst 07/29/2013   Sleep apnea with use of continuous positive airway pressure (CPAP) 07/22/2013   Insomnia, persistent 07/22/2013   Abnormal chest percussion 07/22/2013   Memory change 04/19/2013   Subjective vision disturbance 04/19/2013   OSA (obstructive sleep apnea) 04/06/2013   Other symptoms involving digestive system 11/13/2011   Loss of weight 11/13/2011   Anal or rectal pain 11/13/2011   Personal history of colonic polyps 11/13/2011   HSV 02/16/2010   DRY EYE SYNDROME 02/16/2010   ESOPHAGEAL STRICTURE 02/16/2010   GERD 02/16/2010   Past Medical History  Diagnosis Date   Barrett's esophagus    Herpes    Gout    Prostatitis    Cataract    Glaucoma    Elevated PSA    Hypertrophy of prostate without urinary obstruction and other lower urinary tract symptoms (LUTS)    Vertigo    Memory change 04/19/2013   Impotence of organic origin    Stroke     tia in  2010   Prostate cancer    GERD (gastroesophageal reflux disease)    Insomnia    HOH (hard of hearing)    Hiatal hernia    Diverticulosis of colon (without mention of hemorrhage)    Esophageal stricture    History of colon cancer    Past Surgical History  Procedure Laterality Date   Refractive  surgery     Intracapsular cataract extraction      Lt eye   Colonoscopy     Upper gastrointestinal endoscopy     Eye surgery      both cataracts   Mass excision N/A 07/29/2013    Procedure: MINOR EXCISION OF CHEST KELOID CONTRACTURE;  Surgeon: Wayland Denis, DO;  Location: [facility];  Service: Plastics;  Laterality: N/A;  ACELL placement   Minor graft application N/A 07/29/2013    Procedure: MINOR GRAFT APPLICATION A CELL;  Surgeon: Wayland Denis, DO;  Location: [facility];  Service: Plastics;  Laterality: N/A;   Allergies  Allergen Reactions   Sulfamethoxazole-Tmp Ds     constipation   Prior to Admission medications   Medication Sig Start Date End Date Taking? Authorizing Provider  allopurinol (ZYLOPRIM) 100 MG tablet Take 2 tablets daily 04/06/13   Collene Gobble, MD  aspirin 81 MG tablet Take 81 mg by mouth daily.      Historical Provider, MD  cycloSPORINE (RESTASIS) 0.05 % ophthalmic emulsion 1 drop 2 (two) times daily. As needed.     Historical Provider, MD  doxepin (SINEQUAN) 25 MG capsule Take 1 capsule (25 mg total) by mouth at bedtime. 09/23/13   Porfirio Mylar Dohmeier, MD  esomeprazole (NEXIUM) 40 MG capsule Take 1 capsule (40 mg total) by mouth daily before  breakfast. 09/07/13   Mardella Layman, MD  finasteride (PROSCAR) 5 MG tablet Take 5 mg by mouth daily.    Historical Provider, MD  Multiple Vitamins-Minerals (MULTIVITAMIN WITH MINERALS) tablet Take 1 tablet by mouth daily.      Historical Provider, MD  penicillin v potassium (VEETID) 500 MG tablet Take 1 tablet (500 mg total) by mouth 4 (four) times daily. 09/27/13   Phillips Odor, MD  valACYclovir (VALTREX) 1000 MG tablet 1/2 tab daily. 04/06/13   Collene Gobble, MD   History   Social History   Marital Status: Single    Spouse Name: N/A    Number of Children: 2   Years of Education: 14   Occupational History   Retired    Social History Main Topics   Smoking status: Former Smoker      Quit date: 07/23/1962   Smokeless tobacco: Never Used     Comment: Stopped in 1963   Alcohol Use: 7.0 oz/week    3-4 Cans of beer, 14 Drinks containing 0.5 oz of alcohol per week     Comment: 3 to 6 beers-wine a night   Drug Use: No   Sexual Activity: Yes     Comment: 5 partners   Other Topics Concern   Not on file   Social History Narrative   Patient gets regular exercise.    Review of Systems     Objective:   Physical Exam Filed Vitals:   10/02/13 0948  BP: 128/72  Pulse: 94  Temp: 99.4 F (37.4 C)  Resp: 18  Height: 5\' 5"  (1.651 m)  Weight: 142 lb (64.411 kg)  SpO2: 95%   R eye -injected bulbar and palp conjunc w/ purulent d/c PERRLA/EOMs conj L eye clear Nares clear No regional nodes       Assessment & Plan:  10:37 AM- Discussed treatment plan with pt at bedside. Pt verbalized understanding and agreement with plan.  I have completed the patient encounter in its entirety as documented by the scribe, with editing by me where necessary. Robert P. Merla Riches, M.D.  Conjunctivitis -purulent   Plan: Wound culture   Meds ordered this encounter  Medications   gentamicin (GARAMYCIN) 0.3 % ophthalmic solution    Sig: Place 2 drops into the right eye every 4 (four) hours.    Dispense:  5 mL    Refill:  0   Close f/u

## 2013-10-04 LAB — WOUND CULTURE

## 2013-10-06 ENCOUNTER — Ambulatory Visit (INDEPENDENT_AMBULATORY_CARE_PROVIDER_SITE_OTHER): Payer: Medicare Other | Admitting: Emergency Medicine

## 2013-10-06 VITALS — BP 106/70 | HR 64 | Temp 98.6°F | Resp 16 | Ht 66.0 in | Wt 143.4 lb

## 2013-10-06 DIAGNOSIS — L738 Other specified follicular disorders: Secondary | ICD-10-CM

## 2013-10-06 DIAGNOSIS — L739 Follicular disorder, unspecified: Secondary | ICD-10-CM

## 2013-10-06 NOTE — Patient Instructions (Signed)
Folliculitis  Folliculitis is redness, soreness, and swelling (inflammation) of the hair follicles. This condition can occur anywhere on the body. People with weakened immune systems, diabetes, or obesity have a greater risk of getting folliculitis. CAUSES  Bacterial infection. This is the most common cause.  Fungal infection.  Viral infection.  Contact with certain chemicals, especially oils and tars. Long-term folliculitis can result from bacteria that live in the nostrils. The bacteria may trigger multiple outbreaks of folliculitis over time. SYMPTOMS Folliculitis most commonly occurs on the scalp, thighs, legs, back, buttocks, and areas where hair is shaved frequently. An early sign of folliculitis is a small, white or yellow, pus-filled, itchy lesion (pustule). These lesions appear on a red, inflamed follicle. They are usually less than 0.2 inches (5 mm) wide. When there is an infection of the follicle that goes deeper, it becomes a boil or furuncle. A group of closely packed boils creates a larger lesion (carbuncle). Carbuncles tend to occur in hairy, sweaty areas of the body. DIAGNOSIS  Your caregiver can usually tell what is wrong by doing a physical exam. A sample may be taken from one of the lesions and tested in a lab. This can help determine what is causing your folliculitis. TREATMENT  Treatment may include:  Applying warm compresses to the affected areas.  Taking antibiotic medicines orally or applying them to the skin.  Draining the lesions if they contain a large amount of pus or fluid.  Laser hair removal for cases of long-lasting folliculitis. This helps to prevent regrowth of the hair. HOME CARE INSTRUCTIONS  Apply warm compresses to the affected areas as directed by your caregiver.  If antibiotics are prescribed, take them as directed. Finish them even if you start to feel better.  You may take over-the-counter medicines to relieve itching.  Do not shave  irritated skin.  Follow up with your caregiver as directed. SEEK IMMEDIATE MEDICAL CARE IF:   You have increasing redness, swelling, or pain in the affected area.  You have a fever. MAKE SURE YOU:  Understand these instructions.  Will watch your condition.  Will get help right away if you are not doing well or get worse. Document Released: 12/30/2001 Document Revised: 04/21/2012 Document Reviewed: 01/21/2012 ExitCare Patient Information 2014 [employer].  

## 2013-10-06 NOTE — Progress Notes (Signed)
[facility] [address] [place] [address] [phone]  Date:  10/06/2013   Name:  [name]   DOB:  11-Jan-1935   MRN:  [id number]  PCP:  Lucilla Edin, MD    Chief Complaint: Rash   History of Present Illness:  [name] is a 78 y.o. very pleasant male patient who presents with the following:  Concerned that he may have shingles.  Has a rash on his right flank that he noticed.  No pain or tenderness. No fever or chills. No nausea or vomiting.  No pleuritic component.  No improvement with over the counter medications or other home remedies. Denies other complaint or health concern today.   Patient Active Problem List   Diagnosis Date Noted  . Sebaceous cyst 07/29/2013  . Sleep apnea with use of continuous positive airway pressure (CPAP) 07/22/2013  . Insomnia, persistent 07/22/2013  . Abnormal chest percussion 07/22/2013  . Memory change 04/19/2013  . Subjective vision disturbance 04/19/2013  . OSA (obstructive sleep apnea) 04/06/2013  . Other symptoms involving digestive system 11/13/2011  . Loss of weight 11/13/2011  . Anal or rectal pain 11/13/2011  . Personal history of colonic polyps 11/13/2011  . HSV 02/16/2010  . DRY EYE SYNDROME 02/16/2010  . ESOPHAGEAL STRICTURE 02/16/2010  . GERD 02/16/2010    Past Medical History  Diagnosis Date  . Barrett's esophagus   . Herpes   . Gout   . Prostatitis   . Cataract   . Glaucoma   . Elevated PSA   . Hypertrophy of prostate without urinary obstruction and other lower urinary tract symptoms (LUTS)   . Vertigo   . Memory change 04/19/2013  . Impotence of organic origin   . Stroke     tia in  2010  . Prostate cancer   . GERD (gastroesophageal reflux disease)   . Insomnia   . HOH (hard of hearing)   . Hiatal hernia   . Diverticulosis of colon (without mention of hemorrhage)   . Esophageal stricture   . History of colon cancer     Past Surgical History  Procedure  Laterality Date  . Refractive surgery    . Intracapsular cataract extraction      Lt eye  . Colonoscopy    . Upper gastrointestinal endoscopy    . Eye surgery      both cataracts  . Mass excision N/A 07/29/2013    Procedure: MINOR EXCISION OF CHEST KELOID CONTRACTURE;  Surgeon: Wayland Denis, DO;  Location: [facility];  Service: Plastics;  Laterality: N/A;  ACELL placement  . Minor graft application N/A 07/29/2013    Procedure: MINOR GRAFT APPLICATION A CELL;  Surgeon: Wayland Denis, DO;  Location: [facility];  Service: Plastics;  Laterality: N/A;    History  Substance Use Topics  . Smoking status: Former Smoker    Quit date: 07/23/1962  . Smokeless tobacco: Never Used     Comment: Stopped in 1963  . Alcohol Use: 7.0 oz/week    3-4 Cans of beer, 14 Drinks containing 0.5 oz of alcohol per week     Comment: 3 to 6 beers-wine a night    Family History  Problem Relation Age of Onset  . Colon cancer Neg Hx   . Cirrhosis Mother     Liver disease  . Jaundice Mother   . Hyperlipidemia Father   . Heart disease Father  Allergies  Allergen Reactions  . Sulfamethoxazole-Tmp Ds     constipation    Medication list has been reviewed and updated.  Current Outpatient Prescriptions on File Prior to Visit  Medication Sig Dispense Refill  . allopurinol (ZYLOPRIM) 100 MG tablet Take 2 tablets daily  180 tablet  3  . aspirin 81 MG tablet Take 81 mg by mouth daily.        . cycloSPORINE (RESTASIS) 0.05 % ophthalmic emulsion 1 drop 2 (two) times daily. As needed.       . doxepin (SINEQUAN) 25 MG capsule Take 1 capsule (25 mg total) by mouth at bedtime.  90 capsule  0  . esomeprazole (NEXIUM) 40 MG capsule Take 1 capsule (40 mg total) by mouth daily before breakfast.  90 capsule  6  . finasteride (PROSCAR) 5 MG tablet Take 5 mg by mouth daily.      Marland Kitchen gentamicin (GARAMYCIN) 0.3 % ophthalmic solution Place 2 drops into the right eye every 4 (four) hours.  5 mL   0  . Multiple Vitamins-Minerals (MULTIVITAMIN WITH MINERALS) tablet Take 1 tablet by mouth daily.        . penicillin v potassium (VEETID) 500 MG tablet Take 1 tablet (500 mg total) by mouth 4 (four) times daily.  40 tablet  0  . valACYclovir (VALTREX) 1000 MG tablet 1/2 tab daily.  45 tablet  3   No current facility-administered medications on file prior to visit.    Review of Systems:  As per HPI, otherwise negative.    Physical Examination: Filed Vitals:   10/06/13 1955  BP: 106/70  Pulse: 64  Temp:   Resp:    Filed Vitals:   10/06/13 1920  Height: 5\' 6"  (1.676 m)  Weight: 143 lb 6.4 oz (65.046 kg)   Body mass index is 23.16 kg/(m^2). Ideal Body Weight: Weight in (lb) to have BMI = 25: 154.6   GEN: WDWN, NAD, Non-toxic, Alert & Oriented x 3 HEENT: Atraumatic, Normocephalic.  Ears and Nose: No external deformity. EXTR: No clubbing/cyanosis/edema NEURO: Normal gait.  PSYCH: Normally interactive. Conversant. Not depressed or anxious appearing.  Calm demeanor.  CHEST:  Clear BS = SKIN:  No evidence shingles  Mild folliculitis    Assessment and Plan: Folliculitis  Signed,  Phillips Odor, MD

## 2013-10-07 ENCOUNTER — Ambulatory Visit: Payer: Medicare Other | Admitting: Neurology

## 2013-10-29 ENCOUNTER — Ambulatory Visit (INDEPENDENT_AMBULATORY_CARE_PROVIDER_SITE_OTHER): Payer: Medicare Other | Admitting: Neurology

## 2013-10-29 ENCOUNTER — Encounter (INDEPENDENT_AMBULATORY_CARE_PROVIDER_SITE_OTHER): Payer: Self-pay

## 2013-10-29 ENCOUNTER — Encounter: Payer: Self-pay | Admitting: Neurology

## 2013-10-29 VITALS — BP 115/74 | HR 77 | Temp 97.1°F | Ht 65.5 in | Wt 149.0 lb

## 2013-10-29 DIAGNOSIS — H5310 Unspecified subjective visual disturbances: Secondary | ICD-10-CM | POA: Diagnosis not present

## 2013-10-29 DIAGNOSIS — R413 Other amnesia: Secondary | ICD-10-CM | POA: Diagnosis not present

## 2013-10-29 MED ORDER — MIRTAZAPINE 30 MG PO TABS: 30.0000 mg | ORAL_TABLET | Freq: Every day | ORAL | Status: DC

## 2013-10-29 NOTE — Progress Notes (Signed)
Reason for visit: Memory disturbance  AMAN BONET III is an 78 y.o. male  History of present illness:  Mr. Santoli is a 78 year old right-handed white male with a history of some problems with visual blurring. The patient indicates that his vision has normalized at this point, and he attributes the visual blurring to aspirin use. The patient went on doxepin for sleep taking 25 mg at night 2 months ago, but he indicates that this did not alter his vision. The patient has some problems with short-term memory, and he scored 25/30 on the Mini-Mental status examination on his last revisit. The patient has not altered any of his activities of daily living secondary to his memory. The patient indicates that he operates a motor vehicle without difficulty, and he reports no problems with directions while driving. The patient is not sure whether or not his memory has worsened on the doxepin. The patient sleeps better on this medication. The patient is followed for obstructive sleep apnea. The patient returns for an evaluation.  Past Medical History  Diagnosis Date  . Barrett's esophagus   . Herpes   . Gout   . Prostatitis   . Cataract   . Glaucoma   . Elevated PSA   . Hypertrophy of prostate without urinary obstruction and other lower urinary tract symptoms (LUTS)   . Vertigo   . Memory change 04/19/2013  . Impotence of organic origin   . Stroke     tia in  2010  . Prostate cancer   . GERD (gastroesophageal reflux disease)   . Insomnia   . HOH (hard of hearing)   . Hiatal hernia   . Diverticulosis of colon (without mention of hemorrhage)   . Esophageal stricture   . History of colon cancer     Past Surgical History  Procedure Laterality Date  . Refractive surgery    . Intracapsular cataract extraction      Lt eye  . Colonoscopy    . Upper gastrointestinal endoscopy    . Eye surgery      both cataracts  . Mass excision N/A 07/29/2013    Procedure: MINOR EXCISION OF CHEST  KELOID CONTRACTURE;  Surgeon: Wayland Denis, DO;  Location: [facility];  Service: Plastics;  Laterality: N/A;  ACELL placement  . Minor graft application N/A 07/29/2013    Procedure: MINOR GRAFT APPLICATION A CELL;  Surgeon: Wayland Denis, DO;  Location: [facility];  Service: Plastics;  Laterality: N/A;    Family History  Problem Relation Age of Onset  . Colon cancer Neg Hx   . Cirrhosis Mother     Liver disease  . Jaundice Mother   . Hyperlipidemia Father   . Heart disease Father     Social history:  reports that he quit smoking about 51 years ago. He has never used smokeless tobacco. He reports that he drinks about 7.0 ounces of alcohol per week. He reports that he does not use illicit drugs.    Allergies  Allergen Reactions  . Sulfamethoxazole-Tmp Ds     constipation    Medications:  Current Outpatient Prescriptions on File Prior to Visit  Medication Sig Dispense Refill  . allopurinol (ZYLOPRIM) 100 MG tablet Take 2 tablets daily  180 tablet  3  . aspirin 81 MG tablet Take 81 mg by mouth daily.        . cycloSPORINE (RESTASIS) 0.05 % ophthalmic emulsion 1 drop 2 (two) times daily. As needed.       Marland Kitchen  esomeprazole (NEXIUM) 40 MG capsule Take 1 capsule (40 mg total) by mouth daily before breakfast.  90 capsule  6  . finasteride (PROSCAR) 5 MG tablet Take 5 mg by mouth daily.      . Multiple Vitamins-Minerals (MULTIVITAMIN WITH MINERALS) tablet Take 1 tablet by mouth daily.        . valACYclovir (VALTREX) 1000 MG tablet 1/2 tab daily.  45 tablet  3   No current facility-administered medications on file prior to visit.    ROS:  Out of a complete 14 system review of symptoms, the patient complains only of the following symptoms, and all other reviewed systems are negative.  Hearing loss Apnea Memory loss  Blood pressure 115/74, pulse 77, temperature 97.1 F (36.2 C), temperature source Oral, height 5' 5.5" (1.664 m), weight 149 lb (67.586  kg).  Physical Exam  General: The patient is alert and cooperative at the time of the examination.  Skin: No significant peripheral edema is noted.   Neurologic Exam  Mental status: The Mini-Mental status examination done today shows a total score 30/30.  Cranial nerves: Facial symmetry is present. Speech is normal, no aphasia or dysarthria is noted. Extraocular movements are full. Visual fields are full.  Motor: The patient has good strength in all 4 extremities.  Sensory examination: Soft touch sensation on the face, arms, and legs is symmetric.  Coordination: The patient has good finger-nose-finger and heel-to-shin bilaterally.  Gait and station: The patient has a normal gait. Tandem gait is normal. Romberg is negative. No drift is seen.  Reflexes: Deep tendon reflexes are symmetric.   Assessment/Plan:  1. Memory disturbance  2. Mild sleep apnea  3. History of insomnia  The patient will be switched from doxepin to mirtazapine at night for sleep. The mirtazapine does not have anti-cholinergic effects as does the doxepin, which may go against the memory. I have once again discussed the possibility of starting a medication such as Aricept, but it is not clear that the patient wants to be on these medications at this time. The patient will followup in 6-8 months.  Marlan Palau MD 10/29/2013 6:39 PM  [facility] [address] [place], [place] [address]  Phone [phone] Fax [phone]

## 2013-11-18 DIAGNOSIS — H04129 Dry eye syndrome of unspecified lacrimal gland: Secondary | ICD-10-CM | POA: Diagnosis not present

## 2013-11-18 DIAGNOSIS — H26499 Other secondary cataract, unspecified eye: Secondary | ICD-10-CM | POA: Diagnosis not present

## 2013-11-24 ENCOUNTER — Ambulatory Visit (INDEPENDENT_AMBULATORY_CARE_PROVIDER_SITE_OTHER): Payer: Medicare Other | Admitting: Neurology

## 2013-11-24 ENCOUNTER — Encounter: Payer: Self-pay | Admitting: Neurology

## 2013-11-24 VITALS — BP 126/74 | HR 77 | Resp 16 | Ht 67.0 in | Wt 151.0 lb

## 2013-11-24 DIAGNOSIS — G4733 Obstructive sleep apnea (adult) (pediatric): Secondary | ICD-10-CM | POA: Insufficient documentation

## 2013-11-24 DIAGNOSIS — G4710 Hypersomnia, unspecified: Secondary | ICD-10-CM

## 2013-11-24 DIAGNOSIS — G4700 Insomnia, unspecified: Secondary | ICD-10-CM | POA: Diagnosis not present

## 2013-11-24 DIAGNOSIS — Z9989 Dependence on other enabling machines and devices: Principal | ICD-10-CM

## 2013-11-24 HISTORY — DX: Hypersomnia, unspecified: G47.10

## 2013-11-24 MED ORDER — MIRTAZAPINE 30 MG PO TABS: 30.0000 mg | ORAL_TABLET | Freq: Every day | ORAL | Status: DC

## 2013-11-24 NOTE — Patient Instructions (Signed)
Your CPAP compliance is excellent, and resolution of apnea is very high.  Residual AHi of less than one.   Continue using the CPAP. You will follow up with Dr. [name] as your primary neurologist, while the sleep sees you once a year.

## 2013-11-24 NOTE — Progress Notes (Signed)
[facility]  Provider:  Larey Seat, M D  Referring Provider: [name], MD Primary Care Physician:  Jenny Reichmann, MD  Chief Complaint  Patient presents with  . Follow-up    Room 11  . Sleep Apnea    MMSE 28/30  . Memory Loss    AFT -16    HPI:  [name] is a 79 y.o. male  Is seen here as a referral/ revisit  from Dr. Everlene Farrier for CPAP compliance.    Mr. threats is also followed by Dr. [name] as his primary neurologist, by he was last evaluated on December 26 14 for memory loss. Dr. [name] exchanged the sleep aid trials and on 100 mg at night was mirtazapine. The patient feels that this has given him sufficient and restorative sleep and may have lifted some of the fog he had felt in daytime. He had tried trazodone earlier in  2014 and at the time to was not effective.  He is no longer complaining about insomnia however on the current regimen. Today's Mini-Mental Status Examination was 28/30 points and next time be followed by [facility]. The patient further and Tamela Oddi the geriatric depression score at points which does not indicate depression to be present, the Epworth Sleepiness Scale at [facility] and the fatigue severity score at 20 compliance.  The patient brought her CPAP machine for a download. He is on Ultracet titration beginning pressure 6 cm water maximum pressure of 15 cm water the 95th percentile pressure is 11.4 the residual AHI is 0.4 the total compliance is 97% for days used over 4 hours,  and the average time in CPAP therapy at 7 hours and 15 minutes. The mask leak is moderate. He is using a nasal mask with good success and affect. The also owns a dental device but suppresses the tongue, manufactured by Dr. Ron Parker. The patient wears dentures but when he went taken out at night did not leave enough facial stability for a full face mask.he uses a chin strap now with a nasal mask.            Last visit note.  Based on a home sleep test   From  11-13-12, which showed mild sleep apnea, the patient returned  for a full night study titration in the standard setting and  the AHI was much higher than initially thought.  His AHI was 45.4 supine AHI 18.5 the baseline study was performed on 11-23-2012 referring physician was Dr. Ivar Bury. Primary neurologist is Dr. Floyde Parkins.  Patient returned for an adult CPAP titration on 7-8 2014. Please not that the patient had another TIA that made the titration more urgent, presuming that the CPAP reduces OSA as  risk factors for repeated strokes.  The patient endorsed  the Epworth sleepiness score at 10 points,  at a BMI of 25 , neck circumference of 13.75 inches.  AHI was now 13.7 , overall RDI 16.2,  supine AHI was 13.8 - the patient was titrated to 16 cm water, but had difficulties tolerating the face mask and a small size. An expression of  poor tolerance can be seen in increasing PLM s. Today missed a that reports began that he has trouble initiating maintaining sleep even with trazodone and melatonin he may just get one and a half hours of sleep and once he wakes up it sometimes not able to get any for is asleep through the night. His fatigue severity scale  is endorsed at 13.7 with Epworth at 4 points his lumbar using Ambien or NyQuil Vicodin.  The CPAP compliance was reviewed over various downloads and today's download in office. CPAP was initiated on 05/24/2013 his first 30 day download short a 97% compliance, a residual AHI of 5.5 at a set pressure of 16 cm water he did have a lot of air leak problems during the night and therefore his fullface mask was changed to a nasal mask model. It is TE time off therapy was 6 hours and 18 minutes dated 06-23-39. The patient is followed by advanced on care if his D. and [place]. In office download from 07/20/2013 shows an AHI of 1.9 much reduced in comparison to before again the pressure still at 16 cm water the patient uses his machine for 6 hours and  47 minutes on average he has some residual obstructive apneas but not central events.  Air leak has remained high. We have meeting today to see off he can be using a reduced  CPAP pressure,   and also to help him sustain sleep longer.  He has been watching TV when going to bed, and is living alone, retired . He  naps in daytime sometimes, 30-60 minutes. Caffeine intake  throughout the morning, alcohol in PM. He has a large keloid on his chaest wall,which was infected earlier last month and treated with ATB. This may have an effect on his breathing, too.     Review of Systems: Out of a complete 14 system review, the patient complains of only the following symptoms, and all other reviewed systems are negative.  epworth 10-13 , today 12.  MMSE 26-30   History   Social History  . Marital Status: Single    Spouse Name: N/A    Number of Children: 2  . Years of Education: 15   Occupational History  . Retired    Social History Main Topics  . Smoking status: Former Smoker    Quit date: 07/23/1962  . Smokeless tobacco: Never Used     Comment: Stopped in 1963  . Alcohol Use: 7.0 oz/week    3-4 Cans of beer, 14 Drinks containing 0.5 oz of alcohol per week     Comment: 2-3 drinks nightly  . Drug Use: No  . Sexual Activity: Yes     Comment: 5 partners   Other Topics Concern  . Not on file   Social History Narrative   Patient is single.   Patient has two children.   Patient is retired.   Patient has a college education.   Patient is right-handed.   Patient drinks 3-4 cups of coffee daily.   Patient gets regular exercise.    Family History  Problem Relation Age of Onset  . Colon cancer Neg Hx   . Cirrhosis Mother     Liver disease  . Jaundice Mother   . Hyperlipidemia Father   . Heart disease Father     Past Medical History  Diagnosis Date  . Barrett's esophagus   . Herpes   . Gout   . Prostatitis   . Cataract   . Glaucoma   . Elevated PSA   . Hypertrophy of  prostate without urinary obstruction and other lower urinary tract symptoms (LUTS)   . Vertigo   . Memory change 04/19/2013  . Impotence of organic origin   . Stroke     tia in  2010  . Prostate cancer   . GERD (gastroesophageal reflux  disease)   . Insomnia   . HOH (hard of hearing)   . Hiatal hernia   . Diverticulosis of colon (without mention of hemorrhage)   . Esophageal stricture   . History of colon cancer   . Hypersomnia, persistent 11/24/2013    Reports Epworth of 10 and higher on CPAP with  good compliance.     Past Surgical History  Procedure Laterality Date  . Refractive surgery    . Intracapsular cataract extraction      Lt eye  . Colonoscopy    . Upper gastrointestinal endoscopy    . Eye surgery      both cataracts  . Mass excision N/A 07/29/2013    Procedure: MINOR EXCISION OF CHEST KELOID CONTRACTURE;  Surgeon: Theodoro Kos, DO;  Location: [facility];  Service: Plastics;  Laterality: N/A;  ACELL placement  . Minor graft application N/A 02/20/6221    Procedure: MINOR GRAFT APPLICATION A CELL;  Surgeon: Theodoro Kos, DO;  Location: [facility];  Service: Plastics;  Laterality: N/A;    Current Outpatient Prescriptions  Medication Sig Dispense Refill  . allopurinol (ZYLOPRIM) 100 MG tablet Take 2 tablets daily  180 tablet  3  . aspirin 81 MG tablet Take 81 mg by mouth daily.        . cycloSPORINE (RESTASIS) 0.05 % ophthalmic emulsion 1 drop 2 (two) times daily. As needed.       Marland Kitchen esomeprazole (NEXIUM) 40 MG capsule Take 1 capsule (40 mg total) by mouth daily before breakfast.  90 capsule  6  . finasteride (PROSCAR) 5 MG tablet Take 5 mg by mouth daily.      . mirtazapine (REMERON) 30 MG tablet Take 1 tablet (30 mg total) by mouth at bedtime.  30 tablet  2  . Multiple Vitamins-Minerals (MULTIVITAMIN WITH MINERALS) tablet Take 1 tablet by mouth daily.        . valACYclovir (VALTREX) 1000 MG tablet 1/2 tab daily.  45 tablet  3   No current  facility-administered medications for this visit.    Allergies as of 11/24/2013 - Review Complete 11/24/2013  Allergen Reaction Noted  . Sulfamethoxazole Other (See Comments) 11/24/2013  . Sulfamethoxazole-tmp ds  08/11/2012    Vitals: BP 126/74  Pulse 77  Resp 16  Ht 5\' 7"  (1.702 m)  Wt 151 lb (68.493 kg)  BMI 23.64 kg/m2 Last Weight:  Wt Readings from Last 1 Encounters:  11/24/13 151 lb (68.493 kg)   Last Height:   Ht Readings from Last 1 Encounters:  11/24/13 5\' 7"  (1.702 m)    Physical exam:  General: The patient is awake, alert and appears not in acute distress. The patient is well groomed. Head: Normocephalic, atraumatic. Neck is supple.  Plan:  Treatment plan and additional workup : Blurred vision  Snoring  Insomnia  Blood pressure 161/89, pulse 70, height 5' 6.25" (1.683 m), weight 144 lb (65.318 kg).   Physical Exam  General: The patient is alert and cooperative at the time of the examination.  Head: Pupils are equal, round, and reactive to light. Discs are flat bilaterally.  Neck: The neck is supple, no carotid bruits are noted.  Respiratory: The respiratory examination is clear.  Cardiovascular: The cardiovascular examination reveals a regular rate and rhythm, no obvious murmurs or rubs are noted.  Skin: Extremities are without significant edema.  Neurologic Exam  Mental status: Mini-Mental status examination done today shows a total score 26/30. The patient is able to name  11 animals in 60 seconds.  Cranial nerves: Facial symmetry is present. There is good sensation of the face to pinprick and soft touch bilaterally. The strength of the facial muscles and the muscles to head turning and shoulder shrug are normal bilaterally. Speech is well enunciated, no aphasia or dysarthria is noted. Extraocular movements are full. Visual fields are full.  Motor: The motor testing reveals 5 over 5 strength of all 4 extremities. Sensory: Sensory testing is intact to  pinprick, soft touch, vibration sensation, and position sense on all 4 extremities. No evidence of extinction is noted.  Coordination: Cerebellar testing reveals good finger-nose-finger and heel-to-shin bilaterally.  Gait and station: Gait is normal. Tandem gait is normal. Romberg is negative. No drift is seen.  Reflexes: Deep tendon reflexes are symmetric and normal bilaterally,. Toes are downgoing bilaterally.   Assessment/Plan:  1 hypersomnia with OSA, )the patient endorsed again 12 points on Epworth, but has very good resolution on auto set CPAP between 6 and 15 cm , with the 95% pressure  being 11.5 cm water , [facility] is DME , nasal mask works well.  .   2)insomnia due to alcohol use  and  Poor sleep hygiene , chronic for many years. He reports this has resolved now, partially due to treated apnea.     Patient is 97%  compliant. Rv in 12 month for CPAP compliance.     Harkirat Orozco, MD

## 2013-11-29 ENCOUNTER — Encounter: Payer: Self-pay | Admitting: Neurology

## 2013-12-20 DIAGNOSIS — C61 Malignant neoplasm of prostate: Secondary | ICD-10-CM | POA: Diagnosis not present

## 2013-12-22 DIAGNOSIS — C61 Malignant neoplasm of prostate: Secondary | ICD-10-CM | POA: Diagnosis not present

## 2013-12-22 DIAGNOSIS — N400 Enlarged prostate without lower urinary tract symptoms: Secondary | ICD-10-CM | POA: Diagnosis not present

## 2013-12-22 DIAGNOSIS — N529 Male erectile dysfunction, unspecified: Secondary | ICD-10-CM | POA: Diagnosis not present

## 2014-02-01 IMAGING — CT CT HEAD W/O CM
1 series · 16 of 30 positions shown, 20 images · non-contrast
Comparison: Brain MRI 04/26/2013.

CLINICAL DATA: Tremors.  Tremors occurring with sleep.

CT HEAD WITHOUT CONTRAST
TECHNIQUE: Contiguous axial images were obtained from the base of
the skull through the vertex without contrast.

[Series 2: head 4.8 h37s · axial · 0.45mm/px · z∈[-137,+19]mm · 16 of 36 slices shown, 20 images]
[im 2/36  brain]
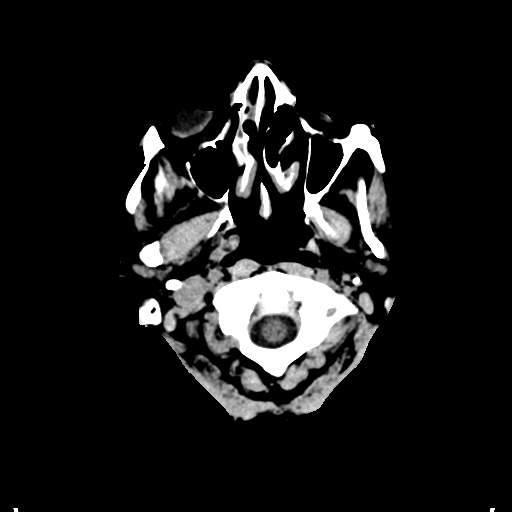
[im 2/36  bone]
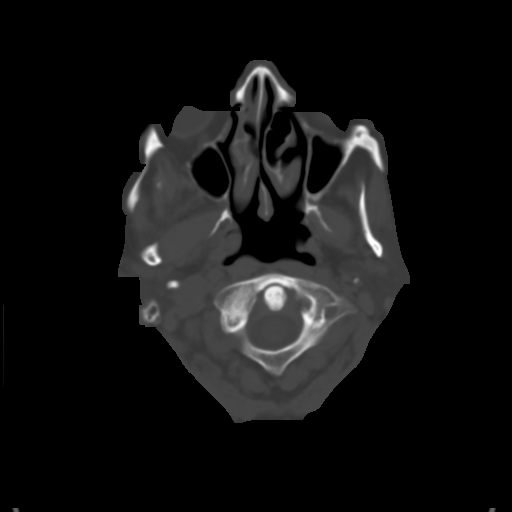
[im 4/36  brain]
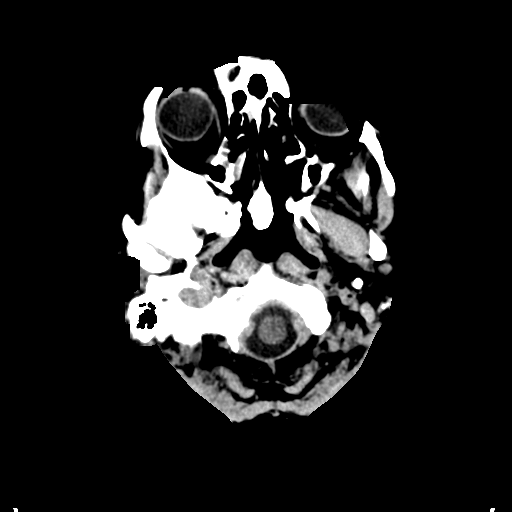
[im 7/36  brain]
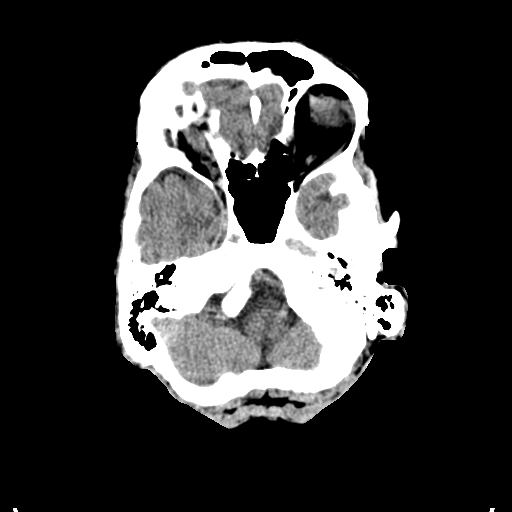
[im 9/36  brain]
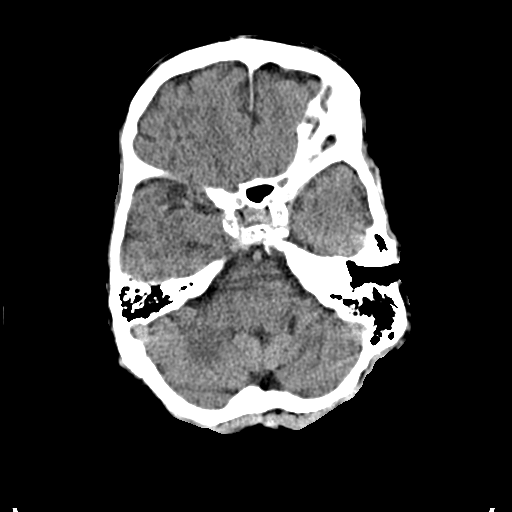
[im 10/36  brain]
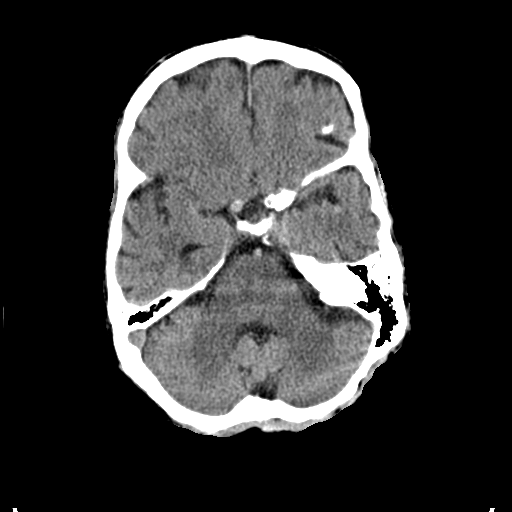
[im 10/36  bone]
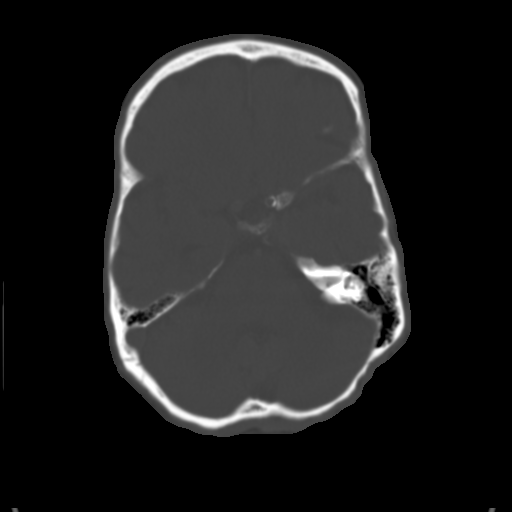
[im 13/36  brain]
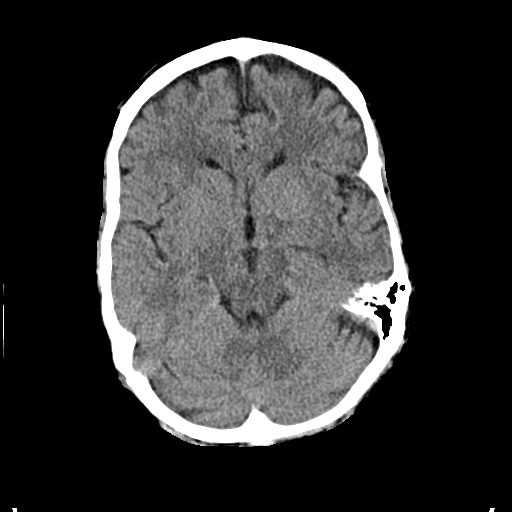
[im 15/36  brain]
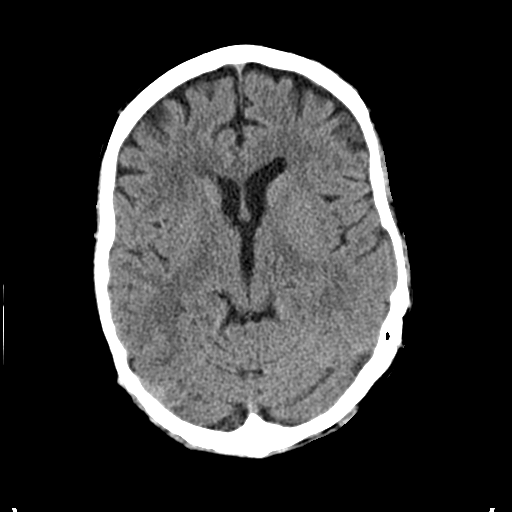
[im 17/36  brain]
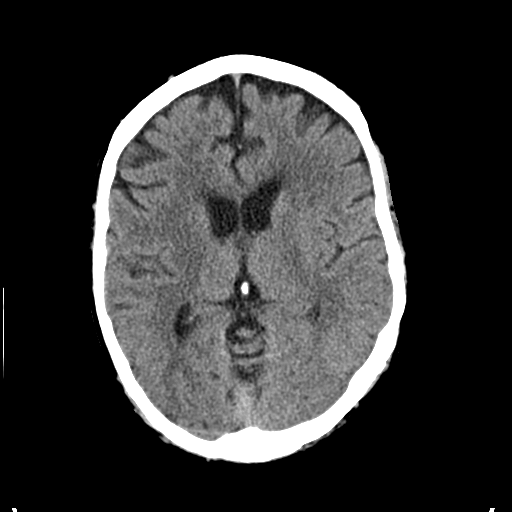
[im 19/36  brain]
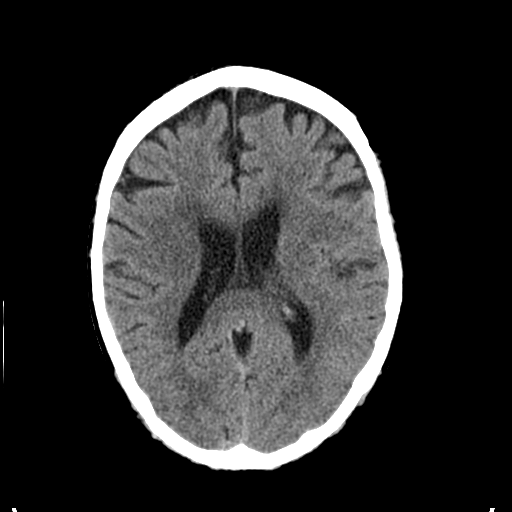
[im 19/36  bone]
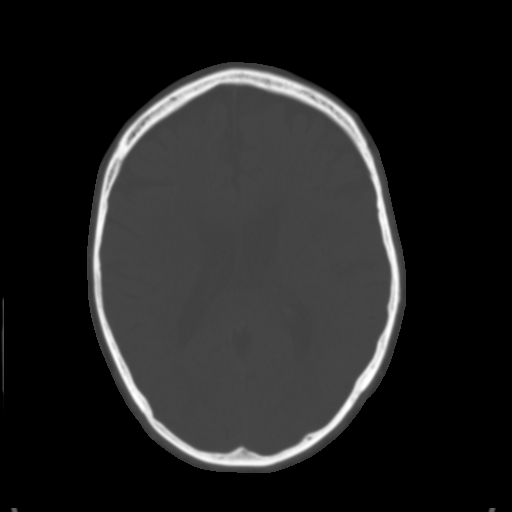
[im 21/36  brain]
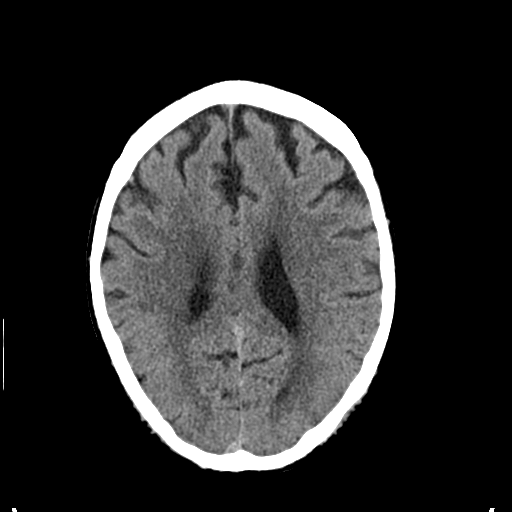
[im 23/36  brain]
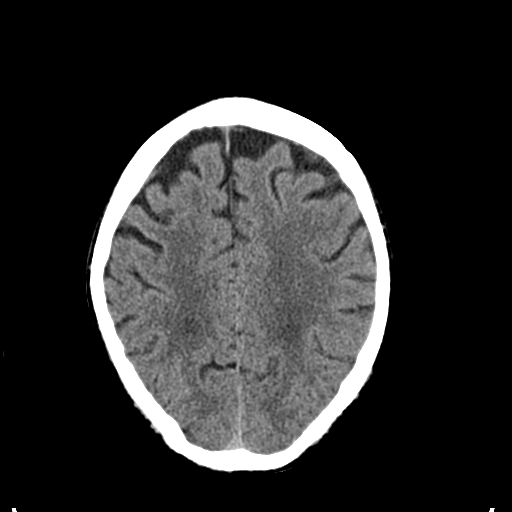
[im 26/36  brain]
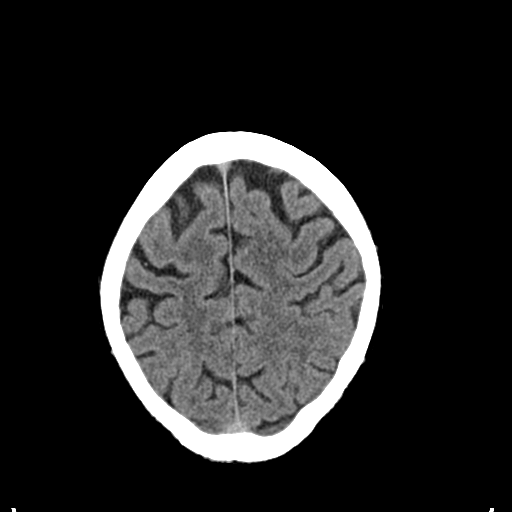
[im 27/36  brain]
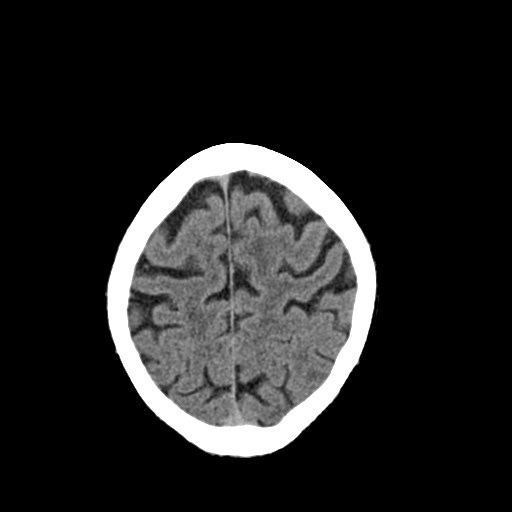
[im 27/36  bone]
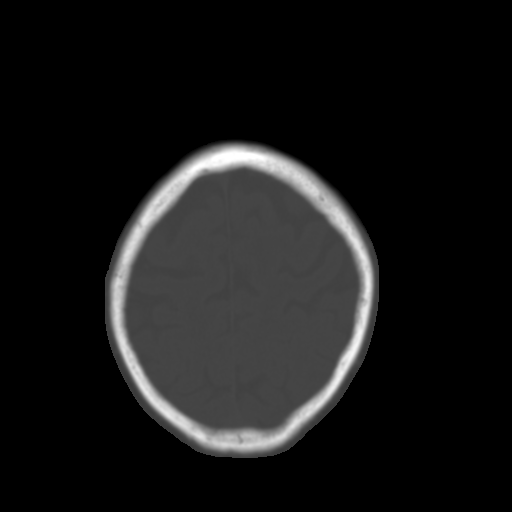
[im 29/36  brain]
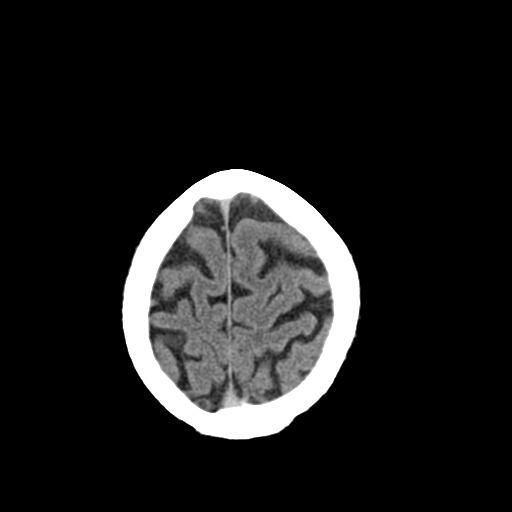
[im 32/36  brain]
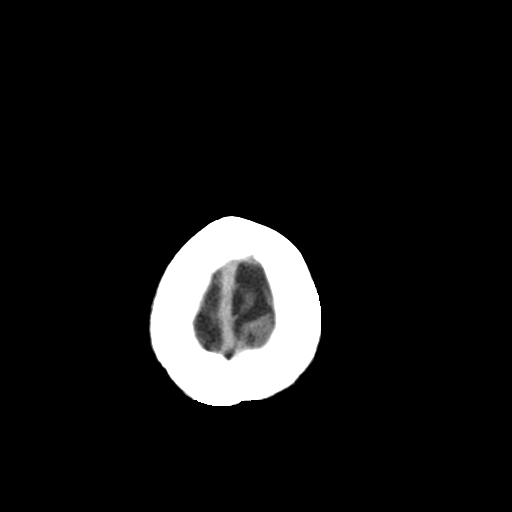
[im 34/36  brain]
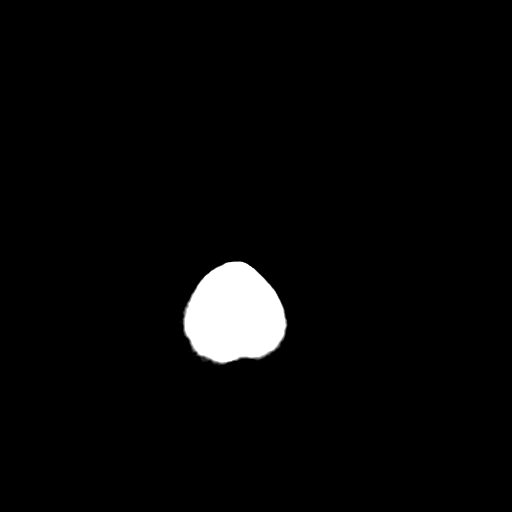

[16 of 30 positions shown; findings below may reference images not displayed]

FINDINGS: Calvarium:No acute osseous abnormality. No lytic or blastic lesion.

Orbits: Bilateral cataract resection.

Sinuses: Concha bullosa in the left middle turbinate.  Scattered
inflammatory mucosal thickening in the ethmoid sinuses, with
minimal retained frothy secretions in the left frontal sinus.  The
appearance is stable to improved from preceding brain MRI.

Brain: No evidence of acute abnormality, such as acute infarction,
hemorrhage, hydrocephalus, or mass lesion/mass effect.Extensive
intracranial calcified atherosclerosis. Scattered cerebral white
matter low attenuation, usually related to chronic small vessel
ischemia.
IMPRESSION: 1.  No evidence of acute intracranial disease.
2.  Senescent changes including chronic small vessel ischemia.

## 2014-02-07 DIAGNOSIS — C61 Malignant neoplasm of prostate: Secondary | ICD-10-CM | POA: Diagnosis not present

## 2014-02-23 ENCOUNTER — Ambulatory Visit: Payer: Medicare Other | Admitting: Neurology

## 2014-04-14 ENCOUNTER — Encounter: Payer: Self-pay | Admitting: Emergency Medicine

## 2014-04-14 ENCOUNTER — Other Ambulatory Visit: Payer: Self-pay | Admitting: Emergency Medicine

## 2014-04-14 ENCOUNTER — Ambulatory Visit (INDEPENDENT_AMBULATORY_CARE_PROVIDER_SITE_OTHER): Payer: Medicare Other | Admitting: Emergency Medicine

## 2014-04-14 VITALS — BP 148/80 | HR 70 | Temp 98.0°F | Resp 16 | Ht 66.0 in | Wt 147.0 lb

## 2014-04-14 DIAGNOSIS — Z0000 Encounter for general adult medical examination without abnormal findings: Secondary | ICD-10-CM

## 2014-04-14 DIAGNOSIS — R030 Elevated blood-pressure reading, without diagnosis of hypertension: Secondary | ICD-10-CM

## 2014-04-14 DIAGNOSIS — R490 Dysphonia: Secondary | ICD-10-CM

## 2014-04-14 DIAGNOSIS — C61 Malignant neoplasm of prostate: Secondary | ICD-10-CM | POA: Diagnosis not present

## 2014-04-14 DIAGNOSIS — Z79899 Other long term (current) drug therapy: Secondary | ICD-10-CM

## 2014-04-14 DIAGNOSIS — G459 Transient cerebral ischemic attack, unspecified: Secondary | ICD-10-CM | POA: Diagnosis not present

## 2014-04-14 DIAGNOSIS — E785 Hyperlipidemia, unspecified: Secondary | ICD-10-CM | POA: Diagnosis not present

## 2014-04-14 DIAGNOSIS — M109 Gout, unspecified: Secondary | ICD-10-CM | POA: Diagnosis not present

## 2014-04-14 DIAGNOSIS — K219 Gastro-esophageal reflux disease without esophagitis: Secondary | ICD-10-CM

## 2014-04-14 DIAGNOSIS — IMO0001 Reserved for inherently not codable concepts without codable children: Secondary | ICD-10-CM

## 2014-04-14 LAB — CBC WITH DIFFERENTIAL/PLATELET
BASOS ABS: 0 10*3/uL (ref 0.0–0.1)
BASOS PCT: 0 % (ref 0–1)
EOS PCT: 11 % — AB (ref 0–5)
Eosinophils Absolute: 0.8 10*3/uL — ABNORMAL HIGH (ref 0.0–0.7)
HEMATOCRIT: 46.2 % (ref 39.0–52.0)
HEMOGLOBIN: 16.1 g/dL (ref 13.0–17.0)
Lymphocytes Relative: 27 % (ref 12–46)
Lymphs Abs: 1.9 10*3/uL (ref 0.7–4.0)
MCH: 31.1 pg (ref 26.0–34.0)
MCHC: 34.8 g/dL (ref 30.0–36.0)
MCV: 89.4 fL (ref 78.0–100.0)
MONO ABS: 0.9 10*3/uL (ref 0.1–1.0)
MONOS PCT: 12 % (ref 3–12)
NEUTROS ABS: 3.6 10*3/uL (ref 1.7–7.7)
Neutrophils Relative %: 50 % (ref 43–77)
Platelets: 259 10*3/uL (ref 150–400)
RBC: 5.17 MIL/uL (ref 4.22–5.81)
RDW: 13.6 % (ref 11.5–15.5)
WBC: 7.1 10*3/uL (ref 4.0–10.5)

## 2014-04-14 LAB — VITAMIN B12: Vitamin B-12: 1076 pg/mL — ABNORMAL HIGH (ref 211–911)

## 2014-04-14 LAB — POCT UA - MICROSCOPIC ONLY
CRYSTALS, UR, HPF, POC: NEGATIVE
Casts, Ur, LPF, POC: NEGATIVE
EPITHELIAL CELLS, URINE PER MICROSCOPY: NEGATIVE
MUCUS UA: NEGATIVE
Yeast, UA: NEGATIVE

## 2014-04-14 LAB — POCT URINALYSIS DIPSTICK
BILIRUBIN UA: NEGATIVE
Glucose, UA: NEGATIVE
KETONES UA: NEGATIVE
Leukocytes, UA: NEGATIVE
Nitrite, UA: NEGATIVE
PH UA: 6.5
PROTEIN UA: NEGATIVE
RBC UA: NEGATIVE
SPEC GRAV UA: 1.02
Urobilinogen, UA: 0.2

## 2014-04-14 LAB — COMPLETE METABOLIC PANEL WITH GFR
ALBUMIN: 4.7 g/dL (ref 3.5–5.2)
ALK PHOS: 86 U/L (ref 39–117)
ALT: 19 U/L (ref 0–53)
AST: 22 U/L (ref 0–37)
BUN: 10 mg/dL (ref 6–23)
CALCIUM: 9.6 mg/dL (ref 8.4–10.5)
CO2: 29 mEq/L (ref 19–32)
CREATININE: 0.76 mg/dL (ref 0.50–1.35)
Chloride: 101 mEq/L (ref 96–112)
GFR, EST NON AFRICAN AMERICAN: 89 mL/min
GFR, Est African American: >89 mL/min
GLUCOSE: 86 mg/dL (ref 70–99)
POTASSIUM: 4 meq/L (ref 3.5–5.3)
Sodium: 140 mEq/L (ref 135–145)
Total Bilirubin: 0.9 mg/dL (ref 0.2–1.2)
Total Protein: 7.4 g/dL (ref 6.0–8.3)

## 2014-04-14 LAB — LIPID PANEL
CHOL/HDL RATIO: 3.1 ratio
CHOLESTEROL: 199 mg/dL (ref 0–200)
HDL: 65 mg/dL (ref >39)
LDL Cholesterol: 118 mg/dL — ABNORMAL HIGH (ref 0–99)
TRIGLYCERIDES: 81 mg/dL (ref <150)
VLDL: 16 mg/dL (ref 0–40)

## 2014-04-14 LAB — URIC ACID: Uric Acid, Serum: 4 mg/dL (ref 4.0–7.8)

## 2014-04-14 LAB — FERRITIN: Ferritin: 184 ng/mL (ref 22–322)

## 2014-04-14 LAB — IFOBT (OCCULT BLOOD): IFOBT: NEGATIVE

## 2014-04-14 MED ORDER — FLUOCINOLONE ACETONIDE 0.01 % EX SOLN: CUTANEOUS | Status: DC

## 2014-04-14 NOTE — Progress Notes (Signed)
Subjective:    Patient ID: [name], male    DOB: 1935/04/13, 79 y.o.   MRN: [id number] This chart was scribed for Arlyss Queen, MD by Terressa Koyanagi, ED Scribe. This patient was seen in room 22 and the patient's care was started at 8:27 AM.     HPI  HPI Comments: [name] is a 79 y.o. male, with an extensive medical history including Dx of colon cancer (which was removed and has been in intermission since), who presents to the [facility] for his annual physical. Pt also complains that his feet are cold when he goes to sleep at night.    GI: Pt reports he is not yet due for a colonoscopy. Pt reports he needs a new GI referral as his former GI provider, Dr. Sharlett Iles with [facility], retired. Pt reports he was very happy with [facility] and wishes to get a new referral from the same practice group.   NEURO: Pt is followed by [facility] for sleep disorders and short term memory loss.   UROLOGY: Pt reports he is followed by Dr. Diona Fanti at [facility].   MEDS: Pt reports he is on aspirin, Zyloprim, Nexium, Proscar, Remeron, Valtrex and Restasis.   Nexium: Pt reports he dislikes Nexium and wants to get off the med. Pt reports he was Dx with GERD three years ago and has been on Nexium since.   Restasis: Pt reports that he is happy with Restasis, however, is open to discussing laser correction options to help with his dry eyes. Pt states he has read some materials on the web that indicate laser correction surgery may be a viable option.   ENT: Pt cannot recall when his voice box was last checked by ENT.   SIGNIFICANT Hx: Pt is a former smoker who quit in 1964. Pt denies any use of any other tobacco product. Pt also reports drinking 3 glasses of wine per night.   PERSONAL Hx: Pt reports that he recently traveled to [facility] this past week for a family funeral.   Past Medical History  Diagnosis Date  . Barrett's esophagus   . Herpes   . Gout   .  Prostatitis   . Cataract   . Glaucoma   . Elevated PSA   . Hypertrophy of prostate without urinary obstruction and other lower urinary tract symptoms (LUTS)   . Vertigo   . Memory change 04/19/2013  . Impotence of organic origin   . Stroke     tia in  2010  . Prostate cancer   . GERD (gastroesophageal reflux disease)   . Insomnia   . HOH (hard of hearing)   . Hiatal hernia   . Diverticulosis of colon (without mention of hemorrhage)   . Esophageal stricture   . History of colon cancer   . Hypersomnia, persistent 11/24/2013    Reports Epworth of 10 and higher on CPAP with  good compliance.     Review of Systems  Constitutional: Negative for fever and chills.  Musculoskeletal:       Feet cold at night bilaterally       Objective:   Physical Exam  CONSTITUTIONAL: Well developed/well nourished HEAD: Normocephalic/atraumatic DENTAL: upper and lower dentures  EYES: EOMI/PERRL ENMT: Mucous membranes moist NECK: supple no meningeal signs SPINE:entire spine nontender CV: S1/S2 noted, no murmurs/rubs/gallops noted LUNGS: Lungs are clear to auscultation bilaterally, no apparent distress ABDOMEN: soft, nontender, no rebound or guarding. Mild increase in  size of the left lobe of the prostate which is not definitely nodular GU:no cva tenderness NEURO: Pt is awake/alert, moves all extremitiesx4 EXTREMITIES: pulses normal, full ROM SKIN: warm, color normal PSYCH: no abnormalities of mood noted Results for orders placed in visit on 04/14/14  IFOBT (OCCULT BLOOD)      Result Value Ref Range   IFOBT Negative    POCT URINALYSIS DIPSTICK      Result Value Ref Range   Color, UA yellow     Clarity, UA clear     Glucose, UA neg     Bilirubin, UA neg     Ketones, UA neg     Spec Grav, UA 1.020     Blood, UA neg     pH, UA 6.5     Protein, UA neg     Urobilinogen, UA 0.2     Nitrite, UA neg     Leukocytes, UA Negative    POCT UA - MICROSCOPIC ONLY      Result Value Ref Range    WBC, Ur, HPF, POC 0-1     RBC, urine, microscopic 0-2     Bacteria, U Microscopic 1+     Mucus, UA neg     Epithelial cells, urine per micros neg     Crystals, Ur, HPF, POC neg     Casts, Ur, LPF, POC neg     Yeast, UA neg     Triage Vitals: BP 148/80  Pulse 70  Temp(Src) 98 F (36.7 C)  Resp 16  Ht 5\' 6"  (1.676 m)  Wt 147 lb (66.679 kg)  BMI 23.74 kg/m2  SpO2 96%  Assessment & Plan:  DIAGNOSTIC STUDIES: Oxygen Saturation is 96% on RA, normal by my interpretation.    COORDINATION OF CARE: 8:46 AM-Discussed treatment plan which includes GI referral, ENT referral for voice box check, taking vitamin B supplements, and labs with pt. Patient verbalizes understanding and agrees with treatment plan. Patient looks good. Routine labs were done. I referred him back to GI regarding his chronic reflux and scheduled an ENT appointment because of his hoarseness which is probably reflux related. He is not a smoker and has not used .

## 2014-04-15 LAB — MAGNESIUM: Magnesium: 2.2 mg/dL (ref 1.5–2.5)

## 2014-04-15 LAB — VITAMIN D 25 HYDROXY (VIT D DEFICIENCY, FRACTURES): Vit D, 25-Hydroxy: 44 ng/mL (ref 30–89)

## 2014-04-15 LAB — MAGNESIUM, RBC

## 2014-04-19 ENCOUNTER — Telehealth: Payer: Self-pay | Admitting: *Deleted

## 2014-04-19 ENCOUNTER — Other Ambulatory Visit: Payer: Self-pay | Admitting: Emergency Medicine

## 2014-04-19 DIAGNOSIS — R490 Dysphonia: Secondary | ICD-10-CM

## 2014-04-19 DIAGNOSIS — K121 Other forms of stomatitis: Secondary | ICD-10-CM

## 2014-04-19 NOTE — Telephone Encounter (Signed)
FYI Pt concerned that the PSA was not ordered. He wants this test added to his lab work.  Added test per Haig Prophet to make sure we added the test before the expiration date on blood sent to lab

## 2014-04-19 NOTE — Telephone Encounter (Signed)
Just call Mr. Carlson  the test is being run. We should have results tomorrow

## 2014-04-19 NOTE — Telephone Encounter (Signed)
I called patient later now his PSA is being run. ENT referral also to be done regarding lesion in his mouth and hoarseness.

## 2014-04-20 LAB — PSA: PSA: 9.13 ng/mL — ABNORMAL HIGH (ref <=4.00)

## 2014-04-20 NOTE — Telephone Encounter (Signed)
Pt advised.

## 2014-04-21 ENCOUNTER — Encounter: Payer: Self-pay | Admitting: *Deleted

## 2014-04-27 ENCOUNTER — Ambulatory Visit (INDEPENDENT_AMBULATORY_CARE_PROVIDER_SITE_OTHER): Payer: Medicare Other | Admitting: Gastroenterology

## 2014-04-27 ENCOUNTER — Encounter: Payer: Self-pay | Admitting: Gastroenterology

## 2014-04-27 VITALS — BP 108/68 | HR 70 | Ht 66.0 in | Wt 149.8 lb

## 2014-04-27 DIAGNOSIS — K219 Gastro-esophageal reflux disease without esophagitis: Secondary | ICD-10-CM

## 2014-04-27 DIAGNOSIS — G459 Transient cerebral ischemic attack, unspecified: Secondary | ICD-10-CM | POA: Diagnosis not present

## 2014-04-27 DIAGNOSIS — Z85038 Personal history of other malignant neoplasm of large intestine: Secondary | ICD-10-CM

## 2014-04-27 NOTE — Patient Instructions (Signed)
Stay on Nexium daily.   Call Dr. Perfecto Kingdom office to get an update about your Ear, Nose and Throat referral.   Thank you for choosing me and [facility].  Pricilla Riffle. Dagoberto Ligas., MD., Marval Regal

## 2014-04-27 NOTE — Progress Notes (Signed)
    History of Present Illness: This is a 79 year old male known to Dr. Sharlett Iles for his of GERD complicated by peptic strictures requiring dilation. He underwent EGD in 03/2010 showing a HH. Patient also has a history of colon polyps including a distal sigmoid adenocarcinoma in situ in 2005. He is followed by Dr. Lucia Gaskins (surgery) who has done at least 3 surveillance lower endoscopies since 2005. Patient's last colonoscopy was in 11/2011 and it was completely normal except for diverticulosis. Patient was also evaluated by Dr. Hilarie Fredrickson in 2012. He complains of intermittent hoarseness. He also notes occasional heartburn when he does not take Nexium daily. His hoarseness does not appear correlate with his Nexium usage. He is also concerned about a lesion underneath this time that his dentist found. Dr. Everlene Farrier placed a referral to ENT for further evaluation of both his hoarseness and his tongue lesion.   Current Medications, Allergies, Past Medical History, Past Surgical History, Family History and Social History were reviewed in [employer] record.  Physical Exam: General: Well developed , well nourished, no acute distress Head: Normocephalic and atraumatic Eyes:  sclerae anicteric, EOMI Ears: Normal auditory acuity Mouth: No deformity or lesions Lungs: Clear throughout to auscultation Heart: Regular rate and rhythm; no murmurs, rubs or bruits Abdomen: Soft, non tender and non distended. No masses, hepatosplenomegaly or hernias noted. Normal Bowel sounds Musculoskeletal: Symmetrical with no gross deformities  Pulses:  Normal pulses noted Extremities: No clubbing, cyanosis, edema or deformities noted Neurological: Alert oriented x 4, grossly nonfocal Psychological:  Alert and cooperative. Normal mood and affect  Assessment and Recommendations:  1. GERD. Possible LPR. Continue all standard antireflux measures and Nexium 40 mg daily, everyday. ENT evaluation as planned.  2.  Personal history of adenocarcinoma of the colon in situ in 2005. Surveillance colonoscopy recommended January 2018.

## 2014-04-28 ENCOUNTER — Telehealth: Payer: Self-pay | Admitting: Nurse Practitioner

## 2014-04-28 NOTE — Telephone Encounter (Signed)
Noted  

## 2014-04-28 NOTE — Telephone Encounter (Signed)
Phone tree shows pt cancelled, lft msg for pt to c/b to r/s or confirm he will be at this apt time 05/02/14. Thanks

## 2014-05-02 ENCOUNTER — Ambulatory Visit: Payer: Medicare Other | Admitting: Nurse Practitioner

## 2014-05-02 ENCOUNTER — Telehealth: Payer: Self-pay | Admitting: *Deleted

## 2014-05-02 NOTE — Telephone Encounter (Signed)
Spoke with patient, he is scheduled to see NP LL, since he is a [facility] patient he should be seen by NP MM, patient wanted to r/s appointment, appointment was r/s to 05/09/14 with NP MM

## 2014-05-03 ENCOUNTER — Ambulatory Visit: Payer: Medicare Other | Admitting: Nurse Practitioner

## 2014-05-09 ENCOUNTER — Encounter: Payer: Self-pay | Admitting: Adult Health

## 2014-05-09 ENCOUNTER — Ambulatory Visit (INDEPENDENT_AMBULATORY_CARE_PROVIDER_SITE_OTHER): Payer: Medicare Other | Admitting: Adult Health

## 2014-05-09 VITALS — Wt 151.0 lb

## 2014-05-09 DIAGNOSIS — Z9989 Dependence on other enabling machines and devices: Secondary | ICD-10-CM

## 2014-05-09 DIAGNOSIS — G4733 Obstructive sleep apnea (adult) (pediatric): Secondary | ICD-10-CM

## 2014-05-09 DIAGNOSIS — G459 Transient cerebral ischemic attack, unspecified: Secondary | ICD-10-CM | POA: Diagnosis not present

## 2014-05-09 DIAGNOSIS — R413 Other amnesia: Secondary | ICD-10-CM | POA: Diagnosis not present

## 2014-05-09 NOTE — Progress Notes (Signed)
PATIENT: [name] DOB: 1935/07/18  REASON FOR VISIT: follow up HISTORY FROM: patient  HISTORY OF PRESENT ILLNESS: [name] is a 79 year old male with a history of short term memory loss. He returns today for evaluation. He currently is not on any medication for his memory. Patient reports that his memory has been "fair."  The patient is able to complete all ADLs independently. Patient lives alone. Patient denies having to give up anything due to his memory. He operates a Teacher, music without difficulty. He currently takes mirtazapine for sleep and reports that it is has been beneficial. The patient also has obstructive sleep apnea and wears CPAP with great compliance. He reports that he will not go to sleep without his CPAP. No new medical issues since last seen.  REVIEW OF SYSTEMS: Full 14 system review of systems performed and notable only for:  Constitutional: N/A  Eyes: N/A Ear/Nose/Throat: N/A  Skin: N/A  Cardiovascular: N/A  Respiratory: N/A  Gastrointestinal: N/A  Genitourinary: N/A Hematology/Lymphatic: N/A  Endocrine: N/A Musculoskeletal:N/A  Allergy/Immunology: N/A  Neurological: N/A Psychiatric: N/A Sleep: apnea   ALLERGIES: Allergies  Allergen Reactions  . Sulfamethoxazole Other (See Comments)    Constipation  . Sulfamethoxazole-Tmp Ds     constipation    HOME MEDICATIONS: Outpatient Prescriptions Prior to Visit  Medication Sig Dispense Refill  . allopurinol (ZYLOPRIM) 100 MG tablet Take 2 tablets daily  180 tablet  3  . aspirin 81 MG tablet Take 81 mg by mouth daily.        . cycloSPORINE (RESTASIS) 0.05 % ophthalmic emulsion 1 drop 2 (two) times daily. As needed.       . finasteride (PROSCAR) 5 MG tablet Take 5 mg by mouth daily.      . fluocinolone (SYNALAR) 0.01 % external solution Apply 10 drops to scalp at bedtime  60 mL  5  . mirtazapine (REMERON) 30 MG tablet Take 1 tablet (30 mg total) by mouth at bedtime.  90 tablet  3  .  valACYclovir (VALTREX) 1000 MG tablet 1/2 tab daily.  45 tablet  3  . esomeprazole (NEXIUM) 40 MG capsule Take 1 capsule (40 mg total) by mouth daily before breakfast.  90 capsule  6  . Multiple Vitamins-Minerals (MULTIVITAMIN WITH MINERALS) tablet Take 1 tablet by mouth daily.         No facility-administered medications prior to visit.    PAST MEDICAL HISTORY: Past Medical History  Diagnosis Date  . Barrett's esophagus   . Herpes   . Gout   . Prostatitis   . Cataract   . Glaucoma   . Elevated PSA   . Hypertrophy of prostate without urinary obstruction and other lower urinary tract symptoms (LUTS)   . Vertigo   . Memory change 04/19/2013  . Impotence of organic origin   . Stroke     tia in  2010  . Prostate cancer   . GERD (gastroesophageal reflux disease)   . Insomnia   . HOH (hard of hearing)   . Hiatal hernia   . Diverticulosis of colon (without mention of hemorrhage)   . Esophageal stricture   . History of colon cancer   . Hypersomnia, persistent 11/24/2013    Reports Epworth of 10 and higher on CPAP with  good compliance.   Marland Kitchen TIA (transient ischemic attack)   . Hyperlipidemia     PAST SURGICAL HISTORY: Past Surgical History  Procedure Laterality Date  . Refractive surgery    .  Intracapsular cataract extraction      Lt eye  . Eye surgery      both cataracts  . Mass excision N/A 07/29/2013    Procedure: MINOR EXCISION OF CHEST KELOID CONTRACTURE;  Surgeon: Theodoro Kos, DO;  Location: [place];  Service: Plastics;  Laterality: N/A;  ACELL placement  . Minor graft application N/A 0/76/2263    Procedure: MINOR GRAFT APPLICATION A CELL;  Surgeon: Theodoro Kos, DO;  Location: [place];  Service: Plastics;  Laterality: N/A;    FAMILY HISTORY: Family History  Problem Relation Age of Onset  . Colon cancer Neg Hx   . Cirrhosis Mother   . Jaundice Mother   . Hyperlipidemia Father   . Heart disease Father     SOCIAL  HISTORY: History   Social History  . Marital Status: Single    Spouse Name: N/A    Number of Children: 2  . Years of Education: 15   Occupational History  . Retired    Social History Main Topics  . Smoking status: Former Smoker    Quit date: 07/23/1962  . Smokeless tobacco: Never Used     Comment: Stopped in 1963  . Alcohol Use: 8.8 oz/week    14 Drinks containing 0.5 oz of alcohol, 3 Glasses of wine per week     Comment: 2-3 drinks nightly  . Drug Use: No  . Sexual Activity: Yes     Comment: 5 partners   Other Topics Concern  . Not on file   Social History Narrative   Patient is single.   Patient has two children.   Patient is retired.   Patient has a college education.   Patient is right-handed.   Patient drinks 3-4 cups of coffee daily.   Patient gets regular exercise.      PHYSICAL EXAM  Filed Vitals:   05/09/14 0910  Weight: 151 lb (68.493 kg)   Body mass index is 24.38 kg/(m^2).  Generalized: Well developed, in no acute distress   Neurological examination  Mentation: Alert oriented to time, place, history taking. Follows all commands speech and language fluent Cranial nerve II-XII: . Extraocular movements were full, visual field were full on confrontational test.  Motor: The motor testing reveals 5 over 5 strength of all 4 extremities. Good symmetric motor tone is noted throughout.  Sensory: Sensory testing is intact to soft touch on all 4 extremities. No evidence of extinction is noted.  Coordination: Cerebellar testing reveals good finger-nose-finger and heel-to-shin bilaterally.  Gait and station: Gait is normal. Tandem gait is normal. Romberg is negative. No drift is seen.  Reflexes: Deep tendon reflexes are symmetric and normal bilaterally.   DIAGNOSTIC DATA (LABS, IMAGING, TESTING) - I reviewed patient records, labs, notes, testing and imaging myself where available.  Lab Results  Component Value Date   WBC 7.1 04/14/2014   HGB 16.1 04/14/2014    HCT 46.2 04/14/2014   MCV 89.4 04/14/2014   PLT 259 04/14/2014      Component Value Date/Time   NA 140 04/14/2014 0827   K 4.0 04/14/2014 0827   CL 101 04/14/2014 0827   CO2 29 04/14/2014 0827   GLUCOSE 86 04/14/2014 0827   BUN 10 04/14/2014 0827   CREATININE 0.76 04/14/2014 0827   CREATININE 0.75 07/26/2013 1130   CALCIUM 9.6 04/14/2014 0827   PROT 7.4 04/14/2014 0827   ALBUMIN 4.7 04/14/2014 0827   AST 22 04/14/2014 0827   ALT 19 04/14/2014 0827  ALKPHOS 86 04/14/2014 0827   BILITOT 0.9 04/14/2014 0827   GFRNONAA 89 04/14/2014 0827   GFRNONAA 87* 07/26/2013 1130   GFRAA >89 04/14/2014 0827   GFRAA >90 07/26/2013 1130   Lab Results  Component Value Date   CHOL 199 04/14/2014   HDL 65 04/14/2014   LDLCALC 118* 04/14/2014   TRIG 81 04/14/2014   CHOLHDL 3.1 04/14/2014    Lab Results  Component Value Date   VITAMINB12 1076* 04/14/2014       ASSESSMENT AND PLAN 79 y.o. year old male  has a past medical history of Barrett's esophagus; Herpes; Gout; Prostatitis; Cataract; Glaucoma; Elevated PSA; Hypertrophy of prostate without urinary obstruction and other lower urinary tract symptoms (LUTS); Vertigo; Memory change (04/19/2013); Impotence of organic origin; Stroke; Prostate cancer; GERD (gastroesophageal reflux disease); Insomnia; HOH (hard of hearing); Hiatal hernia; Diverticulosis of colon (without mention of hemorrhage); Esophageal stricture; History of colon cancer; Hypersomnia, persistent (11/24/2013); TIA (transient ischemic attack); and Hyperlipidemia. here with:  1. change in memory 2. Obstructive sleep apnea on CPAP  Patient's memory has remained stable. He scored a 30/30 on the MMSE. Patient feels that his CPAP has improved his memory as well. Patient was advised that if he notices a decline in his memory he should make [place] aware. The patient is not currently on any medication for his memory, this could be considered in the future if his memory declines. I advised the patient to follow up  in 6 months. At that time we can follow up for OSA as well as memory. Patient was advised to bring his machine to this visit. I will communicate with Dr. [name] Fairy in regards to his obstructive sleep apnea follow up visit being scheduled with me.  Ward Givens, MSN, NP-C 05/09/2014, 9:20 AM [facility] [address], [place], [place] [address] [phone]  Note: This document was prepared with digital dictation and possible smart phrase technology. Any transcriptional errors that result from this process are unintentional.

## 2014-05-09 NOTE — Progress Notes (Signed)
I have read the note, and I agree with the clinical assessment and plan.  WILLIS,CHARLES KEITH   

## 2014-05-09 NOTE — Patient Instructions (Signed)
Dementia Dementia is a general term for problems with brain function. A person with dementia has memory loss and a hard time with at least one other brain function such as thinking, speaking, or problem solving. Dementia can affect social functioning, how you do your job, your mood, or your personality. The changes may be hidden for a long time. The earliest forms of this disease are usually not detected by family or friends. Dementia can be:  Irreversible.  Potentially reversible.  Partially reversible.  Progressive. This means it can get worse over time. CAUSES  Irreversible dementia causes may include:  Degeneration of brain cells (Alzheimer's disease or lewy body dementia).  Multiple small strokes (vascular dementia).  Infection (chronic meningitis or Creutzfelt-Jakob disease).  Frontotemporal dementia. This affects younger people, age 40 to 70, compared to those who have Alzheimer's disease.  Dementia associated with other disorders like Parkinson's disease, Huntington's disease, or HIV-associated dementia. Potentially or partially reversible dementia causes may include:  Medicines.  Metabolic causes such as excessive alcohol intake, vitamin B12 deficiency, or thyroid disease.  Masses or pressure in the brain such as a tumor, blood clot, or hydrocephalus. SYMPTOMS  Symptoms are often hard to detect. Family members or coworkers may not notice them early in the disease process. Different people with dementia may have different symptoms. Symptoms can include:  A hard time with memory, especially recent memory. Long-term memory may not be impaired.  Asking the same question multiple times or forgetting something someone just said.  A hard time speaking your thoughts or finding certain words.  A hard time solving problems or performing familiar tasks (such as how to use a telephone).  Sudden changes in mood.  Changes in personality, especially increasing moodiness or  mistrust.  Depression.  A hard time understanding complex ideas that were never a problem in the past. DIAGNOSIS  There are no specific tests for dementia.   Your caregiver may recommend a thorough evaluation. This is because some forms of dementia can be reversible. The evaluation will likely include a physical exam and getting a detailed history from you and a family member. The history often gives the best clues and suggestions for a diagnosis.  Memory testing may be done. A detailed brain function evaluation called neuropsychologic testing may be helpful.  Lab tests and brain imaging (such as a CT scan or MRI scan) are sometimes important.  Sometimes observation and re-evaluation over time is very helpful. TREATMENT  Treatment depends on the cause.   If the problem is a vitamin deficiency, it may be helped or cured with supplements.  For dementias such as Alzheimer's disease, medicines are available to stabilize or slow the course of the disease. There are no cures for this type of dementia.  Your caregiver can help direct you to groups, organizations, and other caregivers to help with decisions in the care of you or your loved one. HOME CARE INSTRUCTIONS The care of individuals with dementia is varied and dependent upon the progression of the dementia. The following suggestions are intended for the person living with, or caring for, the person with dementia.  Create a safe environment.  Remove the locks on bathroom doors to prevent the person from accidentally locking himself or herself in.  Use childproof latches on kitchen cabinets and any place where cleaning supplies, chemicals, or alcohol are kept.  Use childproof covers in unused electrical outlets.  Install childproof devices to keep doors and windows secured.  Remove stove knobs or install safety   knobs and an automatic shut-off on the stove.  Lower the temperature on water heaters.  Label medicines and keep them  locked up.  Secure knives, lighters, matches, power tools, and guns, and keep these items out of reach.  Keep the house free from clutter. Remove rugs or anything that might contribute to a fall.  Remove objects that might break and hurt the person.  Make sure lighting is good, both inside and outside.  Install grab rails as needed.  Use a monitoring device to alert you to falls or other needs for help.  Reduce confusion.  Keep familiar objects and people around.  Use night lights or dim lights at night.  Label items or areas.  Use reminders, notes, or directions for daily activities or tasks.  Keep a simple, consistent routine for waking, meals, bathing, dressing, and bedtime.  Create a calm, quiet environment.  Place large clocks and calendars prominently.  Display emergency numbers and home address near all telephones.  Use cues to establish different times of the day. An example is to open curtains to let the natural light in during the day.   Use effective communication.  Choose simple words and short sentences.  Use a gentle, calm tone of voice.  Be careful not to interrupt.  If the person is struggling to find a word or communicate a thought, try to provide the word or thought.  Ask one question at a time. Allow the person ample time to answer questions. Repeat the question again if the person does not respond.  Reduce nighttime restlessness.  Provide a comfortable bed.  Have a consistent nighttime routine.  Ensure a regular walking or physical activity schedule. Involve the person in daily activities as much as possible.  Limit napping during the day.  Limit caffeine.  Attend social events that stimulate rather than overwhelm the senses.  Encourage good nutrition and hydration.  Reduce distractions during meal times and snacks.  Avoid foods that are too hot or too cold.  Monitor chewing and swallowing ability.  Continue with routine vision,  hearing, dental, and medical screenings.  Only give over-the-counter or prescription medicines as directed by the caregiver.  Monitor driving abilities. Do not allow the person to drive when safe driving is no longer possible.  Register with an identification program which could provide location assistance in the event of a missing person situation. SEEK MEDICAL CARE IF:   New behavioral problems start such as moodiness, aggressiveness, or seeing things that are not there (hallucinations).  Any new problem with brain function happens. This includes problems with balance, speech, or falling a lot.  Problems with swallowing develop.  Any symptoms of other illness happen. Small changes or worsening in any aspect of brain function can be a sign that the illness is getting worse. It can also be a sign of another medical illness such as infection. Seeing a caregiver right away is important. SEEK IMMEDIATE MEDICAL CARE IF:   A fever develops.  New or worsened confusion develops.  New or worsened sleepiness develops.  Staying awake becomes hard to do. Document Released: 04/16/2001 Document Revised: 01/13/2012 Document Reviewed: 03/18/2011 ExitCare Patient Information 2015 [employer]. This information is not intended to replace advice given to you by your health care provider. Make sure you discuss any questions you have with your health care provider.  

## 2014-05-18 ENCOUNTER — Ambulatory Visit (INDEPENDENT_AMBULATORY_CARE_PROVIDER_SITE_OTHER): Payer: Medicare Other | Admitting: Emergency Medicine

## 2014-05-18 VITALS — BP 138/80 | HR 80 | Temp 97.8°F | Resp 16 | Ht 65.75 in | Wt 150.0 lb

## 2014-05-18 DIAGNOSIS — S0003XA Contusion of scalp, initial encounter: Secondary | ICD-10-CM | POA: Diagnosis not present

## 2014-05-18 DIAGNOSIS — S1093XA Contusion of unspecified part of neck, initial encounter: Secondary | ICD-10-CM

## 2014-05-18 DIAGNOSIS — S0083XA Contusion of other part of head, initial encounter: Secondary | ICD-10-CM

## 2014-05-18 DIAGNOSIS — G459 Transient cerebral ischemic attack, unspecified: Secondary | ICD-10-CM | POA: Diagnosis not present

## 2014-05-18 NOTE — Patient Instructions (Signed)
Contusion °A contusion is a deep bruise. Contusions are the result of an injury that caused bleeding under the skin. The contusion may turn blue, purple, or yellow. Minor injuries will give you a painless contusion, but more severe contusions may stay painful and swollen for a few weeks.  °CAUSES  °A contusion is usually caused by a blow, trauma, or direct force to an area of the body. °SYMPTOMS  °· Swelling and redness of the injured area. °· Bruising of the injured area. °· Tenderness and soreness of the injured area. °· Pain. °DIAGNOSIS  °The diagnosis can be made by taking a history and physical exam. An X-ray, CT scan, or MRI may be needed to determine if there were any associated injuries, such as fractures. °TREATMENT  °Specific treatment will depend on what area of the body was injured. In general, the best treatment for a contusion is resting, icing, elevating, and applying cold compresses to the injured area. Over-the-counter medicines may also be recommended for pain control. Ask your caregiver what the best treatment is for your contusion. °HOME CARE INSTRUCTIONS  °· Put ice on the injured area. °¨ Put ice in a plastic bag. °¨ Place a towel between your skin and the bag. °¨ Leave the ice on for 15-20 minutes, 3-4 times a day, or as directed by your health care provider. °· Only take over-the-counter or prescription medicines for pain, discomfort, or fever as directed by your caregiver. Your caregiver may recommend avoiding anti-inflammatory medicines (aspirin, ibuprofen, and naproxen) for 48 hours because these medicines may increase bruising. °· Rest the injured area. °· If possible, elevate the injured area to reduce swelling. °SEEK IMMEDIATE MEDICAL CARE IF:  °· You have increased bruising or swelling. °· You have pain that is getting worse. °· Your swelling or pain is not relieved with medicines. °MAKE SURE YOU:  °· Understand these instructions. °· Will watch your condition. °· Will get help right  away if you are not doing well or get worse. °Document Released: 07/31/2005 Document Revised: 10/26/2013 Document Reviewed: 08/26/2011 °ExitCare® Patient Information ©2015 [employer]. This information is not intended to replace advice given to you by your health care provider. Make sure you discuss any questions you have with your health care provider. ° °

## 2014-05-18 NOTE — Progress Notes (Signed)
[facility] [address], [place] [address] [phone]  Date:  05/18/2014   Name:  [name]   DOB:  [birth year]   MRN:  [id number]  PCP:  Jenny Reichmann, MD    Chief Complaint: Head Injury and Mouth Lesions   History of Present Illness:  MERREL CRABBE Massey is a 79 y.o. very pleasant male patient who presents with the following:  Bent over outside and when he stood up, hit his head on an outlet box.  No LOC, neuro or visual symptoms.  No bleeding or break in the skin.  No neck pain.  No numbness tingling or weakness in the extremities.  No improvement with over the counter medications or other home remedies. Denies other complaint or health concern today.   Patient Active Problem List   Diagnosis Date Noted  . OSA on CPAP 11/24/2013  . Hypersomnia, persistent 11/24/2013  . Sebaceous cyst 07/29/2013  . Sleep apnea with use of continuous positive airway pressure (CPAP) 07/22/2013  . Insomnia, persistent 07/22/2013  . Abnormal chest percussion 07/22/2013  . Memory change 04/19/2013  . Subjective vision disturbance 04/19/2013  . OSA (obstructive sleep apnea) 04/06/2013  . Other symptoms involving digestive system 11/13/2011  . Loss of weight 11/13/2011  . Anal or rectal pain 11/13/2011  . Personal history of colonic polyps 11/13/2011  . HSV 02/16/2010  . DRY EYE SYNDROME 02/16/2010  . ESOPHAGEAL STRICTURE 02/16/2010  . GERD 02/16/2010    Past Medical History  Diagnosis Date  . Barrett's esophagus   . Herpes   . Gout   . Prostatitis   . Cataract   . Glaucoma   . Elevated PSA   . Hypertrophy of prostate without urinary obstruction and other lower urinary tract symptoms (LUTS)   . Vertigo   . Memory change 04/19/2013  . Impotence of organic origin   . Stroke     tia in  2010  . Prostate cancer   . GERD (gastroesophageal reflux disease)   . Insomnia   . HOH (hard of hearing)   . Hiatal hernia   . Diverticulosis of colon (without  mention of hemorrhage)   . Esophageal stricture   . History of colon cancer   . Hypersomnia, persistent 11/24/2013    Reports Epworth of 10 and higher on CPAP with  good compliance.   Marland Kitchen TIA (transient ischemic attack)   . Hyperlipidemia     Past Surgical History  Procedure Laterality Date  . Refractive surgery    . Intracapsular cataract extraction      Lt eye  . Eye surgery      both cataracts  . Mass excision N/A 07/29/2013    Procedure: MINOR EXCISION OF CHEST KELOID CONTRACTURE;  Surgeon: Theodoro Kos, DO;  Location: [facility];  Service: Plastics;  Laterality: N/A;  ACELL placement  . Minor graft application N/A 5/46/5035    Procedure: MINOR GRAFT APPLICATION A CELL;  Surgeon: Theodoro Kos, DO;  Location: [facility];  Service: Plastics;  Laterality: N/A;    History  Substance Use Topics  . Smoking status: Former Smoker    Quit date: 07/23/1962  . Smokeless tobacco: Never Used     Comment: Stopped in 1963  . Alcohol Use: 8.8 oz/week    14 Drinks containing 0.5 oz of alcohol, 3 Glasses of wine per week     Comment: 2-3 drinks nightly    Family History  Problem Relation Age of Onset  . Colon cancer Neg Hx   . Cirrhosis Mother   . Jaundice Mother   . Hyperlipidemia Father   . Heart disease Father     Allergies  Allergen Reactions  . Sulfamethoxazole Other (See Comments)    Constipation  . Sulfamethoxazole-Tmp Ds     constipation    Medication list has been reviewed and updated.  Current Outpatient Prescriptions on File Prior to Visit  Medication Sig Dispense Refill  . allopurinol (ZYLOPRIM) 100 MG tablet Take 2 tablets daily  180 tablet  3  . aspirin 81 MG tablet Take 81 mg by mouth daily.        . cycloSPORINE (RESTASIS) 0.05 % ophthalmic emulsion 1 drop 2 (two) times daily. As needed.       Marland Kitchen esomeprazole (NEXIUM) 40 MG capsule Take 40 mg by mouth daily.      . finasteride (PROSCAR) 5 MG tablet Take 5 mg by mouth daily.       . fluocinolone (SYNALAR) 0.01 % external solution Apply 10 drops to scalp at bedtime  60 mL  5  . mirtazapine (REMERON) 30 MG tablet Take 1 tablet (30 mg total) by mouth at bedtime.  90 tablet  3  . Multiple Vitamin (MULTIVITAMIN) capsule Take 1 capsule by mouth daily.      . tadalafil (CIALIS) 20 MG tablet Take 20 mg by mouth as needed.      . valACYclovir (VALTREX) 1000 MG tablet 1/2 tab daily.  45 tablet  3   No current facility-administered medications on file prior to visit.    Review of Systems:  As per HPI, otherwise negative.    Physical Examination: Filed Vitals:   05/18/14 1828  BP: 138/80  Pulse: 80  Temp: 97.8 F (36.6 C)  Resp: 16   Filed Vitals:   05/18/14 1828  Height: 5' 5.75" (1.67 m)  Weight: 150 lb (68.04 kg)   Body mass index is 24.4 kg/(m^2). Ideal Body Weight: Weight in (lb) to have BMI = 25: 153.4   GEN: WDWN, NAD, Non-toxic, Alert & Oriented x 3 HEENT: contusion of the scalp, Normocephalic.  Ears and Nose: No external deformity. EXTR: No clubbing/cyanosis/edema NEURO: Normal gait.   PRRERLA EOMI CN 2-12 intact PSYCH: Normally interactive. Conversant. Not depressed or anxious appearing.  Calm demeanor.    Assessment and Plan: Contusion scalp  Signed,  Ellison Carwin, MD

## 2014-06-01 DIAGNOSIS — K116 Mucocele of salivary gland: Secondary | ICD-10-CM | POA: Diagnosis not present

## 2014-06-01 DIAGNOSIS — J387 Other diseases of larynx: Secondary | ICD-10-CM | POA: Diagnosis not present

## 2014-06-10 DIAGNOSIS — N419 Inflammatory disease of prostate, unspecified: Secondary | ICD-10-CM | POA: Diagnosis not present

## 2014-06-10 DIAGNOSIS — C61 Malignant neoplasm of prostate: Secondary | ICD-10-CM | POA: Diagnosis not present

## 2014-06-15 DIAGNOSIS — N400 Enlarged prostate without lower urinary tract symptoms: Secondary | ICD-10-CM | POA: Diagnosis not present

## 2014-06-15 DIAGNOSIS — C61 Malignant neoplasm of prostate: Secondary | ICD-10-CM | POA: Diagnosis not present

## 2014-06-15 DIAGNOSIS — N486 Induration penis plastica: Secondary | ICD-10-CM | POA: Diagnosis not present

## 2014-06-15 DIAGNOSIS — R972 Elevated prostate specific antigen [PSA]: Secondary | ICD-10-CM | POA: Diagnosis not present

## 2014-06-22 ENCOUNTER — Other Ambulatory Visit: Payer: Self-pay | Admitting: Emergency Medicine

## 2014-06-27 DIAGNOSIS — R498 Other voice and resonance disorders: Secondary | ICD-10-CM | POA: Diagnosis not present

## 2014-06-27 DIAGNOSIS — J387 Other diseases of larynx: Secondary | ICD-10-CM | POA: Diagnosis not present

## 2014-06-27 DIAGNOSIS — K116 Mucocele of salivary gland: Secondary | ICD-10-CM | POA: Diagnosis not present

## 2014-07-06 DIAGNOSIS — H43819 Vitreous degeneration, unspecified eye: Secondary | ICD-10-CM | POA: Diagnosis not present

## 2014-07-06 DIAGNOSIS — Z961 Presence of intraocular lens: Secondary | ICD-10-CM | POA: Diagnosis not present

## 2014-07-26 NOTE — Telephone Encounter (Signed)
Please see sleep clinic notes. CD

## 2014-07-28 DIAGNOSIS — N419 Inflammatory disease of prostate, unspecified: Secondary | ICD-10-CM | POA: Diagnosis not present

## 2014-07-28 DIAGNOSIS — K5900 Constipation, unspecified: Secondary | ICD-10-CM | POA: Diagnosis not present

## 2014-08-01 ENCOUNTER — Encounter: Payer: Self-pay | Admitting: Gastroenterology

## 2014-08-01 ENCOUNTER — Ambulatory Visit (INDEPENDENT_AMBULATORY_CARE_PROVIDER_SITE_OTHER): Payer: Medicare Other | Admitting: Gastroenterology

## 2014-08-01 VITALS — BP 110/58 | HR 96 | Ht 65.5 in | Wt 145.2 lb

## 2014-08-01 DIAGNOSIS — K219 Gastro-esophageal reflux disease without esophagitis: Secondary | ICD-10-CM

## 2014-08-01 DIAGNOSIS — K921 Melena: Secondary | ICD-10-CM

## 2014-08-01 DIAGNOSIS — K5900 Constipation, unspecified: Secondary | ICD-10-CM

## 2014-08-01 NOTE — Progress Notes (Signed)
    History of Present Illness: This is a 79 year old male who was recently diagnosed with prostatitis and he is being treated with Cipro. He notes worsening constipation and he used an enema one day with some improvement of constipation and with small amounts of rectal bleeding that day. Since that time he has had no problems with rectal bleeding. He started MiraLax daily and his constipation has significantly improved. He underwent colonoscopy by Dr. Sharlett Iles in January 2013 showing diverticulosis and internal hemorrhoids are noted on the retroflexed rectal view images.  Current Medications, Allergies, Past Medical History, Past Surgical History, Family History and Social History were reviewed in [employer] record.  Physical Exam: General: Well developed , well nourished, no acute distress Head: Normocephalic and atraumatic Eyes:  sclerae anicteric, EOMI Ears: Normal auditory acuity Mouth: No deformity or lesions Lungs: Clear throughout to auscultation Heart: Regular rate and rhythm; no murmurs, rubs or bruits Abdomen: Soft, non tender and non distended. No masses, hepatosplenomegaly or hernias noted. Normal Bowel sounds Rectal: DRE on 9/24 in Urology office showed an enlarged prostate otherwise negative Musculoskeletal: Symmetrical with no gross deformities  Pulses:  Normal pulses noted Extremities: No clubbing, cyanosis, edema or deformities noted Neurological: Alert oriented x 4, grossly nonfocal Psychological:  Alert and cooperative. Normal mood and affect  Assessment and Recommendations:  1. Constipation and one episode of hematochezia. I suspect he had self-limited bleeding from mild anorectal trauma from the enema tip or mild bleeding from his internal hemorrhoids. I've advised him to avoid using enemas. He is advised to maintain a high fiber diet with adequate daily water intake. UroMax once or twice daily long-term to control constipation. May use  Preparation H suppositories twice daily for hemorrhoidal symptoms. Followup with PCP.  2. GERD. Possible LPR. Continue all standard antireflux measures and Nexium 40 mg daily, everyday. ENT evaluation as planned.   3. Personal history of adenocarcinoma of the colon in situ in 2005. Surveillance colonoscopy recommended January 2018.

## 2014-08-01 NOTE — Patient Instructions (Signed)
Continue your Miralax mixing 17 grams in 8 oz of water daily.   Increase your fluid intake daily.   High-Fiber Diet Fiber is found in fruits, vegetables, and grains. A high-fiber diet encourages the addition of more whole grains, legumes, fruits, and vegetables in your diet. The recommended amount of fiber for adult males is 38 g per day. For adult females, it is 25 g per day. Pregnant and lactating women should get 28 g of fiber per day. If you have a digestive or bowel problem, ask your caregiver for advice before adding high-fiber foods to your diet. Eat a variety of high-fiber foods instead of only a select few type of foods.  PURPOSE  To increase stool bulk.  To make bowel movements more regular to prevent constipation.  To lower cholesterol.  To prevent overeating. WHEN IS THIS DIET USED?  It may be used if you have constipation and hemorrhoids.  It may be used if you have uncomplicated diverticulosis (intestine condition) and irritable bowel syndrome.  It may be used if you need help with weight management.  It may be used if you want to add it to your diet as a protective measure against atherosclerosis, diabetes, and cancer. SOURCES OF FIBER  Whole-grain breads and cereals.  Fruits, such as apples, oranges, bananas, berries, prunes, and pears.  Vegetables, such as green peas, carrots, sweet potatoes, beets, broccoli, cabbage, spinach, and artichokes.  Legumes, such split peas, soy, lentils.  Almonds. FIBER CONTENT IN FOODS Starches and Grains / Dietary Fiber (g)  Cheerios, 1 cup / 3 g  Corn Flakes cereal, 1 cup / 0.7 g  Rice crispy treat cereal, 1 cup / 0.3 g  Instant oatmeal (cooked),  cup / 2 g  Frosted wheat cereal, 1 cup / 5.1 g  Brown, long-grain rice (cooked), 1 cup / 3.5 g  White, long-grain rice (cooked), 1 cup / 0.6 g  Enriched macaroni (cooked), 1 cup / 2.5 g Legumes / Dietary Fiber (g)  Baked beans (canned, plain, or vegetarian),  cup /  5.2 g  Kidney beans (canned),  cup / 6.8 g  Pinto beans (cooked),  cup / 5.5 g Breads and Crackers / Dietary Fiber (g)  Plain or honey graham crackers, 2 squares / 0.7 g  Saltine crackers, 3 squares / 0.3 g  Plain, salted pretzels, 10 pieces / 1.8 g  Whole-wheat bread, 1 slice / 1.9 g  White bread, 1 slice / 0.7 g  Raisin bread, 1 slice / 1.2 g  Plain bagel, 3 oz / 2 g  Flour tortilla, 1 oz / 0.9 g  Corn tortilla, 1 small / 1.5 g  Hamburger or hotdog bun, 1 small / 0.9 g Fruits / Dietary Fiber (g)  Apple with skin, 1 medium / 4.4 g  Sweetened applesauce,  cup / 1.5 g  Banana,  medium / 1.5 g  Grapes, 10 grapes / 0.4 g  Orange, 1 small / 2.3 g  Raisin, 1.5 oz / 1.6 g  Melon, 1 cup / 1.4 g Vegetables / Dietary Fiber (g)  Green beans (canned),  cup / 1.3 g  Carrots (cooked),  cup / 2.3 g  Broccoli (cooked),  cup / 2.8 g  Peas (cooked),  cup / 4.4 g  Mashed potatoes,  cup / 1.6 g  Lettuce, 1 cup / 0.5 g  Corn (canned),  cup / 1.6 g  Tomato,  cup / 1.1 g Document Released: 10/21/2005 Document Revised: 04/21/2012 Document Reviewed: 01/23/2012 ExitCare  Patient Information 2015 [place]. This information is not intended to replace advice given to you by your health care provider. Make sure you discuss any questions you have with your health care provider.   Thank you for choosing me and [facility].  Pricilla Riffle. Dagoberto Ligas., MD., Marval Regal  cc: Arlyss Queen, MD

## 2014-09-12 DIAGNOSIS — Z23 Encounter for immunization: Secondary | ICD-10-CM | POA: Diagnosis not present

## 2014-09-20 ENCOUNTER — Other Ambulatory Visit: Payer: Self-pay | Admitting: Gastroenterology

## 2014-09-21 ENCOUNTER — Encounter: Payer: Self-pay | Admitting: Neurology

## 2014-09-27 ENCOUNTER — Encounter: Payer: Self-pay | Admitting: Neurology

## 2014-10-19 ENCOUNTER — Telehealth: Payer: Self-pay | Admitting: Family Medicine

## 2014-10-19 NOTE — Telephone Encounter (Signed)
Left a message for patent to come in and get the flu shot or call and undate the chart with the flu shot informationl

## 2014-10-20 ENCOUNTER — Other Ambulatory Visit: Payer: Self-pay | Admitting: Emergency Medicine

## 2014-11-09 ENCOUNTER — Ambulatory Visit (INDEPENDENT_AMBULATORY_CARE_PROVIDER_SITE_OTHER): Payer: PPO | Admitting: Neurology

## 2014-11-09 ENCOUNTER — Encounter: Payer: Self-pay | Admitting: Neurology

## 2014-11-09 VITALS — BP 157/81 | HR 69 | Temp 98.3°F | Resp 18 | Ht 66.0 in | Wt 150.5 lb

## 2014-11-09 DIAGNOSIS — G3184 Mild cognitive impairment, so stated: Secondary | ICD-10-CM

## 2014-11-09 DIAGNOSIS — Z9989 Dependence on other enabling machines and devices: Secondary | ICD-10-CM

## 2014-11-09 DIAGNOSIS — G4733 Obstructive sleep apnea (adult) (pediatric): Secondary | ICD-10-CM

## 2014-11-09 DIAGNOSIS — G4700 Insomnia, unspecified: Secondary | ICD-10-CM

## 2014-11-09 MED ORDER — DONEPEZIL HCL 10 MG PO TABS: 10.0000 mg | ORAL_TABLET | Freq: Every day | ORAL | Status: DC

## 2014-11-09 NOTE — Patient Instructions (Signed)
Donepezil tablets What is this medicine? DONEPEZIL (doe NEP e zil) is used to treat mild to moderate dementia caused by Alzheimer's disease. This medicine may be used for other purposes; ask your health care provider or pharmacist if you have questions. COMMON BRAND NAME(S): Aricept What should I tell my health care provider before I take this medicine? They need to know if you have any of these conditions: -asthma or other lung disease -difficulty passing urine -head injury -heart disease -history of irregular heartbeat -liver disease -seizures (convulsions) -stomach or intestinal disease, ulcers or stomach bleeding -an unusual or allergic reaction to donepezil, other medicines, foods, dyes, or preservatives -pregnant or trying to get pregnant -breast-feeding How should I use this medicine? Take this medicine by mouth with a glass of water. Follow the directions on the prescription label. You may take this medicine with or without food. Take this medicine at regular intervals. This medicine is usually taken before bedtime. Do not take it more often than directed. Continue to take your medicine even if you feel better. Do not stop taking except on your doctor's advice. If you are taking the 23 mg donepezil tablet, swallow it whole; do not cut, crush, or chew it. Talk to your pediatrician regarding the use of this medicine in children. Special care may be needed. Overdosage: If you think you have taken too much of this medicine contact a poison control center or emergency room at once. NOTE: This medicine is only for you. Do not share this medicine with others. What if I miss a dose? If you miss a dose, take it as soon as you can. If it is almost time for your next dose, take only that dose, do not take double or extra doses. What may interact with this medicine? Do not take this medicine with any of the following medications: -certain medicines for fungal infections like itraconazole,  fluconazole, posaconazole, and voriconazole -cisapride -dextromethorphan; quinidine -dofetilide -dronedarone -pimozide -quinidine -thioridazine -ziprasidone This medicine may also interact with the following medications: -antihistamines for allergy, cough and cold -atropine -bethanechol -carbamazepine -certain medicines for bladder problems like oxybutynin, tolterodine -certain medicines for Parkinson's disease like benztropine, trihexyphenidyl -certain medicines for stomach problems like dicyclomine, hyoscyamine -certain medicines for travel sickness like scopolamine -dexamethasone -ipratropium -NSAIDs, medicines for pain and inflammation, like ibuprofen or naproxen -other medicines for Alzheimer's disease -other medicines that prolong the QT interval (cause an abnormal heart rhythm) -phenobarbital -phenytoin -rifampin, rifabutin or rifapentine This list may not describe all possible interactions. Give your health care provider a list of all the medicines, herbs, non-prescription drugs, or dietary supplements you use. Also tell them if you smoke, drink alcohol, or use illegal drugs. Some items may interact with your medicine. What should I watch for while using this medicine? Visit your doctor or health care professional for regular checks on your progress. Check with your doctor or health care professional if your symptoms do not get better or if they get worse. You may get drowsy or dizzy. Do not drive, use machinery, or do anything that needs mental alertness until you know how this drug affects you. What side effects may I notice from receiving this medicine? Side effects that you should report to your doctor or health care professional as soon as possible: -allergic reactions like skin rash, itching or hives, swelling of the face, lips, or tongue -changes in vision -feeling faint or lightheaded, falls -problems with balance -slow heartbeat, or palpitations -stomach  pain -unusual bleeding or bruising,  red or purple spots on the skin -vomiting -weight loss Side effects that usually do not require medical attention (report to your doctor or health care professional if they continue or are bothersome): -diarrhea, especially when starting treatment -headache -indigestion or heartburn -loss of appetite -muscle cramps -nausea This list may not describe all possible side effects. Call your doctor for medical advice about side effects. You may report side effects to FDA at 1-800-FDA-1088. Where should I keep my medicine? Keep out of reach of children. Store at room temperature between 15 and 30 degrees C (59 and 86 degrees F). Throw away any unused medicine after the expiration date. NOTE: This sheet is a summary. It may not cover all possible information. If you have questions about this medicine, talk to your doctor, pharmacist, or health care provider.  2015, [employer]. (2014-02-07 21:44:11)

## 2014-11-09 NOTE — Progress Notes (Signed)
[facility]  Provider:  Larey Seat, M D  Referring Provider: [name], MD Primary Care Physician:  Jenny Reichmann, MD  Chief Complaint  Patient presents with  . Follow-up    Room 10       HPI:  [name] is a 80 y.o. male  Is seen here as a referral/ revisit  from Dr. Everlene Farrier for CPAP compliance.     11-09-14 [name] is also followed by Dr. [name] as his primary neurologist, by he was last evaluated on July 2015  for memory loss.  Dr. [name] prescribed 100 mg Remeron in 2014,  mirtazapine. The patient feels that this has given him sufficient and restorative sleep and may have lifted some of the fog he had felt in daytime.  He is no longer complaining about insomnia , however on the current regimen makes him a little drowsy. He has not been exercising , he  uses his CPAP now for 2 years and is highly compliant.  . Today's Mini-Mental Status Examination was 28/30 points and his MOCA 11-09-14 is 25-30 , all lost points were in the  Word recall category. Short term  Memory loss only.   . CPAP machine for a download: The patient continues on and out to set between 6 and 15 cm water the 91st percentile is 9.6 cm water the AHI is excellent at 0.9 per hour he uses the machine on average 5 hours and 32 minutes daily this was a 30 day download dated 11-09-14 with 97% compliance for days of use and 90% compliance for days of use over 4 hours duration. He does have some moderate air leaks. He uses the heated humidification and 90 day download beginning on 08-12-14 through today 11-09-14 showed an AHI of 1.2 a compliance of 96% for daily use and 92% for over 4 hours of use :5 hours and 43 minutes on average.  Again I'm very happy with the patient's compliance   He is still  watching TV when going to be ut now ses a sleep timer,  living alone, retired .  He  naps in daytime sometimes, 30-60 minutes.  Caffeine intake throughout the morning, 3 cups a day in AM.  Sometimes alcohol in PM. Not a reader , but particpates in a dancing club. Very little exercise.  He is socially not isolated, he stated.      Review of Systems: Out of a complete 14 system review, the patient complains of only the following symptoms, and all other reviewed systems are negative.  epworth 5 points and  FSS 11 points, GDS o points, MOCA 25-30. No pain , no sob, memory is main concern.    History   Social History  . Marital Status: Single    Spouse Name: N/A    Number of Children: 2  . Years of Education: 15   Occupational History  . Retired    Social History Main Topics  . Smoking status: Former Smoker    Quit date: 07/23/1962  . Smokeless tobacco: Never Used     Comment: Stopped in 1963  . Alcohol Use: 8.8 oz/week    14 Not specified, 3 Glasses of wine per week     Comment: 2-3 drinks nightly  . Drug Use: No  . Sexual Activity: Yes     Comment: 5 partners   Other Topics Concern  . Not on file   Social History Narrative   Patient  is single.   Patient has two children.   Patient is retired.   Patient has a college education.   Patient is right-handed.   Patient drinks 3-4 cups of coffee daily.   Patient gets regular exercise.    Family History  Problem Relation Age of Onset  . Colon cancer Neg Hx   . Cirrhosis Mother   . Jaundice Mother   . Hyperlipidemia Father   . Heart disease Father     Past Medical History  Diagnosis Date  . Barrett's esophagus   . Herpes   . Gout   . Prostatitis   . Cataract   . Glaucoma   . Elevated PSA   . Hypertrophy of prostate without urinary obstruction and other lower urinary tract symptoms (LUTS)   . Vertigo   . Memory change 04/19/2013  . Impotence of organic origin   . Stroke     tia in  2010  . Prostate cancer   . GERD (gastroesophageal reflux disease)   . Insomnia   . HOH (hard of hearing)   . Hiatal hernia   . Diverticulosis of colon (without mention of hemorrhage)   . Esophageal stricture     . History of colon cancer   . Hypersomnia, persistent 11/24/2013    Reports Epworth of 10 and higher on CPAP with  good compliance.   Marland Kitchen TIA (transient ischemic attack)   . Hyperlipidemia     Past Surgical History  Procedure Laterality Date  . Refractive surgery    . Intracapsular cataract extraction      Lt eye  . Eye surgery      both cataracts  . Mass excision N/A 07/29/2013    Procedure: MINOR EXCISION OF CHEST KELOID CONTRACTURE;  Surgeon: Theodoro Kos, DO;  Location: [place];  Service: Plastics;  Laterality: N/A;  ACELL placement  . Minor graft application N/A 8/65/7846    Procedure: MINOR GRAFT APPLICATION A CELL;  Surgeon: Theodoro Kos, DO;  Location: [place];  Service: Plastics;  Laterality: N/A;    Current Outpatient Prescriptions  Medication Sig Dispense Refill  . allopurinol (ZYLOPRIM) 100 MG tablet TAKE 2 TABLETS BY MOUTH ONCE DAILY 180 tablet 1  . aspirin 81 MG tablet Take 81 mg by mouth daily.      . cycloSPORINE (RESTASIS) 0.05 % ophthalmic emulsion 1 drop 2 (two) times daily. As needed.     . finasteride (PROSCAR) 5 MG tablet Take 5 mg by mouth daily.    . mirtazapine (REMERON) 30 MG tablet Take 1 tablet (30 mg total) by mouth at bedtime. 90 tablet 3  . Multiple Vitamin (MULTIVITAMIN) capsule Take 1 capsule by mouth daily.    Marland Kitchen NEXIUM 40 MG capsule TAKE 1 CAPSULE BY MOUTH DAILY BEFORE BREAKFAST. 90 capsule 6  . polyethylene glycol (MIRALAX / GLYCOLAX) packet Take 17 g by mouth daily as needed.     . valACYclovir (VALTREX) 1000 MG tablet TAKE 1/2 TABLET BY MOUTH ONCE DAILY 45 tablet 1   No current facility-administered medications for this visit.    Allergies as of 11/09/2014 - Review Complete 11/09/2014  Allergen Reaction Noted  . Sulfamethoxazole Other (See Comments) 11/24/2013  . Sulfamethoxazole-trimethoprim  08/11/2012    Vitals: BP 157/81 mmHg  Pulse 69  Temp(Src) 98.3 F (36.8 C) (Oral)  Resp 18  Ht 5\' 6"   (1.676 m)  Wt 150 lb 8 oz (68.266 kg)  BMI 24.30 kg/m2 Last Weight:  Wt Readings from Last  1 Encounters:  11/09/14 150 lb 8 oz (68.266 kg)   Last Height:   Ht Readings from Last 1 Encounters:  11/09/14 5\' 6"  (1.676 m)    Physical exam:  General: The patient is awake, alert and appears not in acute distress. The patient is well groomed. Head: Normocephalic, atraumatic. Neck is supple.  Plan:  Treatment plan and additional workup : Blurred vision  Snoring  Insomnia  Blood pressure 161/89, pulse 70, height 5' 6.25" (1.683 m), weight 144 lb (65.318 kg).   Physical Exam  General: The patient is alert and cooperative at the time of the examination.  Head: Pupils are equal, round, and reactive to light. Discs are flat bilaterally.  Neck: The neck is supple, no carotid bruits are noted.  Respiratory: The respiratory examination is clear.  Cardiovascular: The cardiovascular examination reveals a regular rate and rhythm, no obvious murmurs or rubs are noted.  Skin: Extremities are without significant edema.  Neurologic Exam  Mental status: Mini-Mental status examination done today shows a total score 26/30. The patient is able to name 11 animals in 60 seconds.  Cranial nerves: Facial symmetry is present. There is good sensation of the face to pinprick and soft touch bilaterally. The strength of the facial muscles and the muscles to head turning and shoulder shrug are normal bilaterally. Speech is well enunciated, no aphasia or dysarthria is noted. Extraocular movements are full. Visual fields are full.  Motor: The motor testing reveals 5 over 5 strength of all 4 extremities. Sensory: Sensory testing is intact to pinprick, soft touch, vibration sensation, and position sense on all 4 extremities. No evidence of extinction is noted.  Coordination: Cerebellar testing reveals good finger-nose-finger and heel-to-shin bilaterally.  Gait and station: Gait is normal. Tandem gait is normal. Romberg  is negative. No drift is seen.  Reflexes: Deep tendon reflexes are symmetric and normal bilaterally,. Toes are downgoing bilaterally.   Assessment/Plan:  1 ) OSA, no longer hypersomnia, insomnia controlled on meds. the patient endorsed 5  points on Epworth, but has very good resolution  of AHI on auto set CPAP between 6 and 15 cm ,  [facility] is DME , nasal mask works well.  Marland Kitchen  2) insomnia due to alcohol use and  Poor sleep hygiene: this has improved, he cut down on ETOH and uses a sleep timer on TV. He reports this has resolved now, partially due to treated apnea.  3) MOCA -mild cognitive impairment with amnestic moments. Dr. [name].   Advised to meet likeminded people and like minded activities. He joined a pool , [employer].   This Patient is 97%  compliant. Rv in 12 month for CPAP compliance with NP, can be in a visit combined with primary neurologist. .     Katheryne Gorr, MD

## 2014-11-10 ENCOUNTER — Ambulatory Visit (INDEPENDENT_AMBULATORY_CARE_PROVIDER_SITE_OTHER): Payer: PPO

## 2014-11-16 ENCOUNTER — Telehealth: Payer: Self-pay | Admitting: Neurology

## 2014-11-16 NOTE — Telephone Encounter (Signed)
Stop aricept , and will list as allergy/ side effect.

## 2014-11-16 NOTE — Telephone Encounter (Signed)
Pt is complaining that Aricept is "make me crazy".  And he has not used it since Saturday.  He says that it was giving him very weird dreams that causing him to remove his mask.  He also said that he was afraid to fall asleep because the medication made him feel like he might not wake up.  Advised the patient that I would send to Dr. [name] Fairy for review.

## 2014-11-17 NOTE — Telephone Encounter (Signed)
Spoke to pt and he is ok, back to baseline.   He is asking about other option??

## 2014-11-18 NOTE — Telephone Encounter (Signed)
I  listed Aricept under "allergy" , the second choice is Namenda, but that's usually for more advanced memory loss.  Let's stay off medication until next visit with Dr [name] , his primary neurologist  Or his NP,  He will get tested by [facility] and MOCA - will determine in that visit if namenda should be considered. CD

## 2014-11-18 NOTE — Telephone Encounter (Signed)
LMVM for pt to return call about medications.

## 2014-11-21 NOTE — Telephone Encounter (Signed)
I called and spoke to pt and relayed the message below.  He has appt with Dr. [name] on 05-16-15 at 0900, be here 0845.  He wrote this down.  Verbalized understanding of Dr. Edwena Felty recommendation.

## 2014-12-20 ENCOUNTER — Other Ambulatory Visit: Payer: Self-pay | Admitting: Neurology

## 2014-12-23 ENCOUNTER — Telehealth: Payer: Self-pay | Admitting: Gastroenterology

## 2014-12-23 NOTE — Telephone Encounter (Signed)
Patient states he was concerned that he already has pre-dementia and is still taking Nexium. He wants to know if he should stop taking Nexium. I told patient that it was completely his decision to take it or not but Dr. Fuller Plan would not recommend stopping this medication. There are no direct studies linking the two together. Patient states he will think about this and cal back.

## 2015-01-02 ENCOUNTER — Ambulatory Visit: Payer: PPO | Admitting: Neurology

## 2015-01-02 ENCOUNTER — Telehealth: Payer: Self-pay | Admitting: Neurology

## 2015-01-02 MED ORDER — MEMANTINE HCL ER 7 MG PO CP24: ORAL_CAPSULE | ORAL | Status: DC

## 2015-01-02 NOTE — Telephone Encounter (Signed)
I called the patient. The patient could not tolerate Aricept, we will convert him to Rouse. I will call in a prescription for this.

## 2015-01-02 NOTE — Telephone Encounter (Signed)
Pt is calling stating that he has had a reaction to medication Dr. [name] Fairy gave him and he states that she said to stop taking until he sees Dr. [name].  He states he has questions regarding this and he wants to change the medication.  He states the medication is Donetezil.(this is the spelling he gave)  Please call and advise.

## 2015-02-27 ENCOUNTER — Other Ambulatory Visit: Payer: Self-pay | Admitting: Emergency Medicine

## 2015-02-28 NOTE — Telephone Encounter (Signed)
Pt has appt on 04/18/15. OK to RF until then?

## 2015-03-13 ENCOUNTER — Ambulatory Visit (INDEPENDENT_AMBULATORY_CARE_PROVIDER_SITE_OTHER): Payer: PPO | Admitting: Internal Medicine

## 2015-03-13 VITALS — BP 120/80 | HR 88 | Temp 98.1°F | Resp 16 | Ht 65.5 in | Wt 146.0 lb

## 2015-03-13 DIAGNOSIS — T63461A Toxic effect of venom of wasps, accidental (unintentional), initial encounter: Secondary | ICD-10-CM | POA: Diagnosis not present

## 2015-03-13 NOTE — Progress Notes (Addendum)
Subjective:  This chart was scribed for David Lin, MD by Moises Blood, Medical Scribe. This patient was seen in Room 11 and the patient's care was started at 7:07 PM.     Patient ID: [name], male    DOB: [birth year], 80 y.o.   MRN: [id number]  HPI [name] is a 80 y.o. male who presents to [facility] complaining of multiple wasp or bee stings on the back, and on the right arm 2 hours ago. He reports a little blur of vision and mild itching. He denies difficulty breathing or swallowing and no swelling of tongue. He does not have pain in the affected areas.   No past history of bee hypersensitivity  He mentions his PCP prescribing him to Nexium to treat GERD. He has concerns of side effects due to having pre-dementia.   Patient Active Problem List   Diagnosis Date Noted  . OSA on CPAP 11/24/2013  . Hypersomnia, persistent 11/24/2013  . Sebaceous cyst 07/29/2013  . Sleep apnea with use of continuous positive airway pressure (CPAP) 07/22/2013  . Insomnia, persistent 07/22/2013  . Abnormal chest percussion 07/22/2013  . Memory change 04/19/2013  . Subjective vision disturbance 04/19/2013  . OSA (obstructive sleep apnea) 04/06/2013  . Other symptoms involving digestive system(787.99) 11/13/2011  . Loss of weight 11/13/2011  . Anal or rectal pain 11/13/2011  . Personal history of colonic polyps 11/13/2011  . HSV 02/16/2010  . DRY EYE SYNDROME 02/16/2010  . ESOPHAGEAL STRICTURE 02/16/2010  . GERD 02/16/2010   Prior to Admission medications   Medication Sig Start Date End Date Taking? Authorizing Provider  allopurinol (ZYLOPRIM) 100 MG tablet TAKE 2 TABLETS BY MOUTH ONCE DAILY 02/28/15  Yes [name], MD  aspirin 81 MG tablet Take 81 mg by mouth daily.     Yes Historical Provider, MD  cycloSPORINE (RESTASIS) 0.05 % ophthalmic emulsion 1 drop 2 (two) times daily. As needed.    Yes Historical Provider, MD  finasteride (PROSCAR) 5 MG tablet Take 5 mg by mouth  daily.   Yes Historical Provider, MD  mirtazapine (REMERON) 30 MG tablet TAKE 1 TABLET (30 MG TOTAL) BY MOUTH AT BEDTIME. 12/21/14  Yes Larey Seat, MD  Multiple Vitamin (MULTIVITAMIN) capsule Take 1 capsule by mouth daily.   Yes Historical Provider, MD  NEXIUM 40 MG capsule TAKE 1 CAPSULE BY MOUTH DAILY BEFORE BREAKFAST. 09/20/14  Yes Ladene Artist, MD  polyethylene glycol [facility] / Floria Raveling) packet Take 17 g by mouth daily as needed.    Yes Historical Provider, MD  valACYclovir (VALTREX) 1000 MG tablet TAKE 1/2 TABLET BY MOUTH ONCE DAILY 10/20/14  Yes [name], MD  memantine (NAMENDA XR) 7 MG CP24 24 hr capsule One capsule daily for one week, and take 2 capsules daily for one week, then take 3 capsules daily for one week, then take 4 capsules daily Patient not taking: Reported on 03/13/2015 01/02/15   Kathrynn Ducking, MD  '  Review of Systems  Constitutional: Negative for fever.  HENT: Negative for trouble swallowing.   Eyes: Negative for pain, redness and itching.  Respiratory: Negative for choking, chest tightness, shortness of breath and wheezing.   Cardiovascular: Negative for chest pain.  Musculoskeletal: Negative for joint swelling.  Skin: Negative for rash.  Neurological: Negative for headaches.       Objective:   Physical Exam  Constitutional: He is oriented to person, place, and time. He appears well-developed and well-nourished. No  distress.  HENT:  Head: Normocephalic and atraumatic.  Nose: Nose normal.  Mouth/Throat: Oropharynx is clear and moist. No oropharyngeal exudate.  Eyes: Conjunctivae and EOM are normal. Pupils are equal, round, and reactive to light.  Neck: Neck supple. No thyromegaly present.  Cardiovascular: Normal rate, regular rhythm and normal heart sounds.   Pulmonary/Chest: Effort normal and breath sounds normal. No respiratory distress. He has no wheezes.  Musculoskeletal: He exhibits no edema.  Lymphadenopathy:    He has no cervical  adenopathy.  Neurological: He is alert and oriented to person, place, and time. No cranial nerve deficit.  Skin: Skin is dry. No rash noted.  1 sting right flank; 1 sting right arm with no other signs of rash.   Psychiatric: He has a normal mood and affect. His behavior is normal.  Nursing note and vitals reviewed. BP 120/80 mmHg  Pulse 88  Temp(Src) 98.1 F (36.7 C) (Oral)  Resp 16  Ht 5' 5.5" (1.664 m)  Wt 146 lb (66.225 kg)  BMI 23.92 kg/m2  SpO2 96%         Assessment & Plan:  Wasp sting, accidental or unintentional, initial encounter  he appears stable and we are now 2 and half hours after the event   Zyrtec one now and one in the morning Topical steroids if desired  I have completed the patient encounter in its entirety as documented by the scribe, with editing by me where necessary. Gwyndolyn [facility] P. Laney Pastor, M.D.

## 2015-03-14 NOTE — Addendum Note (Signed)
Addended by: Leandrew Koyanagi on: 03/14/2015 10:14 PM   Modules accepted: Level of Service

## 2015-04-18 ENCOUNTER — Ambulatory Visit (INDEPENDENT_AMBULATORY_CARE_PROVIDER_SITE_OTHER): Payer: PPO | Admitting: Emergency Medicine

## 2015-04-18 ENCOUNTER — Encounter: Payer: Self-pay | Admitting: Emergency Medicine

## 2015-04-18 VITALS — BP 122/72 | HR 75 | Temp 98.0°F | Resp 16 | Ht 65.75 in | Wt 146.6 lb

## 2015-04-18 DIAGNOSIS — R030 Elevated blood-pressure reading, without diagnosis of hypertension: Secondary | ICD-10-CM | POA: Diagnosis not present

## 2015-04-18 DIAGNOSIS — Z79899 Other long term (current) drug therapy: Secondary | ICD-10-CM | POA: Diagnosis not present

## 2015-04-18 DIAGNOSIS — Z23 Encounter for immunization: Secondary | ICD-10-CM | POA: Diagnosis not present

## 2015-04-18 DIAGNOSIS — R413 Other amnesia: Secondary | ICD-10-CM

## 2015-04-18 DIAGNOSIS — M1000 Idiopathic gout, unspecified site: Secondary | ICD-10-CM

## 2015-04-18 DIAGNOSIS — Z0000 Encounter for general adult medical examination without abnormal findings: Secondary | ICD-10-CM | POA: Diagnosis not present

## 2015-04-18 DIAGNOSIS — IMO0001 Reserved for inherently not codable concepts without codable children: Secondary | ICD-10-CM

## 2015-04-18 LAB — CBC WITH DIFFERENTIAL/PLATELET
BASOS PCT: 1 % (ref 0–1)
Basophils Absolute: 0.1 10*3/uL (ref 0.0–0.1)
EOS ABS: 0.5 10*3/uL (ref 0.0–0.7)
Eosinophils Relative: 7 % — ABNORMAL HIGH (ref 0–5)
HCT: 47.6 % (ref 39.0–52.0)
Hemoglobin: 16.1 g/dL (ref 13.0–17.0)
LYMPHS PCT: 27 % (ref 12–46)
Lymphs Abs: 2.1 10*3/uL (ref 0.7–4.0)
MCH: 31.1 pg (ref 26.0–34.0)
MCHC: 33.8 g/dL (ref 30.0–36.0)
MCV: 91.9 fL (ref 78.0–100.0)
MONO ABS: 0.8 10*3/uL (ref 0.1–1.0)
MPV: 10.5 fL (ref 8.6–12.4)
Monocytes Relative: 11 % (ref 3–12)
Neutro Abs: 4.2 10*3/uL (ref 1.7–7.7)
Neutrophils Relative %: 54 % (ref 43–77)
Platelets: 309 10*3/uL (ref 150–400)
RBC: 5.18 MIL/uL (ref 4.22–5.81)
RDW: 13.8 % (ref 11.5–15.5)
WBC: 7.7 10*3/uL (ref 4.0–10.5)

## 2015-04-18 LAB — LIPID PANEL
Cholesterol: 205 mg/dL — ABNORMAL HIGH (ref 0–200)
HDL: 56 mg/dL (ref >=40)
LDL Cholesterol: 121 mg/dL — ABNORMAL HIGH (ref 0–99)
Total CHOL/HDL Ratio: 3.7 Ratio
Triglycerides: 139 mg/dL (ref <150)
VLDL: 28 mg/dL (ref 0–40)

## 2015-04-18 LAB — POCT URINALYSIS DIPSTICK
Bilirubin, UA: NEGATIVE
Blood, UA: NEGATIVE
GLUCOSE UA: NEGATIVE
Ketones, UA: NEGATIVE
LEUKOCYTES UA: NEGATIVE
NITRITE UA: NEGATIVE
Protein, UA: NEGATIVE
Spec Grav, UA: 1.015
Urobilinogen, UA: 0.2
pH, UA: 6

## 2015-04-18 LAB — POCT UA - MICROSCOPIC ONLY
BACTERIA, U MICROSCOPIC: NEGATIVE
EPITHELIAL CELLS, URINE PER MICROSCOPY: NEGATIVE
Mucus, UA: NEGATIVE
RBC, urine, microscopic: NEGATIVE
WBC, Ur, HPF, POC: NEGATIVE

## 2015-04-18 LAB — COMPLETE METABOLIC PANEL WITH GFR
ALT: 18 U/L (ref 0–53)
AST: 20 U/L (ref 0–37)
Albumin: 4.5 g/dL (ref 3.5–5.2)
Alkaline Phosphatase: 75 U/L (ref 39–117)
BUN: 14 mg/dL (ref 6–23)
CALCIUM: 9.8 mg/dL (ref 8.4–10.5)
CHLORIDE: 102 meq/L (ref 96–112)
CO2: 28 meq/L (ref 19–32)
Creat: 0.79 mg/dL (ref 0.50–1.35)
GFR, Est Non African American: 87 mL/min
GLUCOSE: 88 mg/dL (ref 70–99)
Potassium: 4.3 mEq/L (ref 3.5–5.3)
SODIUM: 140 meq/L (ref 135–145)
Total Bilirubin: 0.8 mg/dL (ref 0.2–1.2)
Total Protein: 7.1 g/dL (ref 6.0–8.3)

## 2015-04-18 LAB — URIC ACID: URIC ACID, SERUM: 5.3 mg/dL (ref 4.0–7.8)

## 2015-04-18 LAB — MAGNESIUM: Magnesium: 2.4 mg/dL (ref 1.5–2.5)

## 2015-04-18 LAB — TSH: TSH: 3.116 u[IU]/mL (ref 0.350–4.500)

## 2015-04-18 LAB — VITAMIN B12: Vitamin B-12: 1566 pg/mL — ABNORMAL HIGH (ref 211–911)

## 2015-04-18 LAB — FERRITIN: Ferritin: 184 ng/mL (ref 22–322)

## 2015-04-18 MED ORDER — ALLOPURINOL 100 MG PO TABS: 200.0000 mg | ORAL_TABLET | Freq: Every day | ORAL | Status: DC

## 2015-04-18 NOTE — Progress Notes (Addendum)
Subjective:  This chart was scribed for David Russian, MD by Tamsen Roers, at [facility].  This patient was seen in room 21 and the patient's care was started at 2:02 PM.    Patient ID: [name], male    DOB: [birth year], 80 y.o.   MRN: [id number] Chief Complaint  Patient presents with  . Annual Exam  . Medication Refill    Allopurinol 100 mg    HPI  HPI Comments: [name] is a 80 y.o. male who presents to the [facility] for a physical.  Patient complains of"hoarseness/sore throat" and went to the ENT who said everything in his throat was fine.  He also complains of a hardened spot on his left chest and states he has mild tenderness.  He would not like to see a dermatologist yet regarding the spot due to it "not hurting enough yet."  Patient no longer smokes (last in 1964).  He denies any issues with his depression or any loss of appetite.  He is unsure if he is up to date with his shingles vaccination.  Patient had just come back from [place] (he was there for 10 days).     Cancer screening: Patient had a biopsy done and was told that it has not spread (no significant growth).  His last biopsy was four years ago.   Doctors: Dr. Mar Daring (prostate) Dr. [name] (stroke) Dr. Andree Moro (ophthamologist) Domenick Bookbinder (GI) Patient does not have a cardiologist.     Patient Active Problem List   Diagnosis Date Noted  . OSA on CPAP 11/24/2013  . Hypersomnia, persistent 11/24/2013  . Sebaceous cyst 07/29/2013  . Sleep apnea with use of continuous positive airway pressure (CPAP) 07/22/2013  . Insomnia, persistent 07/22/2013  . Abnormal chest percussion 07/22/2013  . Memory change 04/19/2013  . Subjective vision disturbance 04/19/2013  . OSA (obstructive sleep apnea) 04/06/2013  . Other symptoms involving digestive system(787.99) 11/13/2011  . Loss of weight 11/13/2011  . Anal or rectal pain 11/13/2011  . Personal history of  colonic polyps 11/13/2011  . HSV 02/16/2010  . DRY EYE SYNDROME 02/16/2010  . ESOPHAGEAL STRICTURE 02/16/2010  . GERD 02/16/2010   Past Medical History  Diagnosis Date  . Barrett's esophagus   . Herpes   . Gout   . Prostatitis   . Cataract   . Glaucoma   . Elevated PSA   . Hypertrophy of prostate without urinary obstruction and other lower urinary tract symptoms (LUTS)   . Vertigo   . Memory change 04/19/2013  . Impotence of organic origin   . Stroke     tia in  2010  . Prostate cancer   . GERD (gastroesophageal reflux disease)   . Insomnia   . HOH (hard of hearing)   . Hiatal hernia   . Diverticulosis of colon (without mention of hemorrhage)   . Esophageal stricture   . History of colon cancer   . Hypersomnia, persistent 11/24/2013    Reports Epworth of 10 and higher on CPAP with  good compliance.   Marland Kitchen TIA (transient ischemic attack)   . Hyperlipidemia    Past Surgical History  Procedure Laterality Date  . Refractive surgery    . Intracapsular cataract extraction      Lt eye  . Eye surgery      both cataracts  . Mass excision N/A 07/29/2013    Procedure: MINOR EXCISION OF CHEST KELOID CONTRACTURE;  Surgeon: Theodoro Kos, DO;  [facility];  Service: Plastics;  Laterality: N/A;  ACELL placement  . Minor graft application N/A 02/10/1447    Procedure: MINOR GRAFT APPLICATION A CELL;  Surgeon: Theodoro Kos, DO;  Location: [place];  Service: Plastics;  Laterality: N/A;   Allergies  Allergen Reactions  . Aricept [Donepezil Hcl]     Weird dreams, made him feel crazy  . Sulfamethoxazole Other (See Comments)    Constipation  . Sulfamethoxazole-Trimethoprim     constipation   Prior to Admission medications   Medication Sig Start Date End Date Taking? Authorizing Provider  allopurinol (ZYLOPRIM) 100 MG tablet TAKE 2 TABLETS BY MOUTH ONCE DAILY 02/28/15   David Russian, MD  aspirin 81 MG tablet Take 81 mg by mouth daily.       Historical Provider, MD  cycloSPORINE (RESTASIS) 0.05 % ophthalmic emulsion 1 drop 2 (two) times daily. As needed.     Historical Provider, MD  finasteride (PROSCAR) 5 MG tablet Take 5 mg by mouth daily.    Historical Provider, MD  memantine (NAMENDA XR) 7 MG CP24 24 hr capsule One capsule daily for one week, and take 2 capsules daily for one week, then take 3 capsules daily for one week, then take 4 capsules daily Patient not taking: Reported on 03/13/2015 01/02/15   Kathrynn Ducking, MD  mirtazapine (REMERON) 30 MG tablet TAKE 1 TABLET (30 MG TOTAL) BY MOUTH AT BEDTIME. 12/21/14   Larey Seat, MD  Multiple Vitamin (MULTIVITAMIN) capsule Take 1 capsule by mouth daily.    Historical Provider, MD  NEXIUM 40 MG capsule TAKE 1 CAPSULE BY MOUTH DAILY BEFORE BREAKFAST. 09/20/14   Ladene Artist, MD  polyethylene glycol [facility] / Floria Raveling) packet Take 17 g by mouth daily as needed.     Historical Provider, MD  valACYclovir (VALTREX) 1000 MG tablet TAKE 1/2 TABLET BY MOUTH ONCE DAILY 10/20/14   David Russian, MD   History   Social History  . Marital Status: Single    Spouse Name: N/A  . Number of Children: 2  . Years of Education: 15   Occupational History  . Retired    Social History Main Topics  . Smoking status: Former Smoker    Quit date: 07/23/1962  . Smokeless tobacco: Never Used     Comment: Stopped in 1963  . Alcohol Use: 8.8 oz/week    14 Standard drinks or equivalent, 3 Glasses of wine per week     Comment: 2-3 drinks nightly  . Drug Use: No  . Sexual Activity: Yes     Comment: 5 partners   Other Topics Concern  . Not on file   Social History Narrative   Patient is single.   Patient has two children.   Patient is retired.   Patient has a college education.   Patient is right-handed.   Patient drinks 3-4 cups of coffee daily.   Patient gets regular exercise.    Current Outpatient Prescriptions on File Prior to Visit  Medication Sig Dispense Refill  . allopurinol  (ZYLOPRIM) 100 MG tablet TAKE 2 TABLETS BY MOUTH ONCE DAILY 180 tablet 0  . aspirin 81 MG tablet Take 81 mg by mouth daily.      . cycloSPORINE (RESTASIS) 0.05 % ophthalmic emulsion 1 drop 2 (two) times daily. As needed.     . finasteride (PROSCAR) 5 MG tablet Take 5 mg by mouth daily.    . mirtazapine (REMERON)  30 MG tablet TAKE 1 TABLET (30 MG TOTAL) BY MOUTH AT BEDTIME. 90 tablet 3  . Multiple Vitamin (MULTIVITAMIN) capsule Take 1 capsule by mouth daily.    . polyethylene glycol (MIRALAX / GLYCOLAX) packet Take 17 g by mouth daily as needed.     . valACYclovir (VALTREX) 1000 MG tablet TAKE 1/2 TABLET BY MOUTH ONCE DAILY 45 tablet 1  . NEXIUM 40 MG capsule TAKE 1 CAPSULE BY MOUTH DAILY BEFORE BREAKFAST. 90 capsule 6   No current facility-administered medications on file prior to visit.    Allergies  Allergen Reactions  . Aricept [Donepezil Hcl]     Weird dreams, made him feel crazy  . Sulfamethoxazole Other (See Comments)    Constipation  . Sulfamethoxazole-Trimethoprim     constipation    Review of Systems  Constitutional: Negative for fever, chills and appetite change.  HENT: Positive for sore throat.   Gastrointestinal: Negative for nausea and vomiting.  Musculoskeletal: Negative for neck pain and neck stiffness.       Objective:   Physical Exam CONSTITUTIONAL: Well developed/well nourished HEAD: Normocephalic/atraumatic EYES: EOMI/PERRL ENMT: Mucous membranes moist, upper and lower dentures NECK: supple no meningeal signs SPINE/BACK:entire spine nontender CV: S1/S2 noted, no murmurs/rubs/gallops noted, No bruits.  LUNGS: Lungs are clear to auscultation bilaterally, no apparent distress ABDOMEN: soft, nontender, no rebound or guarding, bowel sounds noted throughout abdomen GU:no cva tenderness NEURO: Pt is awake/alert/appropriate, moves all extremitiesx4.  No facial droop.   EXTREMITIES: pulses normal/equal, full ROM SKIN: warm, color normal, There is a 1 by 1.5 cm  cystic area beneath the left nipple. There is a linear keloid over the anterior chest which is approximately 14 inches long.  PSYCH: no abnormalities of mood noted, alert and oriented to situation     Filed Vitals:   04/18/15 1352  BP: 122/72  Pulse: 75  Temp: 98 F (36.7 C)  TempSrc: Oral  Resp: 16  Height: 5' 5.75" (1.67 m)  Weight: 146 lb 9.6 oz (66.497 kg)  SpO2: 96%   Results for orders placed or performed in visit on 04/18/15  POCT urinalysis dipstick  Result Value Ref Range   Color, UA Light Amber    Clarity, UA Clear    Glucose, UA negative    Bilirubin, UA negative    Ketones, UA Negative    Spec Grav, UA 1.015    Blood, UA Negative    pH, UA 6.0    Protein, UA negative    Urobilinogen, UA 0.2    Nitrite, UA Negative    Leukocytes, UA Negative Negative  POCT UA - Microscopic Only  Result Value Ref Range   WBC, Ur, HPF, POC Negative    RBC, urine, microscopic Negative    Bacteria, U Microscopic Negative    Mucus, UA Negative    Epithelial cells, urine per micros Negative    Crystals, Ur, HPF, POC     Casts, Ur, LPF, POC     Yeast, UA           Assessment & Plan:  1. Routine general medical examination at a health care facility  - POC Hemoccult Bld/Stl (3-Cd Home Screen); Future - HIV antibody - Hepatitis C antibody  2. Elevated BP Blood pressure at goal - POCT urinalysis dipstick - POCT UA - Microscopic Only  3. Idiopathic gout, unspecified chronicity, unspecified site No recent flareup - Uric acid - allopurinol (ZYLOPRIM) 100 MG tablet; Take 2 tablets (200 mg total) by mouth daily.  Dispense: 180 tablet; Refill: 3  4. Use of proton pump inhibitor therapy Questions answered regarding chronic use of Nexium. Screening tests done - CBC with Differential/Platelet - Ferritin - Vitamin B12 - Vit D  25 hydroxy (rtn osteoporosis monitoring) - Magnesium  5. Memory difficulty He sees the neurologist regularly he did not tolerate Aricept. He is  on treatment for sleep apnea - Lipid panel - COMPLETE METABOLIC PANEL WITH GFR - TSH  6. Need for prophylactic vaccination against Streptococcus pneumoniae (pneumococcus)  - Pneumococcal conjugate vaccine 13-valent IM   I personally performed the services described in this documentation, which was scribed in my presence. The recorded information has been reviewed and is accurate.  Arlyss Queen, MD  [facility]  04/18/2015 2:44 PM

## 2015-04-18 NOTE — Patient Instructions (Signed)

## 2015-04-19 LAB — HEPATITIS C ANTIBODY: HCV Ab: NEGATIVE

## 2015-04-19 LAB — HIV ANTIBODY (ROUTINE TESTING W REFLEX): HIV 1&2 Ab, 4th Generation: NONREACTIVE

## 2015-04-19 LAB — VITAMIN D 25 HYDROXY (VIT D DEFICIENCY, FRACTURES): Vit D, 25-Hydroxy: 25 ng/mL — ABNORMAL LOW (ref 30–100)

## 2015-04-20 LAB — POC HEMOCCULT BLD/STL (HOME/3-CARD/SCREEN)
Card #3 Fecal Occult Blood, POC: NEGATIVE
FECAL OCCULT BLD: NEGATIVE
Fecal Occult Blood, POC: NEGATIVE

## 2015-04-20 NOTE — Addendum Note (Signed)
Addended by: Yvette Rack on: 04/20/2015 09:09 AM   Modules accepted: Orders

## 2015-05-16 ENCOUNTER — Encounter: Payer: Self-pay | Admitting: Neurology

## 2015-05-16 ENCOUNTER — Ambulatory Visit (INDEPENDENT_AMBULATORY_CARE_PROVIDER_SITE_OTHER): Payer: PPO | Admitting: Neurology

## 2015-05-16 VITALS — BP 137/85 | HR 65 | Ht 65.0 in | Wt 146.6 lb

## 2015-05-16 DIAGNOSIS — R413 Other amnesia: Secondary | ICD-10-CM | POA: Diagnosis not present

## 2015-05-16 MED ORDER — MIRTAZAPINE 45 MG PO TABS: 45.0000 mg | ORAL_TABLET | Freq: Every day | ORAL | Status: DC

## 2015-05-16 MED ORDER — DONEPEZIL HCL 10 MG PO TABS: 10.0000 mg | ORAL_TABLET | Freq: Every day | ORAL | Status: DC

## 2015-05-16 NOTE — Patient Instructions (Signed)
Insomnia Insomnia is frequent trouble falling and/or staying asleep. Insomnia can be a long term problem or a short term problem. Both are common. Insomnia can be a short term problem when the wakefulness is related to a certain stress or worry. Long term insomnia is often related to ongoing stress during waking hours and/or poor sleeping habits. Overtime, sleep deprivation itself can make the problem worse. Every little thing feels more severe because you are overtired and your ability to cope is decreased. CAUSES   Stress, anxiety, and depression.  Poor sleeping habits.  Distractions such as TV in the bedroom.  Naps close to bedtime.  Engaging in emotionally charged conversations before bed.  Technical reading before sleep.  Alcohol and other sedatives. They may make the problem worse. They can hurt normal sleep patterns and normal dream activity.  Stimulants such as caffeine for several hours prior to bedtime.  Pain syndromes and shortness of breath can cause insomnia.  Exercise late at night.  Changing time zones may cause sleeping problems (jet lag). It is sometimes helpful to have someone observe your sleeping patterns. They should look for periods of not breathing during the night (sleep apnea). They should also look to see how long those periods last. If you live alone or observers are uncertain, you can also be observed at a sleep clinic where your sleep patterns will be professionally monitored. Sleep apnea requires a checkup and treatment. Give your caregivers your medical history. Give your caregivers observations your family has made about your sleep.  SYMPTOMS   Not feeling rested in the morning.  Anxiety and restlessness at bedtime.  Difficulty falling and staying asleep. TREATMENT   Your caregiver may prescribe treatment for an underlying medical disorders. Your caregiver can give advice or help if you are using alcohol or other drugs for self-medication. Treatment  of underlying problems will usually eliminate insomnia problems.  Medications can be prescribed for short time use. They are generally not recommended for lengthy use.  Over-the-counter sleep medicines are not recommended for lengthy use. They can be habit forming.  You can promote easier sleeping by making lifestyle changes such as:  Using relaxation techniques that help with breathing and reduce muscle tension.  Exercising earlier in the day.  Changing your diet and the time of your last meal. No night time snacks.  Establish a regular time to go to bed.  Counseling can help with stressful problems and worry.  Soothing music and white noise may be helpful if there are background noises you cannot remove.  Stop tedious detailed work at least one hour before bedtime. HOME CARE INSTRUCTIONS   Keep a diary. Inform your caregiver about your progress. This includes any medication side effects. See your caregiver regularly. Take note of:  Times when you are asleep.  Times when you are awake during the night.  The quality of your sleep.  How you feel the next day. This information will help your caregiver care for you.  Get out of bed if you are still awake after 15 minutes. Read or do some quiet activity. Keep the lights down. Wait until you feel sleepy and go back to bed.  Keep regular sleeping and waking hours. Avoid naps.  Exercise regularly.  Avoid distractions at bedtime. Distractions include watching television or engaging in any intense or detailed activity like attempting to balance the household checkbook.  Develop a bedtime ritual. Keep a familiar routine of bathing, brushing your teeth, climbing into bed at the same   time each night, listening to soothing music. Routines increase the success of falling to sleep faster.  Use relaxation techniques. This can be using breathing and muscle tension release routines. It can also include visualizing peaceful scenes. You can  also help control troubling or intruding thoughts by keeping your mind occupied with boring or repetitive thoughts like the old concept of counting sheep. You can make it more creative like imagining planting one beautiful flower after another in your backyard garden.  During your day, work to eliminate stress. When this is not possible use some of the previous suggestions to help reduce the anxiety that accompanies stressful situations. MAKE SURE YOU:   Understand these instructions.  Will watch your condition.  Will get help right away if you are not doing well or get worse. Document Released: 10/18/2000 Document Revised: 01/13/2012 Document Reviewed: 11/18/2007 ExitCare Patient Information 2015 [employer]. This information is not intended to replace advice given to you by your health care provider. Make sure you discuss any questions you have with your health care provider.  

## 2015-05-16 NOTE — Progress Notes (Signed)
Reason for visit: Memory disturbance  [name] is an 80 y.o. male  History of present illness:  [name] is a 80 year old right-handed white male with a history of a mild memory disturbance. The patient initially felt he could not tolerate Aricept, but he was tried on this medication again, with good tolerance. The patient is not clear whether he is on the 5 mg or 10 mg dose of the Aricept. He is having some issues with insomnia that he believes is not really controlled on Remeron 30 mg at night. He will sleep about 2 hours a night, then he wakes up and has difficulty getting back to sleep. The patient does not believe that there has been significant progression in his memory since last seen about 6 months ago. The patient is able to perform all of his activities of daily living, he mainly has problems recalling names for people and places. He returns for an evaluation.  Past Medical History  Diagnosis Date  . Barrett's esophagus   . Herpes   . Gout   . Prostatitis   . Cataract   . Glaucoma   . Elevated PSA   . Hypertrophy of prostate without urinary obstruction and other lower urinary tract symptoms (LUTS)   . Vertigo   . Memory change 04/19/2013  . Impotence of organic origin   . Stroke     tia in  2010  . Prostate cancer   . GERD (gastroesophageal reflux disease)   . Insomnia   . HOH (hard of hearing)   . Hiatal hernia   . Diverticulosis of colon (without mention of hemorrhage)   . Esophageal stricture   . History of colon cancer   . Hypersomnia, persistent 11/24/2013    Reports Epworth of 10 and higher on CPAP with  good compliance.   Marland Kitchen TIA (transient ischemic attack)   . Hyperlipidemia     Past Surgical History  Procedure Laterality Date  . Refractive surgery    . Intracapsular cataract extraction      Lt eye  . Eye surgery      both cataracts  . Mass excision N/A 07/29/2013    Procedure: MINOR EXCISION OF CHEST KELOID CONTRACTURE;  Surgeon: Theodoro Kos, DO;  Location: [facility];  Service: Plastics;  Laterality: N/A;  ACELL placement  . Minor graft application N/A 6/57/8469    Procedure: MINOR GRAFT APPLICATION A CELL;  Surgeon: Theodoro Kos, DO;  Location: [facility];  Service: Plastics;  Laterality: N/A;    Family History  Problem Relation Age of Onset  . Colon cancer Neg Hx   . Cirrhosis Mother   . Jaundice Mother   . Hyperlipidemia Father   . Heart disease Father     Social history:  reports that he quit smoking about 52 years ago. He has never used smokeless tobacco. He reports that he drinks about 8.8 oz of alcohol per week. He reports that he does not use illicit drugs.    Allergies  Allergen Reactions  . Sulfamethoxazole Other (See Comments)    Constipation  . Sulfamethoxazole-Trimethoprim     constipation    Medications:  Prior to Admission medications   Medication Sig Start Date End Date Taking? Authorizing Provider  allopurinol (ZYLOPRIM) 100 MG tablet Take 2 tablets (200 mg total) by mouth daily. 04/18/15  Yes [name], MD  aspirin 81 MG tablet Take 81 mg by mouth daily.  Yes Historical Provider, MD  cycloSPORINE (RESTASIS) 0.05 % ophthalmic emulsion 1 drop 2 (two) times daily. As needed.    Yes Historical Provider, MD  Donepezil HCl (ARICEPT PO) Take by mouth.   Yes Historical Provider, MD  finasteride (PROSCAR) 5 MG tablet Take 5 mg by mouth daily.   Yes Historical Provider, MD  mirtazapine (REMERON) 30 MG tablet TAKE 1 TABLET (30 MG TOTAL) BY MOUTH AT BEDTIME. 12/21/14  Yes Larey Seat, MD  Multiple Vitamin (MULTIVITAMIN) capsule Take 1 capsule by mouth daily.   Yes Historical Provider, MD  NEXIUM 40 MG capsule TAKE 1 CAPSULE BY MOUTH DAILY BEFORE BREAKFAST. 09/20/14  Yes Ladene Artist, MD  polyethylene glycol [facility] / Floria Raveling) packet Take 17 g by mouth daily as needed.    Yes Historical Provider, MD  valACYclovir (VALTREX) 1000 MG tablet TAKE 1/2 TABLET BY  MOUTH ONCE DAILY 10/20/14  Yes [name], MD    ROS:  Out of a complete 14 system review of symptoms, the patient complains only of the following symptoms, and all other reviewed systems are negative.  Memory loss Frequent waking  Blood pressure 137/85, pulse 65, height 5\' 5"  (1.651 m), weight 146 lb 9.6 oz (66.497 kg).  Physical Exam  General: The patient is alert and cooperative at the time of the examination.  Skin: No significant peripheral edema is noted.   Neurologic Exam  Mental status: The patient is alert and oriented x 3 at the time of the examination. The patient has apparent normal recent and remote memory, with an apparently normal attention span and concentration ability. Mini-Mental Status Examination done today shows a total score of 30/30. The patient is able to name 9 animals in 30 seconds.   Cranial nerves: Facial symmetry is present. Speech is normal, no aphasia or dysarthria is noted. Extraocular movements are full. Visual fields are full.  Motor: The patient has good strength in all 4 extremities.  Sensory examination: Soft touch sensation is symmetric on the face, arms, and legs.  Coordination: The patient has good finger-nose-finger and heel-to-shin bilaterally.  Gait and station: The patient has a normal gait. Tandem gait is slightly unsteady. Romberg is negative. No drift is seen.  Reflexes: Deep tendon reflexes are symmetric.   Assessment/Plan:  1. Mild memory disturbance  2. Insomnia, chronic  The patient has had ongoing issues with insomnia, he is not getting adequate benefit from the current dose of Remeron. We will go up to the 45 mg dosing of this medication. The patient will be continued on Aricept, the dose will be increased to 10 mg at night. A prescription was given for this medication. He will follow-up in 6 months, sooner if needed.  Jill Alexanders MD 05/16/2015 6:57 PM  [facility] [address] [place], [place] [address]  Phone [phone] Fax [phone]

## 2015-08-08 ENCOUNTER — Encounter: Payer: Self-pay | Admitting: Emergency Medicine

## 2015-09-15 ENCOUNTER — Ambulatory Visit (INDEPENDENT_AMBULATORY_CARE_PROVIDER_SITE_OTHER): Payer: PPO | Admitting: Family Medicine

## 2015-09-15 DIAGNOSIS — Z23 Encounter for immunization: Secondary | ICD-10-CM

## 2015-11-20 ENCOUNTER — Encounter: Payer: Self-pay | Admitting: Neurology

## 2015-11-20 ENCOUNTER — Ambulatory Visit (INDEPENDENT_AMBULATORY_CARE_PROVIDER_SITE_OTHER): Payer: PPO | Admitting: Neurology

## 2015-11-20 VITALS — BP 145/84 | HR 68 | Ht 66.0 in | Wt 149.0 lb

## 2015-11-20 DIAGNOSIS — G4700 Insomnia, unspecified: Secondary | ICD-10-CM | POA: Diagnosis not present

## 2015-11-20 DIAGNOSIS — R413 Other amnesia: Secondary | ICD-10-CM

## 2015-11-20 NOTE — Progress Notes (Signed)
Reason for visit: Memory disorder  [name] is an 81 y.o. male  History of present illness:  [name] is a 81 year old right-handed white male with a history of a memory disorder. The patient is on CPAP for obstructive sleep apnea. He also has reported some problems with chronic insomnia. He has been placed on Remeron taking 45 mg at night, this dose increase has helped his sleeping. He is still somewhat drowsy during the day, however. The patient reports that in the last 2 weeks he has had some difficulty with balance. Prior CT and MRI brain evaluation has shown some evidence of chronic small vessel disease. The patient denies any numbness or burning sensations in the feet. He has not developed any new weakness of extremities. The patient returns this office for an evaluation. He is on Aricept, tolerating the medication well.  Past Medical History  Diagnosis Date  . Barrett's esophagus   . Herpes   . Gout   . Prostatitis   . Cataract   . Glaucoma   . Elevated PSA   . Hypertrophy of prostate without urinary obstruction and other lower urinary tract symptoms (LUTS)   . Vertigo   . Memory change 04/19/2013  . Impotence of organic origin   . Stroke ([place])     tia in  2010  . Prostate cancer ([place])   . GERD (gastroesophageal reflux disease)   . Insomnia   . HOH (hard of hearing)   . Hiatal hernia   . Diverticulosis of colon (without mention of hemorrhage)   . Esophageal stricture   . History of colon cancer   . Hypersomnia, persistent 11/24/2013    Reports Epworth of 10 and higher on CPAP with  good compliance.   Marland Kitchen TIA (transient ischemic attack)   . Hyperlipidemia     Past Surgical History  Procedure Laterality Date  . Refractive surgery    . Intracapsular cataract extraction      Lt eye  . Eye surgery      both cataracts  . Mass excision N/A 07/29/2013    Procedure: MINOR EXCISION OF CHEST KELOID CONTRACTURE;  Surgeon: Theodoro Kos, DO;  Location: [place];  Service: Plastics;  Laterality: N/A;  ACELL placement  . Minor graft application N/A [id number]    Procedure: MINOR GRAFT APPLICATION A CELL;  Surgeon: Theodoro Kos, DO;  Location: [place];  Service: Plastics;  Laterality: N/A;    Family History  Problem Relation Age of Onset  . Colon cancer Neg Hx   . Cirrhosis Mother   . Jaundice Mother   . Hyperlipidemia Father   . Heart disease Father     Social history:  reports that he quit smoking about 53 years ago. He has never used smokeless tobacco. He reports that he drinks about 8.4 oz of alcohol per week. He reports that he does not use illicit drugs.    Allergies  Allergen Reactions  . Sulfamethoxazole Other (See Comments)    Constipation  . Sulfamethoxazole-Trimethoprim     constipation    Medications:  Prior to Admission medications   Medication Sig Start Date End Date Taking? Authorizing Provider  allopurinol (ZYLOPRIM) 100 MG tablet Take 2 tablets (200 mg total) by mouth daily. 04/18/15  Yes [name], MD  aspirin 81 MG tablet Take 81 mg by mouth daily.     Yes Historical Provider, MD  cycloSPORINE (RESTASIS) 0.05 % ophthalmic emulsion 1 drop 2 (  two) times daily. As needed.    Yes Historical Provider, MD  donepezil (ARICEPT) 10 MG tablet Take 1 tablet (10 mg total) by mouth at bedtime. 05/16/15  Yes Kathrynn Ducking, MD  finasteride (PROSCAR) 5 MG tablet Take 5 mg by mouth daily.   Yes Historical Provider, MD  mirtazapine (REMERON) 45 MG tablet Take 1 tablet (45 mg total) by mouth at bedtime. 05/16/15  Yes Kathrynn Ducking, MD  Multiple Vitamin (MULTIVITAMIN) capsule Take 1 capsule by mouth daily.   Yes Historical Provider, MD  NEXIUM 40 MG capsule TAKE 1 CAPSULE BY MOUTH DAILY BEFORE BREAKFAST. 09/20/14  Yes Ladene Artist, MD  polyethylene glycol [facility] / Floria Raveling) packet Take 17 g by mouth daily as needed.    Yes Historical Provider, MD  valACYclovir (VALTREX) 1000 MG tablet TAKE 1/2  TABLET BY MOUTH ONCE DAILY 10/20/14  Yes [name], MD    ROS:  Out of a complete 14 system review of symptoms, the patient complains only of the following symptoms, and all other reviewed systems are negative.  Blurred vision Sleep apnea  Blood pressure 145/84, pulse 68, height 5\' 6"  (1.676 m), weight 149 lb (67.586 kg).  Physical Exam  General: The patient is alert and cooperative at the time of the examination.  Skin: No significant peripheral edema is noted.   Neurologic Exam  Mental status: The patient is alert and oriented x 3 at the time of the examination. The patient has apparent normal recent and remote memory, with an apparently normal attention span and concentration ability. Mini-Mental Status Examination done today shows a total score 28/30. The patient is able to name 12 animals in 30 seconds.   Cranial nerves: Facial symmetry is present. Speech is normal, no aphasia or dysarthria is noted. Extraocular movements are full. Visual fields are full.  Motor: The patient has good strength in all 4 extremities.  Sensory examination: Soft touch sensation is symmetric on the face, arms, and legs.  Coordination: The patient has good finger-nose-finger and heel-to-shin bilaterally.  Gait and station: The patient has a normal gait. Tandem gait is unsteady. Romberg is negative, but is unsteady. No drift is seen.  Reflexes: Deep tendon reflexes are symmetric.   Assessment/Plan:  1. Memory disorder  2. 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 He gains benefit from the Remeron for sleep, we will continue 45 mg at night. The insomnia issue is better. Within the last couple weeks, he has developed some mild problems with balance. We will need to follow this over time, if this worsens, a repeat MRI of the brain may need to be done. Otherwise, he will follow-up in 6 months. The patient has been noted to have an  unsteady tandem gait on prior examinations.  Jill Alexanders MD 11/20/2015 8:11 PM  [facility] [address] [place], [place] [address]  Phone [phone] Fax [phone]

## 2015-11-20 NOTE — Patient Instructions (Signed)
Fall Prevention in the Home  Falls can cause injuries and can affect people from all age groups. There are many simple things that you can do to make your home safe and to help prevent falls. WHAT CAN I DO ON THE OUTSIDE OF MY HOME?  Regularly repair the edges of walkways and driveways and fix any cracks.  Remove high doorway thresholds.  Trim any shrubbery on the main path into your home.  Use bright outdoor lighting.  Clear walkways of debris and clutter, including tools and rocks.  Regularly check that handrails are securely fastened and in good repair. Both sides of any steps should have handrails.  Install guardrails along the edges of any raised decks or porches.  Have leaves, snow, and ice cleared regularly.  Use sand or salt on walkways during winter months.  In the garage, clean up any spills right away, including grease or oil spills. WHAT CAN I DO IN THE BATHROOM?  Use night lights.  Install grab bars by the toilet and in the tub and shower. Do not use towel bars as grab bars.  Use non-skid mats or decals on the floor of the tub or shower.  If you need to sit down while you are in the shower, use a plastic, non-slip stool..  Keep the floor dry. Immediately clean up any water that spills on the floor.  Remove soap buildup in the tub or shower on a regular basis.  Attach bath mats securely with double-sided non-slip rug tape.  Remove throw rugs and other tripping hazards from the floor. WHAT CAN I DO IN THE BEDROOM?  Use night lights.  Make sure that a bedside light is easy to reach.  Do not use oversized bedding that drapes onto the floor.  Have a firm chair that has side arms to use for getting dressed.  Remove throw rugs and other tripping hazards from the floor. WHAT CAN I DO IN THE KITCHEN?   Clean up any spills right away.  Avoid walking on wet floors.  Place frequently used items in easy-to-reach places.  If you need to reach for something  above you, use a sturdy step stool that has a grab bar.  Keep electrical cables out of the way.  Do not use floor polish or wax that makes floors slippery. If you have to use wax, make sure that it is non-skid floor wax.  Remove throw rugs and other tripping hazards from the floor. WHAT CAN I DO IN THE STAIRWAYS?  Do not leave any items on the stairs.  Make sure that there are handrails on both sides of the stairs. Fix handrails that are broken or loose. Make sure that handrails are as long as the stairways.  Check any carpeting to make sure that it is firmly attached to the stairs. Fix any carpet that is loose or worn.  Avoid having throw rugs at the top or bottom of stairways, or secure the rugs with carpet tape to prevent them from moving.  Make sure that you have a light switch at the top of the stairs and the bottom of the stairs. If you do not have them, have them installed. WHAT ARE SOME OTHER FALL PREVENTION TIPS?  Wear closed-toe shoes that fit well and support your feet. Wear shoes that have rubber soles or low heels.  When you use a stepladder, make sure that it is completely opened and that the sides are firmly locked. Have someone hold the ladder while you   are using it. Do not climb a closed stepladder.  Add color or contrast paint or tape to grab bars and handrails in your home. Place contrasting color strips on the first and last steps.  Use mobility aids as needed, such as canes, walkers, scooters, and crutches.  Turn on lights if it is dark. Replace any light bulbs that burn out.  Set up furniture so that there are clear paths. Keep the furniture in the same spot.  Fix any uneven floor surfaces.  Choose a carpet design that does not hide the edge of steps of a stairway.  Be aware of any and all pets.  Review your medicines with your healthcare provider. Some medicines can cause dizziness or changes in blood pressure, which increase your risk of falling. Talk  with your health care provider about other ways that you can decrease your risk of falls. This may include working with a physical therapist or trainer to improve your strength, balance, and endurance.   This information is not intended to replace advice given to you by your health care provider. Make sure you discuss any questions you have with your health care provider.   Document Released: 10/11/2002 Document Revised: 03/07/2015 Document Reviewed: 11/25/2014 Elsevier Interactive Patient Education 2016 [employer]vier Inc[employer]  

## 2015-11-21 DIAGNOSIS — L910 Hypertrophic scar: Secondary | ICD-10-CM | POA: Diagnosis not present

## 2015-11-27 ENCOUNTER — Telehealth: Payer: Self-pay

## 2015-11-27 NOTE — Telephone Encounter (Signed)
Patient called to request a referral to ENT for Dr Janace Hoard for balance issues.  He was last referred to their office in 2015, but they need a new referral in order to see him again.  Please advise, thank you.  CB#: [phone]

## 2015-11-28 ENCOUNTER — Other Ambulatory Visit: Payer: Self-pay | Admitting: Emergency Medicine

## 2015-11-28 DIAGNOSIS — R2689 Other abnormalities of gait and mobility: Secondary | ICD-10-CM

## 2015-11-28 NOTE — Telephone Encounter (Signed)
Can we send in referral?

## 2015-11-28 NOTE — Telephone Encounter (Signed)
I put in the referral

## 2015-11-29 NOTE — Telephone Encounter (Signed)
Spoke with pt, advised referral in.

## 2015-12-10 ENCOUNTER — Ambulatory Visit (INDEPENDENT_AMBULATORY_CARE_PROVIDER_SITE_OTHER): Payer: PPO | Admitting: Emergency Medicine

## 2015-12-10 VITALS — BP 132/64 | HR 66 | Temp 97.7°F | Resp 16 | Ht 66.25 in | Wt 150.0 lb

## 2015-12-10 DIAGNOSIS — H539 Unspecified visual disturbance: Secondary | ICD-10-CM | POA: Diagnosis not present

## 2015-12-10 DIAGNOSIS — R269 Unspecified abnormalities of gait and mobility: Secondary | ICD-10-CM | POA: Diagnosis not present

## 2015-12-10 DIAGNOSIS — R413 Other amnesia: Secondary | ICD-10-CM

## 2015-12-10 NOTE — Patient Instructions (Signed)
I have scheduled you a carotid study. I have scheduled you a repeat MRI as suggested by Dr. [name]. Please continue baby aspirin daily.

## 2015-12-10 NOTE — Progress Notes (Signed)
Patient ID: [name], male   DOB: Apr 03, 1935, 81 y.o.   MRN: [id number]     By signing my name below, I, Zola Button, attest that this documentation has been prepared under the direction and in the presence of Arlyss Queen, MD.  Electronically Signed: Zola Button, Medical Scribe. 12/10/2015. 3:29 PM.   Chief Complaint:  Chief Complaint  Patient presents with  . Neck Pain    left side (palpation)    HPI: [name] is a 81 y.o. male with a history of vertigo and TIA who reports to [facility] today complaining of a pulsating sensation to his left carotid artery that started a few weeks ago. He also reports having numbness in his fingers bilaterally when sleeping and problems with his gait. Patient denies weakness. He sees his neurologist, Dr. [name], yearly. His last US Carotid Duplex was done in June 2014. He has an upcoming appointment with Dr. Janace Hoard, ENT, to evaluate his balance.  Past Medical History  Diagnosis Date  . Barrett's esophagus   . Herpes   . Gout   . Prostatitis   . Cataract   . Glaucoma   . Elevated PSA   . Hypertrophy of prostate without urinary obstruction and other lower urinary tract symptoms (LUTS)   . Vertigo   . Memory change 04/19/2013  . Impotence of organic origin   . Stroke ([place])     tia in  2010  . Prostate cancer ([place])   . GERD (gastroesophageal reflux disease)   . Insomnia   . HOH (hard of hearing)   . Hiatal hernia   . Diverticulosis of colon (without mention of hemorrhage)   . Esophageal stricture   . History of colon cancer   . Hypersomnia, persistent 11/24/2013    Reports Epworth of 10 and higher on CPAP with  good compliance.   Marland Kitchen TIA (transient ischemic attack)   . Hyperlipidemia    Past Surgical History  Procedure Laterality Date  . Refractive surgery    . Intracapsular cataract extraction      Lt eye  . Eye surgery      both cataracts  . Mass excision N/A 07/29/2013    Procedure: MINOR EXCISION OF CHEST KELOID  CONTRACTURE;  Surgeon: Theodoro Kos, DO;  Location: [place];  Service: Plastics;  Laterality: N/A;  ACELL placement  . Minor graft application N/A [id number]    Procedure: MINOR GRAFT APPLICATION A CELL;  Surgeon: Theodoro Kos, DO;  Location: [place];  Service: Plastics;  Laterality: N/A;   Social History   Social History  . Marital Status: Single    Spouse Name: N/A  . Number of Children: 2  . Years of Education: 15   Occupational History  . Retired    Social History Main Topics  . Smoking status: Former Smoker    Quit date: 07/23/1962  . Smokeless tobacco: Never Used     Comment: Stopped in 1963  . Alcohol Use: 8.4 oz/week    14 Standard drinks or equivalent per week     Comment: 2-3 drinks nightly  . Drug Use: No  . Sexual Activity: Yes     Comment: 5 partners   Other Topics Concern  . None   Social History Narrative   Patient is single.   Patient has two children.   Patient is retired.   Patient has a college education.   Patient is right-handed.   Patient drinks about 3 cups  of coffee daily.   Patient gets regular exercise.   Family History  Problem Relation Age of Onset  . Colon cancer Neg Hx   . Cirrhosis Mother   . Jaundice Mother   . Hyperlipidemia Father   . Heart disease Father    Allergies  Allergen Reactions  . Sulfamethoxazole Other (See Comments)    Constipation  . Sulfamethoxazole-Trimethoprim     constipation   Prior to Admission medications   Medication Sig Start Date End Date Taking? Authorizing Provider  allopurinol (ZYLOPRIM) 100 MG tablet Take 2 tablets (200 mg total) by mouth daily. 04/18/15  Yes [name], MD  aspirin 81 MG tablet Take 81 mg by mouth daily.     Yes Historical Provider, MD  cycloSPORINE (RESTASIS) 0.05 % ophthalmic emulsion 1 drop 2 (two) times daily. As needed.    Yes Historical Provider, MD  donepezil (ARICEPT) 10 MG tablet Take 1 tablet (10 mg total) by mouth at bedtime.  05/16/15  Yes Kathrynn Ducking, MD  finasteride (PROSCAR) 5 MG tablet Take 5 mg by mouth daily.   Yes Historical Provider, MD  mirtazapine (REMERON) 45 MG tablet Take 1 tablet (45 mg total) by mouth at bedtime. 05/16/15  Yes Kathrynn Ducking, MD  Multiple Vitamin (MULTIVITAMIN) capsule Take 1 capsule by mouth daily.   Yes Historical Provider, MD  NEXIUM 40 MG capsule TAKE 1 CAPSULE BY MOUTH DAILY BEFORE BREAKFAST. 09/20/14  Yes Ladene Artist, MD  polyethylene glycol [facility] / Floria Raveling) packet Take 17 g by mouth daily as needed.    Yes Historical Provider, MD  valACYclovir (VALTREX) 1000 MG tablet TAKE 1/2 TABLET BY MOUTH ONCE DAILY 10/20/14  Yes [name], MD     ROS: The patient denies fevers, chills, night sweats, unintentional weight loss, chest pain, palpitations, wheezing, dyspnea on exertion, nausea, vomiting, abdominal pain, dysuria, hematuria, melena, numbness, weakness, or tingling.  All other systems have been reviewed and were otherwise negative with the exception of those mentioned in the HPI and as above.    PHYSICAL EXAM: Filed Vitals:   12/10/15 1508  BP: 132/64  Pulse: 66  Temp: 97.7 F (36.5 C)  Resp: 16   Body mass index is 24.02 kg/(m^2).   General: Alert, no acute distress HEENT:  Normocephalic, atraumatic, oropharynx patent. Eye: [facility], [facility]. Bilateral cataracts surgery. Cardiovascular:  Regular rate and rhythm, no rubs murmurs or gallops.  No Carotid bruits, radial pulse intact. No pedal edema.  Respiratory: Clear to auscultation bilaterally.  No wheezes, rales, or rhonchi.  No cyanosis, no use of accessory musculature Abdominal: No organomegaly, abdomen is soft and non-tender, positive bowel sounds.  No masses. Musculoskeletal: Gait intact. No edema, tenderness Skin: No rashes. Neurologic: Facial musculature symmetric. Psychiatric: Patient acts appropriately throughout our interaction. Lymphatic: No cervical or submandibular  lymphadenopathy    LABS:    EKG/XRAY:   Primary read interpreted by [facility] Everlene Farrier at [facility].   ASSESSMENT/PLAN: Patient concerned about his carotid and gait disorder. He is also had one episode of visual distortion which resolved after taking an extra baby aspirin. We'll proceed with MRI of the brain as well as carotid studies. He had a recent checkup with Dr. [name] who suggested possibly an MRI if he had worsening symptoms.I personally performed the services described in this documentation, which was scribed in my presence. The recorded information has been reviewed and is accurate.   Gross sideeffects, risk and benefits, and alternatives of medications d/w patient. Patient is  aware that all medications have potential sideeffects and we are unable to predict every sideeffect or drug-drug interaction that may occur.  Arlyss Queen MD 12/10/2015 3:29 PM

## 2015-12-17 ENCOUNTER — Inpatient Hospital Stay: Admission: RE | Admit: 2015-12-17 | Payer: Medicare Other | Source: Ambulatory Visit

## 2015-12-18 ENCOUNTER — Other Ambulatory Visit: Payer: Medicare Other

## 2015-12-22 DIAGNOSIS — R42 Dizziness and giddiness: Secondary | ICD-10-CM | POA: Diagnosis not present

## 2015-12-22 DIAGNOSIS — H903 Sensorineural hearing loss, bilateral: Secondary | ICD-10-CM | POA: Insufficient documentation

## 2015-12-22 DIAGNOSIS — R490 Dysphonia: Secondary | ICD-10-CM | POA: Diagnosis not present

## 2015-12-22 DIAGNOSIS — K219 Gastro-esophageal reflux disease without esophagitis: Secondary | ICD-10-CM | POA: Diagnosis not present

## 2015-12-25 ENCOUNTER — Ambulatory Visit
Admission: RE | Admit: 2015-12-25 | Discharge: 2015-12-25 | Disposition: A | Discharge status: Home / Self Care | Payer: PPO | Source: Ambulatory Visit | Attending: Emergency Medicine | Admitting: Emergency Medicine

## 2015-12-25 DIAGNOSIS — I6523 Occlusion and stenosis of bilateral carotid arteries: Secondary | ICD-10-CM | POA: Diagnosis not present

## 2015-12-25 DIAGNOSIS — H539 Unspecified visual disturbance: Secondary | ICD-10-CM

## 2016-01-01 ENCOUNTER — Other Ambulatory Visit: Payer: Self-pay | Admitting: Neurology

## 2016-01-01 ENCOUNTER — Other Ambulatory Visit: Payer: Self-pay | Admitting: Emergency Medicine

## 2016-01-03 ENCOUNTER — Telehealth: Payer: Self-pay | Admitting: Radiology

## 2016-01-03 NOTE — Telephone Encounter (Signed)
Spoke to patient regarding carotid US he has asked for low cholesterol diet. I have mailed to him

## 2016-01-03 NOTE — Telephone Encounter (Signed)
Do you want to refill it hasn't been filled since 12/15. His last physical was 6/16

## 2016-01-22 DIAGNOSIS — H40053 Ocular hypertension, bilateral: Secondary | ICD-10-CM | POA: Diagnosis not present

## 2016-01-22 DIAGNOSIS — H16223 Keratoconjunctivitis sicca, not specified as Sjogren's, bilateral: Secondary | ICD-10-CM | POA: Diagnosis not present

## 2016-01-22 DIAGNOSIS — Z961 Presence of intraocular lens: Secondary | ICD-10-CM | POA: Diagnosis not present

## 2016-02-05 DIAGNOSIS — H40053 Ocular hypertension, bilateral: Secondary | ICD-10-CM | POA: Diagnosis not present

## 2016-02-05 DIAGNOSIS — H16223 Keratoconjunctivitis sicca, not specified as Sjogren's, bilateral: Secondary | ICD-10-CM | POA: Diagnosis not present

## 2016-02-05 DIAGNOSIS — Z961 Presence of intraocular lens: Secondary | ICD-10-CM | POA: Diagnosis not present

## 2016-02-06 DIAGNOSIS — C61 Malignant neoplasm of prostate: Secondary | ICD-10-CM | POA: Diagnosis not present

## 2016-02-12 DIAGNOSIS — N400 Enlarged prostate without lower urinary tract symptoms: Secondary | ICD-10-CM | POA: Diagnosis not present

## 2016-02-12 DIAGNOSIS — N5201 Erectile dysfunction due to arterial insufficiency: Secondary | ICD-10-CM | POA: Diagnosis not present

## 2016-02-12 DIAGNOSIS — C61 Malignant neoplasm of prostate: Secondary | ICD-10-CM | POA: Diagnosis not present

## 2016-02-12 DIAGNOSIS — R972 Elevated prostate specific antigen [PSA]: Secondary | ICD-10-CM | POA: Diagnosis not present

## 2016-02-12 DIAGNOSIS — N62 Hypertrophy of breast: Secondary | ICD-10-CM | POA: Diagnosis not present

## 2016-02-12 DIAGNOSIS — Z0000 Encounter for general adult medical examination without abnormal findings: Secondary | ICD-10-CM | POA: Diagnosis not present

## 2016-02-13 ENCOUNTER — Other Ambulatory Visit: Payer: Self-pay | Admitting: Urology

## 2016-02-13 DIAGNOSIS — N62 Hypertrophy of breast: Secondary | ICD-10-CM

## 2016-02-14 ENCOUNTER — Ambulatory Visit
Admission: RE | Admit: 2016-02-14 | Discharge: 2016-02-14 | Disposition: A | Discharge status: Home / Self Care | Payer: PPO | Source: Ambulatory Visit | Attending: Urology | Admitting: Urology

## 2016-02-14 ENCOUNTER — Other Ambulatory Visit: Payer: Self-pay | Admitting: Urology

## 2016-02-14 DIAGNOSIS — N62 Hypertrophy of breast: Secondary | ICD-10-CM

## 2016-02-14 DIAGNOSIS — R928 Other abnormal and inconclusive findings on diagnostic imaging of breast: Secondary | ICD-10-CM | POA: Diagnosis not present

## 2016-02-14 DIAGNOSIS — N6082 Other benign mammary dysplasias of left breast: Secondary | ICD-10-CM | POA: Diagnosis not present

## 2016-04-24 ENCOUNTER — Other Ambulatory Visit: Payer: Self-pay | Admitting: Emergency Medicine

## 2016-04-24 ENCOUNTER — Other Ambulatory Visit: Payer: Self-pay | Admitting: Neurology

## 2016-05-21 ENCOUNTER — Encounter: Payer: Self-pay | Admitting: Adult Health

## 2016-05-21 ENCOUNTER — Ambulatory Visit (INDEPENDENT_AMBULATORY_CARE_PROVIDER_SITE_OTHER): Payer: PPO | Admitting: Adult Health

## 2016-05-21 VITALS — BP 126/79 | HR 81 | Ht 66.0 in | Wt 143.4 lb

## 2016-05-21 DIAGNOSIS — G4733 Obstructive sleep apnea (adult) (pediatric): Secondary | ICD-10-CM | POA: Diagnosis not present

## 2016-05-21 DIAGNOSIS — Z9989 Dependence on other enabling machines and devices: Principal | ICD-10-CM

## 2016-05-21 MED ORDER — MIRTAZAPINE 45 MG PO TABS: 45.0000 mg | ORAL_TABLET | Freq: Every day | ORAL | Status: DC

## 2016-05-21 MED ORDER — DONEPEZIL HCL 10 MG PO TABS: ORAL_TABLET | ORAL | Status: DC

## 2016-05-21 NOTE — Progress Notes (Signed)
PATIENT: [name] DOB: [birth year]  REASON FOR VISIT: follow up-memory disorder HISTORY FROM: patient  HISTORY OF PRESENT ILLNESS: [name] is a 81 year old male with a history of memory disorder. He returns today for follow-up. He reports that his memory has remained stable. He is currently on Aricept and tolerating it well. He states that before he started Aricept he was playing a card game with a friend for year and never won. He states that since he has started Aricept he has won every single game. He is able to complete all ADLs independently. He lives at home alone. He states that he is sleeping well with Remeron. He is able to prepare his own meals and handle his finances without difficulty. He operates a Teacher, music without difficulty. Denies any changes with his gait or balance. Denies any recent falls. He reports that he is using the CPAP nightly. He has not had a download in over a year. He does report that he needs new supplies. I advised patient that we would try to get a wireless download. Patient returns today for an evaluation.  HISTORY 11/20/15 ([name]): [name] is a 81 year old right-handed white male with a history of a memory disorder. The patient is on CPAP for obstructive sleep apnea. He also has reported some problems with chronic insomnia. He has been placed on Remeron taking 45 mg at night, this dose increase has helped his sleeping. He is still somewhat drowsy during the day, however. The patient reports that in the last 2 weeks he has had some difficulty with balance. Prior CT and MRI brain evaluation has shown some evidence of chronic small vessel disease. The patient denies any numbness or burning sensations in the feet. He has not developed any new weakness of extremities. The patient returns this office for an evaluation. He is on Aricept, tolerating the medication well.  REVIEW OF SYSTEMS: Out of a complete 14 system review of symptoms, the patient  complains only of the following symptoms, and all other reviewed systems are negative.  See history of present illness  ALLERGIES: Allergies  Allergen Reactions  . Sulfamethoxazole Other (See Comments)    Constipation  . Sulfamethoxazole-Trimethoprim     constipation    HOME MEDICATIONS: Outpatient Prescriptions Prior to Visit  Medication Sig Dispense Refill  . allopurinol (ZYLOPRIM) 100 MG tablet TAKE 2 TABLETS BY MOUTH DAILY 180 tablet 0  . aspirin 81 MG tablet Take 81 mg by mouth daily.      . cycloSPORINE (RESTASIS) 0.05 % ophthalmic emulsion 1 drop 2 (two) times daily. As needed.     . donepezil (ARICEPT) 10 MG tablet TAKE 1 TABLET BY MOUTH ATBEDTIME 90 tablet 3  . finasteride (PROSCAR) 5 MG tablet Take 5 mg by mouth daily.    . mirtazapine (REMERON) 45 MG tablet Take 1 tablet (45 mg total) by mouth at bedtime. 90 tablet 1  . Multiple Vitamin (MULTIVITAMIN) capsule Take 1 capsule by mouth daily.    Marland Kitchen NEXIUM 40 MG capsule TAKE 1 CAPSULE BY MOUTH DAILY BEFORE BREAKFAST. 90 capsule 6  . polyethylene glycol (MIRALAX / GLYCOLAX) packet Take 17 g by mouth daily as needed.     . valACYclovir (VALTREX) 1000 MG tablet TAKE 1/2 TABLET BY MOUTH ONCE DAILY 45 tablet 1  . mirtazapine (REMERON) 30 MG tablet TAKE 1 TABLET BY MOUTH ATBEDTIME. 90 tablet 3   No facility-administered medications prior to visit.    PAST MEDICAL HISTORY: Past Medical History  Diagnosis Date  . Barrett's esophagus   . Herpes   . Gout   . Prostatitis   . Cataract   . Glaucoma   . Elevated PSA   . Hypertrophy of prostate without urinary obstruction and other lower urinary tract symptoms (LUTS)   . Vertigo   . Memory change 04/19/2013  . Impotence of organic origin   . Stroke ([place])     tia in  2010  . Prostate cancer ([place])   . GERD (gastroesophageal reflux disease)   . Insomnia   . HOH (hard of hearing)   . Hiatal hernia   . Diverticulosis of colon (without mention of hemorrhage)   . Esophageal  stricture   . History of colon cancer   . Hypersomnia, persistent 11/24/2013    Reports Epworth of 10 and higher on CPAP with  good compliance.   Marland Kitchen TIA (transient ischemic attack)   . Hyperlipidemia     PAST SURGICAL HISTORY: Past Surgical History  Procedure Laterality Date  . Refractive surgery    . Intracapsular cataract extraction      Lt eye  . Eye surgery      both cataracts  . Mass excision N/A 07/29/2013    Procedure: MINOR EXCISION OF CHEST KELOID CONTRACTURE;  Surgeon: Theodoro Kos, DO;  Location: [facility];  Service: Plastics;  Laterality: N/A;  ACELL placement  . Minor graft application N/A [id number]    Procedure: MINOR GRAFT APPLICATION A CELL;  Surgeon: Theodoro Kos, DO;  Location: [facility];  Service: Plastics;  Laterality: N/A;    FAMILY HISTORY: Family History  Problem Relation Age of Onset  . Colon cancer Neg Hx   . Cirrhosis Mother   . Jaundice Mother   . Hyperlipidemia Father   . Heart disease Father     SOCIAL HISTORY: Social History   Social History  . Marital Status: Single    Spouse Name: N/A  . Number of Children: 2  . Years of Education: 15   Occupational History  . Retired    Social History Main Topics  . Smoking status: Former Smoker    Quit date: 07/23/1962  . Smokeless tobacco: Never Used     Comment: Stopped in 1963  . Alcohol Use: 8.4 oz/week    14 Standard drinks or equivalent per week     Comment: 2-3 drinks nightly  . Drug Use: No  . Sexual Activity: Yes     Comment: 5 partners   Other Topics Concern  . Not on file   Social History Narrative   Patient is single.   Patient has two children.   Patient is retired.   Patient has a college education.   Patient is right-handed.   Patient drinks about 3 cups of coffee daily.   Patient gets regular exercise.      PHYSICAL EXAM  Filed Vitals:   05/21/16 0848  BP: 126/79  Pulse: 81  Height: 5\' 6"  (1.676 m)  Weight: 143 lb 6.4 oz  (65.046 kg)   Body mass index is 23.16 kg/(m^2). MMSE - Mini Mental State Exam 05/21/2016 11/20/2015 05/16/2015  Orientation to time 5 5 5   Orientation to Place 5 5 5   Registration 3 3 3   Attention/ Calculation 4 5 5   Recall 3 3 3   Language- name 2 objects 2 2 2   Language- repeat 1 1 1   Language- follow 3 step command 3 2 3   Language- read & follow direction 1 1 1  Write a sentence 1 0 1  Copy design 1 1 1   Total score 29 28 30     Generalized: Well developed, in no acute distress   Neurological examination  Mentation: Alert oriented to time, place, history taking. Follows all commands speech and language fluent Cranial nerve II-XII: Pupils were equal round reactive to light. Extraocular movements were full, visual field were full on confrontational test. Facial sensation and strength were normal. Uvula tongue midline. Head turning and shoulder shrug  were normal and symmetric. Motor: The motor testing reveals 5 over 5 strength of all 4 extremities. Good symmetric motor tone is noted throughout.  Sensory: Sensory testing is intact to soft touch on all 4 extremities. No evidence of extinction is noted.  Coordination: Cerebellar testing reveals good finger-nose-finger and heel-to-shin bilaterally.  Gait and station: Gait is normal. Tandem gait is slightly unsteady. Romberg is negative. No drift is seen.  Reflexes: Deep tendon reflexes are symmetric and normal bilaterally.   DIAGNOSTIC DATA (LABS, IMAGING, TESTING) - I reviewed patient records, labs, notes, testing and imaging myself where available.  Lab Results  Component Value Date   WBC 7.7 04/18/2015   HGB 16.1 04/18/2015   HCT 47.6 04/18/2015   MCV 91.9 04/18/2015   PLT 309 04/18/2015      Component Value Date/Time   NA 140 04/18/2015 0826   K 4.3 04/18/2015 0826   CL 102 04/18/2015 0826   CO2 28 04/18/2015 0826   GLUCOSE 88 04/18/2015 0826   BUN 14 04/18/2015 0826   CREATININE 0.79 04/18/2015 0826   CREATININE 0.75  07/26/2013 1130   CALCIUM 9.8 04/18/2015 0826   PROT 7.1 04/18/2015 0826   ALBUMIN 4.5 04/18/2015 0826   AST 20 04/18/2015 0826   ALT 18 04/18/2015 0826   ALKPHOS 75 04/18/2015 0826   BILITOT 0.8 04/18/2015 0826   GFRNONAA 87 04/18/2015 0826   GFRNONAA 87* 07/26/2013 1130   GFRAA >89 04/18/2015 0826   GFRAA >90 07/26/2013 1130   Lab Results  Component Value Date   CHOL 205* 04/18/2015   HDL 56 04/18/2015   LDLCALC 121* 04/18/2015   TRIG 139 04/18/2015   CHOLHDL 3.7 04/18/2015   No results found for: HGBA1C Lab Results  Component Value Date   VITAMINB12 1566* 04/18/2015   Lab Results  Component Value Date   TSH 3.116 04/18/2015      ASSESSMENT AND PLAN 81 y.o. year old male  has a past medical history of Barrett's esophagus; Herpes; Gout; Prostatitis; Cataract; Glaucoma; Elevated PSA; Hypertrophy of prostate without urinary obstruction and other lower urinary tract symptoms (LUTS); Vertigo; Memory change (04/19/2013); Impotence of organic origin; Stroke [facility]); Prostate cancer ([place]); GERD (gastroesophageal reflux disease); Insomnia; HOH (hard of hearing); Hiatal hernia; Diverticulosis of colon (without mention of hemorrhage); Esophageal stricture; History of colon cancer; Hypersomnia, persistent (11/24/2013); TIA (transient ischemic attack); and Hyperlipidemia. here with:  1. Memory disturbance 2. Obstructive sleep apnea on CPAP  The patient's memory score has remained stable. He will continue on Aricept. He will continue on Remeron for insomnia. Patient advised to bring his CPAP machine and supplies to the next office visit. At that time we will complete a download. I will send a prescription today for new supplies. Patient advised that if his symptoms worsen or he develops any new symptoms he should let us know. Will follow-up in 6 months or sooner if needed.     Ward Givens, MSN, NP-C 05/21/2016, 9:00 AM [facility] [address],  [place], [place] [address] [phone]

## 2016-05-21 NOTE — Progress Notes (Signed)
I have read the note, and I agree with the clinical assessment and plan.  Thandiwe Siragusa KEITH   

## 2016-05-21 NOTE — Patient Instructions (Signed)
Continue Aricept Memory score is stable If your symptoms worsen or you develop new symptoms please let us know.   

## 2016-05-27 DIAGNOSIS — G4733 Obstructive sleep apnea (adult) (pediatric): Secondary | ICD-10-CM | POA: Diagnosis not present

## 2016-07-24 ENCOUNTER — Encounter: Payer: Self-pay | Admitting: Family Medicine

## 2016-07-24 ENCOUNTER — Ambulatory Visit (INDEPENDENT_AMBULATORY_CARE_PROVIDER_SITE_OTHER): Payer: PPO | Admitting: Family Medicine

## 2016-07-24 VITALS — BP 110/64 | HR 76 | Temp 97.9°F | Resp 18 | Ht 66.0 in | Wt 141.2 lb

## 2016-07-24 DIAGNOSIS — R6882 Decreased libido: Secondary | ICD-10-CM

## 2016-07-24 DIAGNOSIS — E559 Vitamin D deficiency, unspecified: Secondary | ICD-10-CM | POA: Diagnosis not present

## 2016-07-24 DIAGNOSIS — R351 Nocturia: Secondary | ICD-10-CM

## 2016-07-24 DIAGNOSIS — Z130 Encounter for screening for diseases of the blood and blood-forming organs and certain disorders involving the immune mechanism: Secondary | ICD-10-CM

## 2016-07-24 DIAGNOSIS — G4700 Insomnia, unspecified: Secondary | ICD-10-CM

## 2016-07-24 DIAGNOSIS — Z1329 Encounter for screening for other suspected endocrine disorder: Secondary | ICD-10-CM | POA: Diagnosis not present

## 2016-07-24 DIAGNOSIS — G4733 Obstructive sleep apnea (adult) (pediatric): Secondary | ICD-10-CM | POA: Diagnosis not present

## 2016-07-24 DIAGNOSIS — Z79899 Other long term (current) drug therapy: Secondary | ICD-10-CM | POA: Diagnosis not present

## 2016-07-24 DIAGNOSIS — G4730 Sleep apnea, unspecified: Secondary | ICD-10-CM

## 2016-07-24 DIAGNOSIS — Z0000 Encounter for general adult medical examination without abnormal findings: Secondary | ICD-10-CM | POA: Diagnosis not present

## 2016-07-24 DIAGNOSIS — R634 Abnormal weight loss: Secondary | ICD-10-CM | POA: Diagnosis not present

## 2016-07-24 DIAGNOSIS — Z1389 Encounter for screening for other disorder: Secondary | ICD-10-CM | POA: Diagnosis not present

## 2016-07-24 DIAGNOSIS — Z1383 Encounter for screening for respiratory disorder NEC: Secondary | ICD-10-CM

## 2016-07-24 DIAGNOSIS — Z136 Encounter for screening for cardiovascular disorders: Secondary | ICD-10-CM

## 2016-07-24 DIAGNOSIS — Z23 Encounter for immunization: Secondary | ICD-10-CM | POA: Diagnosis not present

## 2016-07-24 LAB — CBC
HCT: 44.9 % (ref 38.5–50.0)
HEMOGLOBIN: 15.5 g/dL (ref 13.2–17.1)
MCH: 30.5 pg (ref 27.0–33.0)
MCHC: 34.5 g/dL (ref 32.0–36.0)
MCV: 88.4 fL (ref 80.0–100.0)
MPV: 9.6 fL (ref 7.5–12.5)
PLATELETS: 303 10*3/uL (ref 140–400)
RBC: 5.08 MIL/uL (ref 4.20–5.80)
RDW: 13.6 % (ref 11.0–15.0)
WBC: 8.7 10*3/uL (ref 3.8–10.8)

## 2016-07-24 LAB — COMPREHENSIVE METABOLIC PANEL
ALT: 12 U/L (ref 9–46)
AST: 20 U/L (ref 10–35)
Albumin: 4.7 g/dL (ref 3.6–5.1)
Alkaline Phosphatase: 77 U/L (ref 40–115)
BILIRUBIN TOTAL: 0.6 mg/dL (ref 0.2–1.2)
BUN: 12 mg/dL (ref 7–25)
CHLORIDE: 104 mmol/L (ref 98–110)
CO2: 28 mmol/L (ref 20–31)
CREATININE: 0.78 mg/dL (ref 0.70–1.18)
Calcium: 10.3 mg/dL (ref 8.6–10.3)
GLUCOSE: 82 mg/dL (ref 65–99)
Potassium: 5 mmol/L (ref 3.5–5.3)
SODIUM: 143 mmol/L (ref 135–146)
Total Protein: 7.3 g/dL (ref 6.1–8.1)

## 2016-07-24 LAB — VITAMIN B12: Vitamin B-12: 656 pg/mL (ref 200–1100)

## 2016-07-24 NOTE — Patient Instructions (Addendum)
  Consider something else for sleep since the remeron isn't working - we could back it down to 30 and add in trazodone.  Or we can add in a small dose of clonzepam to help with sleep.  Remember to come fasting to your next visit in 3-4 mos so we can check your weight, discuss sleep meds, and check your cholesterol.   IF you received an x-ray today, you will receive an invoice from [facility]. Please contact [facility] at [phone] with questions or concerns regarding your invoice.   IF you received labwork today, you will receive an invoice from [employer]. Please contact [facility] at [phone] with questions or concerns regarding your invoice.   Our billing staff will not be able to assist you with questions regarding bills from these companies.  You will be contacted with the lab results as soon as they are available. The fastest way to get your results is to activate your My Chart account. Instructions are located on the last page of this paperwork. If you have not heard from [place] regarding the results in 2 weeks, please contact this office.     [name] , Thank you for taking time to come for your Medicare Wellness Visit. I appreciate your ongoing commitment to your health goals. Please review the following plan we discussed and let me know if I can assist you in the future.   These are the goals we discussed: Goals    None      This is a list of the screening recommended for you and due dates:  Health Maintenance  Topic Date Due  . Shingles Vaccine  06/04/1997  . Flu Shot  06/04/2016  . Colon Cancer Screening  11/12/2016  . Tetanus Vaccine  02/20/2018  . Pneumonia vaccines  Completed

## 2016-07-24 NOTE — Progress Notes (Signed)
Subjective:    [name] is a 81 y.o. male who presents for Medicare Annual/Subsequent preventive examination.   Preventive Screening-Counseling & Management  Tobacco History  Smoking Status  . Former Smoker  . Quit date: 07/23/1962  Smokeless Tobacco  . Never Used    Comment: Stopped in 1963    Problems Prior to Visit 1.   Unitentional weight loss this past 6 mos. No sweats, appetite nml, has OSA.  Had dose increased on remeron from 30 to 45mg  about 6 mos piror. Due to poor sleepw hich did initially help until last mo. - now will only sleep 3-4 hrs, wake uup to urinate then has a hard time falling back to sleep. Normal bowels. 2.   OSA: Has been on CPAP since 2014.  Does use a   Urology - is going to call because of new nocturia of 1x/night.  Current Problems (verified) Patient Active Problem List   Diagnosis Date Noted  . OSA on CPAP 11/24/2013  . Hypersomnia, persistent 11/24/2013  . Sebaceous cyst 07/29/2013  . Sleep apnea with use of continuous positive airway pressure (CPAP) 07/22/2013  . Insomnia, persistent 07/22/2013  . Abnormal chest percussion 07/22/2013  . Memory change 04/19/2013  . Subjective vision disturbance 04/19/2013  . OSA (obstructive sleep apnea) 04/06/2013  . Other symptoms involving digestive system(787.99) 11/13/2011  . Loss of weight 11/13/2011  . Anal or rectal pain 11/13/2011  . Personal history of colonic polyps 11/13/2011  . HSV 02/16/2010  . DRY EYE SYNDROME 02/16/2010  . ESOPHAGEAL STRICTURE 02/16/2010  . GERD 02/16/2010    Medications Prior to Visit Current Outpatient Prescriptions on File Prior to Visit  Medication Sig Dispense Refill  . allopurinol (ZYLOPRIM) 100 MG tablet TAKE 2 TABLETS BY MOUTH DAILY 180 tablet 0  . aspirin 81 MG tablet Take 81 mg by mouth daily.      . cycloSPORINE (RESTASIS) 0.05 % ophthalmic emulsion 1 drop 2 (two) times daily. As needed.     . donepezil (ARICEPT) 10 MG tablet TAKE 1 TABLET BY MOUTH  ATBEDTIME 90 tablet 3  . finasteride (PROSCAR) 5 MG tablet Take 5 mg by mouth daily.    Marland Kitchen MELATONIN PO Take by mouth at bedtime as needed.    . mirtazapine (REMERON) 45 MG tablet Take 1 tablet (45 mg total) by mouth at bedtime. 90 tablet 3  . Multiple Vitamin (MULTIVITAMIN) capsule Take 1 capsule by mouth daily.    Marland Kitchen NEXIUM 40 MG capsule TAKE 1 CAPSULE BY MOUTH DAILY BEFORE BREAKFAST. 90 capsule 6  . polyethylene glycol (MIRALAX / GLYCOLAX) packet Take 17 g by mouth daily as needed.     . valACYclovir (VALTREX) 1000 MG tablet TAKE 1/2 TABLET BY MOUTH ONCE DAILY 45 tablet 1   No current facility-administered medications on file prior to visit.     Current Medications (verified) Current Outpatient Prescriptions  Medication Sig Dispense Refill  . allopurinol (ZYLOPRIM) 100 MG tablet TAKE 2 TABLETS BY MOUTH DAILY 180 tablet 0  . aspirin 81 MG tablet Take 81 mg by mouth daily.      . cycloSPORINE (RESTASIS) 0.05 % ophthalmic emulsion 1 drop 2 (two) times daily. As needed.     . donepezil (ARICEPT) 10 MG tablet TAKE 1 TABLET BY MOUTH ATBEDTIME 90 tablet 3  . finasteride (PROSCAR) 5 MG tablet Take 5 mg by mouth daily.    Marland Kitchen MELATONIN PO Take by mouth at bedtime as needed.    . mirtazapine (REMERON)  45 MG tablet Take 1 tablet (45 mg total) by mouth at bedtime. 90 tablet 3  . Multiple Vitamin (MULTIVITAMIN) capsule Take 1 capsule by mouth daily.    Marland Kitchen NEXIUM 40 MG capsule TAKE 1 CAPSULE BY MOUTH DAILY BEFORE BREAKFAST. 90 capsule 6  . polyethylene glycol (MIRALAX / GLYCOLAX) packet Take 17 g by mouth daily as needed.     . valACYclovir (VALTREX) 1000 MG tablet TAKE 1/2 TABLET BY MOUTH ONCE DAILY 45 tablet 1   No current facility-administered medications for this visit.      Allergies (verified) Sulfamethoxazole and Sulfamethoxazole-trimethoprim   PAST HISTORY  Family History Family History  Problem Relation Age of Onset  . Colon cancer Neg Hx   . Cirrhosis Mother   . Jaundice Mother   .  Hyperlipidemia Father   . Heart disease Father     Social History Social History  Substance Use Topics  . Smoking status: Former Smoker    Quit date: 07/23/1962  . Smokeless tobacco: Never Used     Comment: Stopped in 1963  . Alcohol use 8.4 oz/week    14 Standard drinks or equivalent per week     Comment: 2-3 drinks nightly    Are there smokers in your home (other than you)?  No  Risk Factors Current exercise habits: Home exercise routine includes walking 1 hrs per day.  Dietary issues discussed: heart healthy   Cardiac risk factors: advanced age (older than 42 for men, 67 for women) and male gender.  Depression Screen  Depression screen [facility] 2/9 07/24/2016 12/10/2015 04/18/2015 04/14/2014  Decreased Interest 0 0 0 0  Down, Depressed, Hopeless 0 0 0 0  PHQ - 2 Score 0 0 0 0    (Note: if answer to either of the following is "Yes", a more complete depression screening is indicated)   Q1: Over the past two weeks, have you felt down, depressed or hopeless? No  Q2: Over the past two weeks, have you felt little interest or pleasure in doing things? No  Have you lost interest or pleasure in daily life? No  Do you often feel hopeless? No  Do you cry easily over simple problems? No  Activities of Daily Living In your present state of health, do you have any difficulty performing the following activities?:  Driving? No Managing money?  No Feeding yourself? No Getting from bed to chair? No Climbing a flight of stairs? No Preparing food and eating?: No Bathing or showering? No Getting dressed: No Getting to the toilet? No Using the toilet:No Moving around from place to place: No In the past year have you fallen or had a near fall?:No   Are you sexually active?  No  Do you have more than one partner?  No  Hearing Difficulties: No Do you often ask people to speak up or repeat themselves? No Do you experience ringing or noises in your ears? No Do you have difficulty  understanding soft or whispered voices? No   Do you feel that you have a problem with memory? Yes  Do you often misplace items? Yes  Do you feel safe at home?  Yes  Cognitive Testing  Alert? Yes  Normal Appearance?Yes  Oriented to person? Yes  Place? Yes   Time? Yes  Recall of three objects?  Yes  Can perform simple calculations? Yes  Displays appropriate judgment?Yes  Can read the correct time from a watch face?Yes   Advanced Directives have been discussed with the patient?  Yes   List the Names of Other Physician/Practitioners you currently use: 1.  See Care Teams list  Indicate any recent Medical Services you may have received from other than [facility] providers in the past year (date may be approximate).  Immunization History  Administered Date(s) Administered  . Influenza, Seasonal, Injecte, Preservative Fre 10/14/2012  . Influenza,inj,Quad PF,36+ Mos 09/15/2015  . Influenza-Unspecified 10/04/2013  . Pneumococcal Conjugate-13 04/18/2015  . Pneumococcal Polysaccharide-23 04/06/2013  . Td 02/03/2008    Screening Tests Health Maintenance  Topic Date Due  . ZOSTAVAX  06/04/1997  . INFLUENZA VACCINE  06/04/2016  . COLONOSCOPY  11/12/2016  . TETANUS/TDAP  02/20/2018  . PNA vac Low Risk Adult  Completed    All answers were reviewed with the patient and necessary referrals were made:  Murline Weigel, MD   07/24/2016   History reviewed: allergies, current medications, past family history, past medical history, past social history, past surgical history and problem list  Review of Systems Pertinent items are noted in HPI.    Objective:    Blood pressure 110/64, pulse 76, temperature 97.9 F (36.6 C), temperature source Oral, resp. rate 18, height 5\' 6"  (1.676 m), weight 141 lb 3.2 oz (64 kg), SpO2 98 %. Body mass index is 22.79 kg/m.  Visual Acuity Screening   Right eye Left eye Both eyes  Without correction: 20/25-1 20/20-1 20/20  With correction:       BP 110/64    Pulse 76   Temp 97.9 F (36.6 C) (Oral)   Resp 18   Ht 5\' 6"  (1.676 m)   Wt 141 lb 3.2 oz (64 kg)   SpO2 98%   BMI 22.79 kg/m   General Appearance:    Alert, cooperative, no distress, appears stated age  Head:    Normocephalic, without obvious abnormality, atraumatic  Eyes:    PERRL, conjunctiva/corneas clear, EOM's intact, fundi    benign, both eyes       Ears:    Normal TM's and external ear canals, both ears  Nose:   Nares normal, septum midline, mucosa normal, no drainage    or sinus tenderness  Throat:   Lips, mucosa, and tongue normal; teeth and gums normal  Neck:   Supple, symmetrical, trachea midline, no adenopathy;       thyroid:  No enlargement/tenderness/nodules; no carotid   bruit or JVD  Back:     Symmetric, no curvature, ROM normal, no CVA tenderness  Lungs:     Clear to auscultation bilaterally, respirations unlabored  Chest wall:    No tenderness or deformity  Heart:    Regular rate and rhythm, S1 and S2 normal, no murmur, rub   or gallop  Abdomen:     Soft, non-tender, bowel sounds active all four quadrants,    no masses, no organomegaly        Extremities:   Extremities normal, atraumatic, no cyanosis or edema  Pulses:   2+ and symmetric all extremities  Skin:   Skin color, texture, turgor normal, no rashes or lesions  Lymph nodes:   Cervical, supraclavicular, and axillary nodes normal  Neurologic:   CNII-XII intact. Normal strength, sensation and reflexes      throughout        Assessment:     1. Medicare annual wellness visit, subsequent   2. Screening for cardiovascular, respiratory, and genitourinary diseases   3. Screening for deficiency anemia   4. Screening for thyroid disorder   5. Unintentional weight loss  6. Sleep apnea with use of continuous positive airway pressure (CPAP)   7. Insomnia, persistent   8. Decreased libido   9. Nocturia   10. Need for prophylactic vaccination and inoculation against influenza   11. Vitamin D deficiency          Plan:     During the course of the visit the patient was educated and counseled about appropriate screening and preventive services including:    Diet review for nutrition referral? Yes ____  Not Indicated ____   Patient Instructions (the written plan) was given to the patient.  Medicare Attestation I have personally reviewed: The patient's medical and social history Their use of alcohol, tobacco or illicit drugs Their current medications and supplements The patient's functional ability including ADLs,fall risks, home safety risks, cognitive, and hearing and visual impairment Diet and physical activities Evidence for depression or mood disorders  The patient's weight, height, BMI, and visual acuity have been recorded in the chart.  I have made referrals, counseling, and provided education to the patient based on review of the above and I have provided the patient with a written personalized care plan for preventive services.    Teniya Filter, MD   07/24/2016   Not fasting so will get lipid panel next visit [place] is urinology Using probiotics rather than nexium - working. Was on ppi x 3 yrs but no change in hoarseness with this Still a little horse A ENT Dr. Janace Hoard said nml.   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 Migdalia Dk - vitiligo, keloid  Flu shot today  No gout flairs No hsv flairs - no outbreak x 4 years Did get shingles vaccine Does have a scale at home. Increased his vit D to 1000 since last year.    Uric acid    Occ dizziness, occ tinintus but not ocnstant or associated.  No HCPOA Left ear deaf - right nml - has hearing ad but doesn wear Injection in left TM, right nml.   No libido tfor 18 mos.  AAA for [place] Lipids at next visit   Orders Placed This Encounter  Procedures  . Flu Vaccine QUAD 36+ mos IM  . CBC  . Comprehensive metabolic panel  . Vitamin B12  . VITAMIN D 25 Hydroxy (Vit-D Deficiency, Fractures)   . Thyroid Panel With TSH  . Care order/instruction:    AVS printed - let patient go!    Delman Cheadle, M.D.  [facility] [address] [place], [place] [address] [phone] phone [phone] fax  08/05/16 9:36 AM

## 2016-07-25 LAB — THYROID PANEL WITH TSH
FREE THYROXINE INDEX: 2.8 (ref 1.4–3.8)
T3 Uptake: 30 % (ref 22–35)
T4, Total: 9.2 ug/dL (ref 4.5–12.0)
TSH: 1.92 mIU/L (ref 0.40–4.50)

## 2016-07-25 LAB — VITAMIN D 25 HYDROXY (VIT D DEFICIENCY, FRACTURES): Vit D, 25-Hydroxy: 35 ng/mL (ref 30–100)

## 2016-07-29 ENCOUNTER — Encounter: Payer: Self-pay | Admitting: Family Medicine

## 2016-08-19 DIAGNOSIS — H40053 Ocular hypertension, bilateral: Secondary | ICD-10-CM | POA: Diagnosis not present

## 2016-08-19 DIAGNOSIS — H16223 Keratoconjunctivitis sicca, not specified as Sjogren's, bilateral: Secondary | ICD-10-CM | POA: Diagnosis not present

## 2016-08-19 DIAGNOSIS — Z961 Presence of intraocular lens: Secondary | ICD-10-CM | POA: Diagnosis not present

## 2016-09-25 ENCOUNTER — Encounter: Payer: Self-pay | Admitting: Gastroenterology

## 2016-11-25 ENCOUNTER — Ambulatory Visit (INDEPENDENT_AMBULATORY_CARE_PROVIDER_SITE_OTHER): Payer: PPO | Admitting: Adult Health

## 2016-11-25 ENCOUNTER — Encounter: Payer: Self-pay | Admitting: Adult Health

## 2016-11-25 VITALS — BP 131/78 | HR 66 | Ht 66.0 in | Wt 143.5 lb

## 2016-11-25 DIAGNOSIS — G4733 Obstructive sleep apnea (adult) (pediatric): Secondary | ICD-10-CM

## 2016-11-25 DIAGNOSIS — Z9989 Dependence on other enabling machines and devices: Secondary | ICD-10-CM

## 2016-11-25 DIAGNOSIS — R413 Other amnesia: Secondary | ICD-10-CM | POA: Diagnosis not present

## 2016-11-25 NOTE — Patient Instructions (Signed)
We will increase pressure to 20cmh20 F/u in 2 months for a repeat download If your symptoms worsen or you develop new symptoms please let us know.

## 2016-11-25 NOTE — Progress Notes (Signed)
Fax confirmation received from [facility] fax [phone].

## 2016-11-25 NOTE — Progress Notes (Signed)
I agree with the assessment and plan as directed by NP .The patient is known to me .   Carleton [place], MD  

## 2016-11-25 NOTE — Progress Notes (Signed)
PATIENT: [name] DOB: [birth year]  REASON FOR VISIT: follow up- memory disturbance, objective sleep apnea HISTORY FROM: patient  HISTORY OF PRESENT ILLNESS: Today 11/25/2016: [name] is a 82 year old male with a history of memory disturbance and obstructive sleep apnea on CPAP. He returns today for follow-up. The patient's CPAP download indicates that he uses machine 28 out of 30 days for compliance of 93%. He only uses machine greater than 4 hour 12 out of 30 days for compliance of 40%. His residual AHI is 24.8 on a minimum pressure of 6cm of water with a maximum pressure 15 cm of water. His leak in the 95th percentile is 24.9 L/m. The patient's last download was approximately 2 years ago. At that time the download showed excellent treatment of his apnea. The patient feels that his memory has remained stable. He lives at home alone. He is able to complete all ADLs independently. He operates a Teacher, music without difficulty. He is able to manage his finances and prepares all meals without difficulty. He returns today for an evaluation.  HISTORY 05/21/16:  [name] is a 82 year old male with a history of memory disorder. He returns today for follow-up. He reports that his memory has remained stable. He is currently on Aricept and tolerating it well. He states that before he started Aricept he was playing a card game with a friend for year and never won. He states that since he has started Aricept he has won every single game. He is able to complete all ADLs independently. He lives at home alone. He states that he is sleeping well with Remeron. He is able to prepare his own meals and handle his finances without difficulty. He operates a Teacher, music without difficulty. Denies any changes with his gait or balance. Denies any recent falls. He reports that he is using the CPAP nightly. He has not had a download in over a year. He does report that he needs new supplies. I advised patient  that we would try to get a wireless download. Patient returns today for an evaluation.  HISTORY 11/20/15 (WILLIS): [name] is a 82 year old right-handed white male with a history of a memory disorder. The patient is on CPAP for obstructive sleep apnea. He also has reported some problems with chronic insomnia. He has been placed on Remeron taking 45 mg at night, this dose increase has helped his sleeping. He is still somewhat drowsy during the day, however. The patient reports that in the last 2 weeks he has had some difficulty with balance. Prior CT and MRI brain evaluation has shown some evidence of chronic small vessel disease. The patient denies any numbness or burning sensations in the feet. He has not developed any new weakness of extremities. The patient returns this office for an evaluation. He is on Aricept, tolerating the medication well.  REVIEW OF SYSTEMS: Out of a complete 14 system review of symptoms, the patient complains only of the following symptoms, and all other reviewed systems are negative.  See history of present illness  ALLERGIES: Allergies  Allergen Reactions  . Sulfamethoxazole Other (See Comments)    Constipation  . Sulfamethoxazole-Trimethoprim     constipation    HOME MEDICATIONS: Outpatient Medications Prior to Visit  Medication Sig Dispense Refill  . allopurinol (ZYLOPRIM) 100 MG tablet TAKE 2 TABLETS BY MOUTH DAILY 180 tablet 0  . aspirin 81 MG tablet Take 81 mg by mouth daily.      . cycloSPORINE (RESTASIS)  0.05 % ophthalmic emulsion 1 drop 2 (two) times daily. As needed.     . donepezil (ARICEPT) 10 MG tablet TAKE 1 TABLET BY MOUTH ATBEDTIME 90 tablet 3  . finasteride (PROSCAR) 5 MG tablet Take 5 mg by mouth daily.    Marland Kitchen MELATONIN PO Take by mouth at bedtime as needed.    . mirtazapine (REMERON) 45 MG tablet Take 1 tablet (45 mg total) by mouth at bedtime. 90 tablet 3  . Multiple Vitamin (MULTIVITAMIN) capsule Take 1 capsule by mouth daily.    Marland Kitchen NEXIUM  40 MG capsule TAKE 1 CAPSULE BY MOUTH DAILY BEFORE BREAKFAST. 90 capsule 6  . polyethylene glycol (MIRALAX / GLYCOLAX) packet Take 17 g by mouth daily as needed.     . valACYclovir (VALTREX) 1000 MG tablet TAKE 1/2 TABLET BY MOUTH ONCE DAILY 45 tablet 1   No facility-administered medications prior to visit.     PAST MEDICAL HISTORY: Past Medical History:  Diagnosis Date  . Barrett's esophagus   . Cataract   . Diverticulosis of colon (without mention of hemorrhage)   . Elevated PSA   . Esophageal stricture   . GERD (gastroesophageal reflux disease)   . Glaucoma   . Gout   . Herpes   . Hiatal hernia   . History of colon cancer   . HOH (hard of hearing)   . Hyperlipidemia   . Hypersomnia, persistent 11/24/2013   Reports Epworth of 10 and higher on CPAP with  good compliance.   . Hypertrophy of prostate without urinary obstruction and other lower urinary tract symptoms (LUTS)   . Impotence of organic origin   . Insomnia   . Memory change 04/19/2013  . Prostate cancer ([place])   . Prostatitis   . Stroke ([place])    tia in  2010  . TIA (transient ischemic attack)   . Vertigo     PAST SURGICAL HISTORY: Past Surgical History:  Procedure Laterality Date  . EYE SURGERY     both cataracts  . INTRACAPSULAR CATARACT EXTRACTION     Lt eye  . MASS EXCISION N/A 07/29/2013   Procedure: MINOR EXCISION OF CHEST KELOID CONTRACTURE;  Surgeon: Theodoro Kos, DO;  Location: [facility];  Service: Plastics;  Laterality: N/A;  ACELL placement  . MINOR GRAFT APPLICATION N/A [id number]   Procedure: MINOR GRAFT APPLICATION A CELL;  Surgeon: Theodoro Kos, DO;  Location: [facility];  Service: Plastics;  Laterality: N/A;  . REFRACTIVE SURGERY      FAMILY HISTORY: Family History  Problem Relation Age of Onset  . Colon cancer Neg Hx   . Cirrhosis Mother   . Jaundice Mother   . Hyperlipidemia Father   . Heart disease Father     SOCIAL HISTORY: Social History   Social  History  . Marital status: Single    Spouse name: N/A  . Number of children: 2  . Years of education: 15   Occupational History  . Retired    Social History Main Topics  . Smoking status: Former Smoker    Quit date: 07/23/1962  . Smokeless tobacco: Never Used     Comment: Stopped in 1963  . Alcohol use 8.4 oz/week    14 Standard drinks or equivalent per week     Comment: 2-3 drinks nightly  . Drug use: No  . Sexual activity: Yes     Comment: 5 partners   Other Topics Concern  . Not on file   Social  History Narrative   Patient is single.   Patient has two children.   Patient is retired.   Patient has a college education.   Patient is right-handed.   Patient drinks about 3 cups of coffee daily.   Patient gets regular exercise.      PHYSICAL EXAM  Vitals:   11/25/16 0811  Weight: 143 lb 8 oz (65.1 kg)  Height: 5\' 6"  (1.676 m)   Body mass index is 23.16 kg/m.   MMSE - Mini Mental State Exam 11/25/2016 05/21/2016 11/20/2015  Orientation to time 5 5 5   Orientation to Place 5 5 5   Registration 3 3 3   Attention/ Calculation 3 4 5   Recall 2 3 3   Language- name 2 objects 2 2 2   Language- repeat 1 1 1   Language- follow 3 step command 3 3 2   Language- read & follow direction 1 1 1   Write a sentence 1 1 0  Copy design 1 1 1   Total score 27 29 28      Generalized: Well developed, in no acute distress   Neurological examination  Mentation: Alert oriented to time, place, history taking. Follows all commands speech and language fluent Cranial nerve II-XII: Pupils were equal round reactive to light. Extraocular movements were full, visual field were full on confrontational test. Facial sensation and strength were normal. Uvula tongue midline. Head turning and shoulder shrug  were normal and symmetric. Motor: The motor testing reveals 5 over 5 strength of all 4 extremities. Good symmetric motor tone is noted throughout.  Sensory: Sensory testing is intact to soft touch on  all 4 extremities. No evidence of extinction is noted.  Coordination: Cerebellar testing reveals good finger-nose-finger and heel-to-shin bilaterally.  Gait and station: Gait is normal. Tandem gait is normal. Romberg is negative. No drift is seen.  Reflexes: Deep tendon reflexes are symmetric and normal bilaterally.   DIAGNOSTIC DATA (LABS, IMAGING, TESTING) - I reviewed patient records, labs, notes, testing and imaging myself where available.  Lab Results  Component Value Date   WBC 8.7 07/24/2016   HGB 15.5 07/24/2016   HCT 44.9 07/24/2016   MCV 88.4 07/24/2016   PLT 303 07/24/2016      Component Value Date/Time   NA 143 07/24/2016 1432   K 5.0 07/24/2016 1432   CL 104 07/24/2016 1432   CO2 28 07/24/2016 1432   GLUCOSE 82 07/24/2016 1432   BUN 12 07/24/2016 1432   CREATININE 0.78 07/24/2016 1432   CALCIUM 10.3 07/24/2016 1432   PROT 7.3 07/24/2016 1432   ALBUMIN 4.7 07/24/2016 1432   AST 20 07/24/2016 1432   ALT 12 07/24/2016 1432   ALKPHOS 77 07/24/2016 1432   BILITOT 0.6 07/24/2016 1432   GFRNONAA 87 04/18/2015 0826   GFRAA >89 04/18/2015 0826   Lab Results  Component Value Date   CHOL 205 (H) 04/18/2015   HDL 56 04/18/2015   LDLCALC 121 (H) 04/18/2015   TRIG 139 04/18/2015   CHOLHDL 3.7 04/18/2015    Lab Results  Component Value Date   VITAMINB12 656 07/24/2016   Lab Results  Component Value Date   TSH 1.92 07/24/2016      ASSESSMENT AND PLAN 82 y.o. year old male  has a past medical history of Barrett's esophagus; Cataract; Diverticulosis of colon (without mention of hemorrhage); Elevated PSA; Esophageal stricture; GERD (gastroesophageal reflux disease); Glaucoma; Gout; Herpes; Hiatal hernia; History of colon cancer; HOH (hard of hearing); Hyperlipidemia; Hypersomnia, persistent (11/24/2013); Hypertrophy of prostate without urinary  obstruction and other lower urinary tract symptoms (LUTS); Impotence of organic origin; Insomnia; Memory change (04/19/2013);  Prostate cancer ([place]); Prostatitis; Stroke [facility]); TIA (transient ischemic attack); and Vertigo. here with:  1. Obstructive sleep apnea on CPAP 2. Memory disturbance  The patient CPAP download shows high apnea level. I will increase his pressure to a minimum pressure of 6 cm ofwater maximum pressure of 20 cm of water. The patient is encouraged to use the CPAP greater than 4 hours each night. The patient's memory score has remained relatively stable. He will continue on Aricept. He will follow-up in 2 months for repeat download.     Ward Givens, MSN, NP-C 11/25/2016, 8:29 AM [facility] [address] [place], [place] [address] [phone]

## 2016-11-26 ENCOUNTER — Telehealth: Payer: Self-pay | Admitting: *Deleted

## 2016-11-26 NOTE — Telephone Encounter (Signed)
-----   Message from Patsy [place] sent at 11/26/2016  9:24 AM EST ----- Regarding: RE: DME cpap Order has been pulled and sent. Thanks Katharine Look   ----- Message ----- From: Brandon Melnick, RN Sent: 11/25/2016  12:52 PM To: Patsy Baltimore Subject: DME cpap                                       Increase pressure on cpap .  Thanks sandy Avonte Sensabaugh, Therapist, sports

## 2017-01-01 DIAGNOSIS — N400 Enlarged prostate without lower urinary tract symptoms: Secondary | ICD-10-CM | POA: Diagnosis not present

## 2017-01-06 ENCOUNTER — Other Ambulatory Visit: Payer: Self-pay | Admitting: Emergency Medicine

## 2017-01-08 DIAGNOSIS — R6882 Decreased libido: Secondary | ICD-10-CM | POA: Diagnosis not present

## 2017-01-08 DIAGNOSIS — C61 Malignant neoplasm of prostate: Secondary | ICD-10-CM | POA: Diagnosis not present

## 2017-01-08 DIAGNOSIS — N401 Enlarged prostate with lower urinary tract symptoms: Secondary | ICD-10-CM | POA: Diagnosis not present

## 2017-01-08 DIAGNOSIS — R3915 Urgency of urination: Secondary | ICD-10-CM | POA: Diagnosis not present

## 2017-01-08 DIAGNOSIS — N5201 Erectile dysfunction due to arterial insufficiency: Secondary | ICD-10-CM | POA: Diagnosis not present

## 2017-01-21 DIAGNOSIS — D485 Neoplasm of uncertain behavior of skin: Secondary | ICD-10-CM | POA: Diagnosis not present

## 2017-01-30 ENCOUNTER — Ambulatory Visit (INDEPENDENT_AMBULATORY_CARE_PROVIDER_SITE_OTHER): Payer: PPO | Admitting: Adult Health

## 2017-01-30 ENCOUNTER — Encounter: Payer: Self-pay | Admitting: Adult Health

## 2017-01-30 VITALS — BP 134/75 | HR 82 | Resp 16 | Wt 148.4 lb

## 2017-01-30 DIAGNOSIS — G4733 Obstructive sleep apnea (adult) (pediatric): Secondary | ICD-10-CM | POA: Diagnosis not present

## 2017-01-30 DIAGNOSIS — Z9989 Dependence on other enabling machines and devices: Secondary | ICD-10-CM | POA: Diagnosis not present

## 2017-01-30 NOTE — Progress Notes (Signed)
PATIENT: [name] DOB: 06-06-1935  REASON FOR VISIT: follow up- OSA on CPAP HISTORY FROM: patient  HISTORY OF PRESENT ILLNESS: [name] is a 82 year old male with a history of memory disturbance and obstructive sleep apnea on CPAP. He returns today for a compliance download. At the last visit his pressure was increased. His download indicates that he uses his machine 60/60 days for compliance of 100%. He uses machine greater than 4 hours 32/60 days for compliance of 53%. On average he uses his machine 4 hours and 7 minutes. He is on auto set with a minimum pressure of 6 cm water and maximum pressure of 20 cm of water with EPR 2. His average pressure is 18.7 cm/m. His residual AHI is 25.1. He returns today to discuss his download.   11/25/2016: [name] is a 82 year old male with a history of memory disturbance and obstructive sleep apnea on CPAP. He returns today for follow-up. The patient's CPAP download indicates that he uses machine 28 out of 30 days for compliance of 93%. He only uses machine greater than 4 hour 12 out of 30 days for compliance of 40%. His residual AHI is 24.8 on a minimum pressure of 6cm of water with a maximum pressure 15 cm of water. His leak in the 95th percentile is 24.9 L/m. The patient's last download was approximately 2 years ago. At that time the download showed excellent treatment of his apnea. The patient feels that his memory has remained stable. He lives at home alone. He is able to complete all ADLs independently. He operates a Teacher, music without difficulty. He is able to manage his finances and prepares all meals without difficulty. He returns today for an evaluation.  HISTORY 05/21/16:  [name] is a 82 year old male with a history of memory disorder. He returns today for follow-up. He reports that his memory has remained stable. He is currently on Aricept and tolerating it well. He states that before he started Aricept he was playing a card  game with a friend for year and never won. He states that since he has started Aricept he has won every single game. He is able to complete all ADLs independently. He lives at home alone. He states that he is sleeping well with Remeron. He is able to prepare his own meals and handle his finances without difficulty. He operates a Teacher, music without difficulty. Denies any changes with his gait or balance. Denies any recent falls. He reports that he is using the CPAP nightly. He has not had a download in over a year. He does report that he needs new supplies. I advised patient that we would try to get a wireless download. Patient returns today for an evaluation.   REVIEW OF SYSTEMS: Out of a complete 14 system review of symptoms, the patient complains only of the following symptoms, and all other reviewed systems are negative.  See history of present illness  ALLERGIES: Allergies  Allergen Reactions  . Sulfamethoxazole Other (See Comments)    Constipation  . Sulfamethoxazole-Trimethoprim     constipation    HOME MEDICATIONS: Outpatient Medications Prior to Visit  Medication Sig Dispense Refill  . allopurinol (ZYLOPRIM) 100 MG tablet TAKE 2 TABLETS BY MOUTH DAILY 180 tablet 0  . aspirin 81 MG tablet Take 81 mg by mouth daily.      . cycloSPORINE (RESTASIS) 0.05 % ophthalmic emulsion 1 drop 2 (two) times daily. As needed.     . donepezil (  ARICEPT) 10 MG tablet TAKE 1 TABLET BY MOUTH ATBEDTIME 90 tablet 3  . finasteride (PROSCAR) 5 MG tablet Take 5 mg by mouth daily.    Marland Kitchen MELATONIN PO Take by mouth at bedtime as needed.    . mirtazapine (REMERON) 45 MG tablet Take 1 tablet (45 mg total) by mouth at bedtime. 90 tablet 3  . Multiple Vitamin (MULTIVITAMIN) capsule Take 1 capsule by mouth daily.    Marland Kitchen NEXIUM 40 MG capsule TAKE 1 CAPSULE BY MOUTH DAILY BEFORE BREAKFAST. 90 capsule 6  . polyethylene glycol (MIRALAX / GLYCOLAX) packet Take 17 g by mouth daily as needed.     . valACYclovir  (VALTREX) 1000 MG tablet TAKE 1/2 TABLET BY MOUTH ONCE DAILY 45 tablet 3   No facility-administered medications prior to visit.     PAST MEDICAL HISTORY: Past Medical History:  Diagnosis Date  . Barrett's esophagus   . Cataract   . Diverticulosis of colon (without mention of hemorrhage)   . Elevated PSA   . Esophageal stricture   . GERD (gastroesophageal reflux disease)   . Glaucoma   . Gout   . Herpes   . Hiatal hernia   . History of colon cancer   . HOH (hard of hearing)   . Hyperlipidemia   . Hypersomnia, persistent 11/24/2013   Reports Epworth of 10 and higher on CPAP with  good compliance.   . Hypertrophy of prostate without urinary obstruction and other lower urinary tract symptoms (LUTS)   . Impotence of organic origin   . Insomnia   . Memory change 04/19/2013  . Prostate cancer ([place])   . Prostatitis   . Stroke ([place])    tia in  2010  . TIA (transient ischemic attack)   . Vertigo     PAST SURGICAL HISTORY: Past Surgical History:  Procedure Laterality Date  . EYE SURGERY     both cataracts  . INTRACAPSULAR CATARACT EXTRACTION     Lt eye  . MASS EXCISION N/A 07/29/2013   Procedure: MINOR EXCISION OF CHEST KELOID CONTRACTURE;  Surgeon: Theodoro Kos, DO;  Location: [facility];  Service: Plastics;  Laterality: N/A;  ACELL placement  . MINOR GRAFT APPLICATION N/A 01/17/1760   Procedure: MINOR GRAFT APPLICATION A CELL;  Surgeon: Theodoro Kos, DO;  Location: [facility];  Service: Plastics;  Laterality: N/A;  . REFRACTIVE SURGERY      FAMILY HISTORY: Family History  Problem Relation Age of Onset  . Colon cancer Neg Hx   . Cirrhosis Mother   . Jaundice Mother   . Hyperlipidemia Father   . Heart disease Father     SOCIAL HISTORY: Social History   Social History  . Marital status: Single    Spouse name: N/A  . Number of children: 2  . Years of education: 15   Occupational History  . Retired    Social History Main Topics  .  Smoking status: Former Smoker    Quit date: 07/23/1962  . Smokeless tobacco: Never Used     Comment: Stopped in 1963  . Alcohol use 8.4 oz/week    14 Standard drinks or equivalent per week     Comment: 2-3 drinks nightly  . Drug use: No  . Sexual activity: Yes     Comment: 5 partners   Other Topics Concern  . Not on file   Social History Narrative   Patient is single.   Patient has two children.   Patient is retired.  Patient has a college education.   Patient is right-handed.   Patient drinks about 3 cups of coffee daily.   Patient gets regular exercise.      PHYSICAL EXAM  Vitals:   01/30/17 0904  BP: 134/75  Pulse: 82  Resp: 16  Weight: 148 lb 6.4 oz (67.3 kg)   Body mass index is 23.95 kg/m.  Generalized: Well developed, in no acute distress   Neurological examination  Mentation: Alert oriented to time, place, history taking. Follows all commands speech and language fluent Cranial nerve II-XII: Pupils were equal round reactive to light. Extraocular movements were full, visual field were full on confrontational test. Facial sensation and strength were normal. Uvula tongue midline. Head turning and shoulder shrug  were normal and symmetric. Neck circumference 14 inches, Mallampati 2-3+ Motor: The motor testing reveals 5 over 5 strength of all 4 extremities. Good symmetric motor tone is noted throughout.  Sensory: Sensory testing is intact to soft touch on all 4 extremities. No evidence of extinction is noted.  Gait and station: Gait is normal.  Reflexes: Deep tendon reflexes are symmetric and normal bilaterally.   DIAGNOSTIC DATA (LABS, IMAGING, TESTING) - I reviewed patient records, labs, notes, testing and imaging myself where available.  Lab Results  Component Value Date   WBC 8.7 07/24/2016   HGB 15.5 07/24/2016   HCT 44.9 07/24/2016   MCV 88.4 07/24/2016   PLT 303 07/24/2016      Component Value Date/Time   NA 143 07/24/2016 1432   K 5.0 07/24/2016  1432   CL 104 07/24/2016 1432   CO2 28 07/24/2016 1432   GLUCOSE 82 07/24/2016 1432   BUN 12 07/24/2016 1432   CREATININE 0.78 07/24/2016 1432   CALCIUM 10.3 07/24/2016 1432   PROT 7.3 07/24/2016 1432   ALBUMIN 4.7 07/24/2016 1432   AST 20 07/24/2016 1432   ALT 12 07/24/2016 1432   ALKPHOS 77 07/24/2016 1432   BILITOT 0.6 07/24/2016 1432   GFRNONAA 87 04/18/2015 0826   GFRAA >89 04/18/2015 0826   Lab Results  Component Value Date   CHOL 205 (H) 04/18/2015   HDL 56 04/18/2015   LDLCALC 121 (H) 04/18/2015   TRIG 139 04/18/2015   CHOLHDL 3.7 04/18/2015   No results found for: HGBA1C Lab Results  Component Value Date   [id number] 456 07/24/2016   Lab Results  Component Value Date   TSH 1.92 07/24/2016      ASSESSMENT AND PLAN 82 y.o. year old male  has a past medical history of Barrett's esophagus; Cataract; Diverticulosis of colon (without mention of hemorrhage); Elevated PSA; Esophageal stricture; GERD (gastroesophageal reflux disease); Glaucoma; Gout; Herpes; Hiatal hernia; History of colon cancer; HOH (hard of hearing); Hyperlipidemia; Hypersomnia, persistent (11/24/2013); Hypertrophy of prostate without urinary obstruction and other lower urinary tract symptoms (LUTS); Impotence of organic origin; Insomnia; Memory change (04/19/2013); Prostate cancer ([place]); Prostatitis; Stroke [facility]); TIA (transient ischemic attack); and Vertigo. here with  1. OSA on CPAP  The patient's download continues to show a high residual apnea index. He will need to come in for repeat CPAP titration. The CPAP titration will start at 7 cm of water however once he is at 16 cm water if there is no resolution of his apnea he should be switched to BiPAP. Patient is amenable to this plan. He will follow-up after his titration.  Ward Givens, MSN, NP-C 01/30/2017, 8:02 AM [facility] [address] [place], [place] [address] [phone]

## 2017-01-30 NOTE — Patient Instructions (Signed)
Will schedule you for CPAP titration If your symptoms worsen or you develop new symptoms please let us know.

## 2017-01-30 NOTE — Progress Notes (Signed)
I agree with the assessment and plan as directed by NP .The patient is known to me . All visits dedicated to memory testing  and CPAP compliance.     Adisson Deak, MD

## 2017-02-04 DIAGNOSIS — L986 Other infiltrative disorders of the skin and subcutaneous tissue: Secondary | ICD-10-CM | POA: Diagnosis not present

## 2017-02-04 DIAGNOSIS — L308 Other specified dermatitis: Secondary | ICD-10-CM | POA: Diagnosis not present

## 2017-02-04 DIAGNOSIS — L989 Disorder of the skin and subcutaneous tissue, unspecified: Secondary | ICD-10-CM | POA: Diagnosis not present

## 2017-02-04 DIAGNOSIS — L304 Erythema intertrigo: Secondary | ICD-10-CM | POA: Diagnosis not present

## 2017-02-04 DIAGNOSIS — L258 Unspecified contact dermatitis due to other agents: Secondary | ICD-10-CM | POA: Diagnosis not present

## 2017-02-12 ENCOUNTER — Other Ambulatory Visit: Payer: Self-pay | Admitting: Emergency Medicine

## 2017-02-12 NOTE — Telephone Encounter (Signed)
07/2016 last ov 

## 2017-02-26 ENCOUNTER — Ambulatory Visit (INDEPENDENT_AMBULATORY_CARE_PROVIDER_SITE_OTHER): Payer: PPO | Admitting: Neurology

## 2017-02-26 DIAGNOSIS — Z9989 Dependence on other enabling machines and devices: Principal | ICD-10-CM

## 2017-02-26 DIAGNOSIS — G4733 Obstructive sleep apnea (adult) (pediatric): Secondary | ICD-10-CM

## 2017-03-11 ENCOUNTER — Telehealth: Payer: Self-pay | Admitting: Neurology

## 2017-03-11 DIAGNOSIS — G4733 Obstructive sleep apnea (adult) (pediatric): Secondary | ICD-10-CM

## 2017-03-11 NOTE — Procedures (Signed)
PATIENT'S NAME:  [name], [name] DOB:      12-Mar-1935      MR#:    [id number]     DATE OF RECORDING: 02/26/2017 REFERRING M.D.:  Nena Jordan, MD Study Performed:  CPAP/  BiPAP Titration Study HISTORY:  This patient returned after his CPAP download revealed a residual AHI of over 20 /hr.  The patient endorsed the Epworth Sleepiness Scale at 10 points and  weighs 148 pounds with a height of 66 (inches), resulting in a BMI of 23.7 kg/m2. The patient's neck circumference measured 14 inches.  CURRENT MEDICATIONS: Allopurinol, Aspirin, Restasis, Aricept, Proscar, Remeron, Nexium, Valtrex   PROCEDURE:  This is a multichannel digital polysomnogram utilizing the SomnoStar 11.2 system.  Electrodes and sensors were applied and monitored per AASM Specifications.   EEG, EOG, Chin and Limb EMG, were sampled at 200 Hz.  ECG, Snore and Nasal Pressure, Thermal Airflow, Respiratory Effort, CPAP Flow and Pressure, Oximetry was sampled at 50 Hz. Digital video and audio were recorded.       CPAP was initiated at 7 cmH20 with heated humidity per AASM split night standards and pressure was advanced to 16 cm water, but did not reduce the AHI . BiPAP of 16/ 12 was gradually increased to 21/17cmH20 because of hypopneas, apneas and desaturations.  At a PAP pressure of 21/ 17 cmH20, there was a reduction of the AHI to 0.0 with improvement of the above symptoms of obstructive sleep apnea.  Another acceptable and tolerated pressure was 18/14 cm water.    Lights Out was at 20:32 and Lights On at 05:02. Total recording time (TRT) was 510 minutes, with a total sleep time (TST) of 390.5 minutes. The patient's sleep latency was 26 minutes with 0 minutes of wake time after sleep onset. REM latency was 124 minutes.  The sleep efficiency was 76.6 %.    SLEEP ARCHITECTURE: WASO (Wake after sleep onset) was 101 minutes.  There were 63 minutes in Stage N1, 212 minutes Stage N2, 67.5 minutes Stage N3 and 48 minutes in Stage REM.  The  percentage of Stage N1 was 16.1%, Stage N2 was 54.3%, Stage N3 was 17.3% and Stage R (REM sleep) was 12.3%.   RESPIRATORY ANALYSIS:  There were 11 respiratory events: 0 apneas and 11 hypopneas with 6 respiratory event related arousals (RERAs).     The total APNEA/HYPOPNEA INDEX  (AHI) was 1.7 /hour and the total RESPIRATORY DISTURBANCE INDEX was 2.6 .hour  0 events occurred in REM sleep and 11 events in NREM. The REM AHI was 0 /hour versus a non-REM AHI of 1.9 /hour.  The patient spent 390.5 minutes of total sleep time in the supine position and 0 minutes in non-supine. The supine AHI was 1.7, versus a non-supine AHI of 0.0.  OXYGEN SATURATION & C02:  The baseline 02 saturation was 96%, with the lowest being 91%. Time spent below 89% saturation equaled 0 minutes.  PERIODIC LIMB MOVEMENTS:   The patient had a total of 0 Periodic Limb Movements. The Periodic Limb Movement (PLM) index was 0 and the PLM Arousal index was 0 /hour. The arousals were noted as: 87 were spontaneous, 0 were associated with PLMs, and 11 were associated with respiratory events. Audio and video analysis did not show any abnormal or unusual movements, behaviors, phonations or vocalizations.  The patient took no bathroom breaks.  EKG was in keeping with normal sinus rhythm (NSR).   Loud Snoring was noted. Large oral air leaks may have caused  erroneous high AHI report on download.  The patient was fitted with a FFM, a ResMed Air Touch medium apparatus.  DIAGNOSIS 1. Obstructive Sleep Apnea, poorly responsive to nasal mask and pillow, creating a large oral air leak. The patient responded well to a FFM and BiPAP modalities     PLANS/RECOMMENDATIONS: Mr. Brosseau III will use a BiPAP setting of 16/12 with a FFM. I will follow his download in 30 days and will change settings if needed. A medium sized Airtouch FFM with memory foam was used.   A follow up appointment will be scheduled in the Sleep Clinic at [facility].   Please call [phone] with any questions.      I certify that I have reviewed the entire raw data recording prior to the issuance of this report in accordance with the Standards of Accreditation of the American Academy of Sleep Medicine (AASM)      Larey Seat, M.D.  03-11-2017  Diplomat, American Board of Psychiatry and Neurology  Diplomat, [place] of [facility] Medical Director, [place] [facility]

## 2017-03-11 NOTE — Telephone Encounter (Signed)
Result note did not load after several refresh attempts.  I placed the BiPAP order in a phone order. [name] will use a BiPAP setting of 16/12 with a FFM. I will follow his download in 30 days and will change settings if needed. A medium sized Airtouch FFM with memory foam was used.

## 2017-03-13 NOTE — Telephone Encounter (Signed)
I called pt. I advised pt that Dr. [name] Fairy reviewed their sleep study results and found that pt did well with the bipap during his titration study. Dr. [name] Fairy recommends that pt start a bipap at home. I reviewed PAP compliance expectations with the pt. Pt is agreeable to starting a CPAP. I advised pt that an order will be sent to a DME, [place], and [facility] will call the pt within about one week after they file with the pt's insurance. [facility] will show the pt how to use the machine, fit for masks, and troubleshoot the CPAP if needed. A follow up appt was made for insurance purposes with Dr. [name] Fairy on Thursday, August 30th, 2018. Pt verbalized understanding to arrive 15 minutes early and bring their CPAP. A letter with all of this information in it will be mailed to the pt as a reminder. I verified with the pt that the address we have on file is correct. Pt verbalized understanding of results. Pt had no questions at this time but was encouraged to call back if questions arise.

## 2017-03-19 DIAGNOSIS — G4733 Obstructive sleep apnea (adult) (pediatric): Secondary | ICD-10-CM | POA: Diagnosis not present

## 2017-03-20 DIAGNOSIS — H16223 Keratoconjunctivitis sicca, not specified as Sjogren's, bilateral: Secondary | ICD-10-CM | POA: Diagnosis not present

## 2017-03-20 DIAGNOSIS — Z961 Presence of intraocular lens: Secondary | ICD-10-CM | POA: Diagnosis not present

## 2017-03-20 DIAGNOSIS — H40053 Ocular hypertension, bilateral: Secondary | ICD-10-CM | POA: Diagnosis not present

## 2017-04-19 DIAGNOSIS — G4733 Obstructive sleep apnea (adult) (pediatric): Secondary | ICD-10-CM | POA: Diagnosis not present

## 2017-04-24 DIAGNOSIS — G4733 Obstructive sleep apnea (adult) (pediatric): Secondary | ICD-10-CM | POA: Diagnosis not present

## 2017-05-01 ENCOUNTER — Ambulatory Visit (INDEPENDENT_AMBULATORY_CARE_PROVIDER_SITE_OTHER): Payer: PPO | Admitting: Nurse Practitioner

## 2017-05-01 ENCOUNTER — Encounter: Payer: Self-pay | Admitting: Nurse Practitioner

## 2017-05-01 VITALS — BP 151/84 | HR 71 | Ht 65.0 in | Wt 144.0 lb

## 2017-05-01 DIAGNOSIS — G4733 Obstructive sleep apnea (adult) (pediatric): Secondary | ICD-10-CM | POA: Diagnosis not present

## 2017-05-01 DIAGNOSIS — Z961 Presence of intraocular lens: Secondary | ICD-10-CM | POA: Diagnosis not present

## 2017-05-01 DIAGNOSIS — H16223 Keratoconjunctivitis sicca, not specified as Sjogren's, bilateral: Secondary | ICD-10-CM | POA: Diagnosis not present

## 2017-05-01 DIAGNOSIS — H40053 Ocular hypertension, bilateral: Secondary | ICD-10-CM | POA: Diagnosis not present

## 2017-05-01 NOTE — Patient Instructions (Signed)
BiPAP compliance is good but still with obstructive apneas 17.2 Will increase BiPAP pressure 17/13 Patient to follow-up in 3 months for repeat compliance

## 2017-05-01 NOTE — Progress Notes (Signed)
[facility]  PATIENT: Jaivyn Gulla Tsosie III DOB: 06-08-1935   REASON FOR VISIT: Follow-up obstructive sleep apnea on BiPAP HISTORY FROM: Patient    HISTORY OF PRESENT ILLNESS:UPDATE 6/28/ 2018CM Mr. Ybarbo, 82 year old male returns for follow-up with history of obstructive sleep apnea. He was on CPAP when last seen he was switched to BiPAP due to high residual apnea index. He feels that he is doing well on BiPAP Compliance data reviewed today for 30 days from 03/31/2017 2 04/29/2017 100% compliance for 30 days greater than 4 hours. Average usage 7 hours 14 minutes. Pressure set 16 cm/12 cm of pressure. AHI 21.3. Central apneas 0.3. Obstructive apneas 17.2. He returns to discuss this download 01/30/17 MMMr. Ace is a 82 year old male with a history of memory disturbance and obstructive sleep apnea on CPAP. He returns today for a compliance download. At the last visit his pressure was increased. His download indicates that he uses his machine 60/60 days for compliance of 100%. He uses machine greater than 4 hours 32/60 days for compliance of 53%. On average he uses his machine 4 hours and 7 minutes. He is on auto set with a minimum pressure of 6 cm water and maximum pressure of 20 cm of water with EPR 2. His average pressure is 18.7 cm/m. His residual AHI is 25.1. He returns today to discuss his download.   11/25/2016: Mr. Fagin is a 82 year old male with a history of memory disturbance and obstructive sleep apnea on CPAP. He returns today for follow-up. The patient's CPAP download indicates that he uses machine 28 out of 30 days for compliance of 93%. He only uses machine greater than 4 hour 12 out of 30 days for compliance of 40%. His residual AHI is 24.8 on a minimum pressure of 6cm of water with a maximum pressure 15 cmof water. His leak in the 95th percentile is 24.9 L/m. The patient's last download was approximately 2 years ago. At that time the download showed excellent  treatment of his apnea. The patient feels that his memory has remained stable. He livesat home alone. He is able to complete all ADLs independently. He operates a Teacher, music without difficulty. He is able to manage his finances and prepares all meals without difficulty. He returns today for an evaluation.   REVIEW OF SYSTEMS: Full 14 system review of systems performed and notable only for those listed, all others are neg:  Constitutional: neg  Cardiovascular: neg Ear/Nose/Throat: neg  Skin: neg Eyes: neg Respiratory: neg Gastroitestinal: neg  Hematology/Lymphatic: neg  Endocrine: neg Musculoskeletal:neg Allergy/Immunology: neg Neurological: neg Psychiatric: neg Sleep : Obstructive sleep apnea with BiPAP   ALLERGIES: Allergies  Allergen Reactions  . Sulfamethoxazole Other (See Comments)    Constipation  . Sulfamethoxazole-Trimethoprim     constipation    HOME MEDICATIONS: Outpatient Medications Prior to Visit  Medication Sig Dispense Refill  . allopurinol (ZYLOPRIM) 100 MG tablet TAKE 2 TABLETS BY MOUTH DAILY 180 tablet 0  . aspirin 81 MG tablet Take 81 mg by mouth daily.      . cycloSPORINE (RESTASIS) 0.05 % ophthalmic emulsion 1 drop 2 (two) times daily. As needed.     . donepezil (ARICEPT) 10 MG tablet TAKE 1 TABLET BY MOUTH ATBEDTIME 90 tablet 3  . finasteride (PROSCAR) 5 MG tablet Take 5 mg by mouth daily.    Marland Kitchen MELATONIN PO Take by mouth at bedtime as needed.    . mirtazapine (REMERON) 45 MG tablet Take 1 tablet (45 mg  total) by mouth at bedtime. 90 tablet 3  . Multiple Vitamin (MULTIVITAMIN) capsule Take 1 capsule by mouth daily.    Marland Kitchen NEXIUM 40 MG capsule TAKE 1 CAPSULE BY MOUTH DAILY BEFORE BREAKFAST. 90 capsule 6  . polyethylene glycol (MIRALAX / GLYCOLAX) packet Take 17 g by mouth daily as needed.     . valACYclovir (VALTREX) 1000 MG tablet TAKE 1/2 TABLET BY MOUTH ONCE DAILY 45 tablet 3   No facility-administered medications prior to visit.     PAST MEDICAL  HISTORY: Past Medical History:  Diagnosis Date  . Barrett's esophagus   . Cataract   . Diverticulosis of colon (without mention of hemorrhage)   . Elevated PSA   . Esophageal stricture   . GERD (gastroesophageal reflux disease)   . Glaucoma   . Gout   . Herpes   . Hiatal hernia   . History of colon cancer   . HOH (hard of hearing)   . Hyperlipidemia   . Hypersomnia, persistent 11/24/2013   Reports Epworth of 10 and higher on CPAP with  good compliance.   . Hypertrophy of prostate without urinary obstruction and other lower urinary tract symptoms (LUTS)   . Impotence of organic origin   . Insomnia   . Memory change 04/19/2013  . OSA on CPAP   . Prostate cancer ([place])   . Prostatitis   . Stroke ([place])    tia in  2010  . TIA (transient ischemic attack)   . Vertigo     PAST SURGICAL HISTORY: Past Surgical History:  Procedure Laterality Date  . EYE SURGERY     both cataracts  . INTRACAPSULAR CATARACT EXTRACTION     Lt eye  . MASS EXCISION N/A 07/29/2013   Procedure: MINOR EXCISION OF CHEST KELOID CONTRACTURE;  Surgeon: Theodoro Kos, DO;  Location: [facility];  Service: Plastics;  Laterality: N/A;  ACELL placement  . MINOR GRAFT APPLICATION N/A 7/61/9509   Procedure: MINOR GRAFT APPLICATION A CELL;  Surgeon: Theodoro Kos, DO;  Location: [facility];  Service: Plastics;  Laterality: N/A;  . REFRACTIVE SURGERY      FAMILY HISTORY: Family History  Problem Relation Age of Onset  . Cirrhosis Mother   . Jaundice Mother   . Hyperlipidemia Father   . Heart disease Father   . Colon cancer Neg Hx     SOCIAL HISTORY: Social History   Social History  . Marital status: Single    Spouse name: N/A  . Number of children: 2  . Years of education: 15   Occupational History  . Retired    Social History Main Topics  . Smoking status: Former Smoker    Quit date: 07/23/1962  . Smokeless tobacco: Never Used     Comment: Stopped in 1963  . Alcohol  use 8.4 oz/week    14 Standard drinks or equivalent per week     Comment: 2-3 drinks nightly  . Drug use: No  . Sexual activity: Yes     Comment: 5 partners   Other Topics Concern  . Not on file   Social History Narrative   Patient is single.   Patient has two children.   Patient is retired.   Patient has a college education.   Patient is right-handed.   Patient drinks about 3 cups of coffee daily.   Patient gets regular exercise.     PHYSICAL EXAM  Vitals:   05/01/17 0738  BP: (!) 151/84  Pulse: 71  Weight: 144 lb (65.3 kg)  Height: 5\' 5"  (1.651 m)   Body mass index is 23.96 kg/m.  Generalized: Well developed, in no acute distress  Head: normocephalic and atraumatic,. Oropharynx benign mallopatti 2-3 Neck: Supple, 14 inches Musculoskeletal: No deformity   Neurological examination   Mentation: Alert oriented to time, place, history taking. Attention span and concentration appropriate. Recent and remote memory intact.  Follows all commands speech and language fluent.   Cranial nerve II-XII: .Pupils were equal round reactive to light extraocular movements were full, visual field were full on confrontational test. Facial sensation and strength were normal. hearing was intact to finger rubbing bilaterally. Uvula tongue midline. head turning and shoulder shrug were normal and symmetric.Tongue protrusion into cheek strength was normal. Motor: normal bulk and tone, full strength in the BUE, BLE,  Sensory: normal and symmetric to light touch,  Coordination: finger-nose-finger, heel-to-shin bilaterally, no dysmetria Reflexes: Symmetric upper and lower plantar responses were flexor bilaterally. Gait and Station: Rising up from seated position without assistance, normal stance,  moderate stride, good arm swing, smooth turning, able to perform tiptoe, and heel walking without difficulty. Tandem gait is steady  DIAGNOSTIC DATA (LABS, IMAGING, TESTING) - I reviewed patient  records, labs, notes, testing and imaging myself where available.  Lab Results  Component Value Date   WBC 8.7 07/24/2016   HGB 15.5 07/24/2016   HCT 44.9 07/24/2016   MCV 88.4 07/24/2016   PLT 303 07/24/2016      Component Value Date/Time   NA 143 07/24/2016 1432   K 5.0 07/24/2016 1432   CL 104 07/24/2016 1432   CO2 28 07/24/2016 1432   GLUCOSE 82 07/24/2016 1432   BUN 12 07/24/2016 1432   CREATININE 0.78 07/24/2016 1432   CALCIUM 10.3 07/24/2016 1432   PROT 7.3 07/24/2016 1432   ALBUMIN 4.7 07/24/2016 1432   AST 20 07/24/2016 1432   ALT 12 07/24/2016 1432   ALKPHOS 77 07/24/2016 1432   BILITOT 0.6 07/24/2016 1432   GFRNONAA 87 04/18/2015 0826   GFRAA >89 04/18/2015 0826   Lab Results  Component Value Date   CHOL 205 (H) 04/18/2015   HDL 56 04/18/2015   LDLCALC 121 (H) 04/18/2015   TRIG 139 04/18/2015   CHOLHDL 3.7 04/18/2015   No results found for: HGBA1C Lab Results  Component Value Date   JQBHALPF79 024 07/24/2016   Lab Results  Component Value Date   TSH 1.92 07/24/2016      ASSESSMENT AND PLAN  82 y.o. year old male  has a past medical history Obstructive sleep apnea on BiPAP.BiPAP Compliance data reviewed today for 30 days from 03/31/2017 2 04/29/2017 100% compliance for 30 days greater than 4 hours. Average usage 7 hours 14 minutes. Pressure set 16 cm/12 cm of pressure. AHI 21.3. Central apneas 0.3. Obstructive apneas 17.2.  PLAN: Discussed with Dr. [name] Fairy BiPAP compliance is good but still with obstructive apneas 17.2 Will increase BiPAP pressure 17/13 Patient to follow-up in 3 months for repeat compliance Dennie Bible, [facility], [facility], APRN  [facility] [address], [place], [place] [address] [phone]

## 2017-05-01 NOTE — Progress Notes (Signed)
I agree with the assessment and plan as directed by NP . I discussed the downloaded CPAP data with the NP. This patient is known to me . I recommended an adjustment in Bi{AP pressure.    Ezme Duch, MD

## 2017-05-19 DIAGNOSIS — G4733 Obstructive sleep apnea (adult) (pediatric): Secondary | ICD-10-CM | POA: Diagnosis not present

## 2017-06-02 ENCOUNTER — Other Ambulatory Visit: Payer: Self-pay | Admitting: Adult Health

## 2017-06-19 DIAGNOSIS — G4733 Obstructive sleep apnea (adult) (pediatric): Secondary | ICD-10-CM | POA: Diagnosis not present

## 2017-07-03 ENCOUNTER — Ambulatory Visit: Payer: Self-pay | Admitting: Neurology

## 2017-07-20 DIAGNOSIS — G4733 Obstructive sleep apnea (adult) (pediatric): Secondary | ICD-10-CM | POA: Diagnosis not present

## 2017-07-28 ENCOUNTER — Ambulatory Visit (INDEPENDENT_AMBULATORY_CARE_PROVIDER_SITE_OTHER): Payer: PPO | Admitting: Family Medicine

## 2017-07-28 ENCOUNTER — Encounter: Payer: Self-pay | Admitting: Family Medicine

## 2017-07-28 VITALS — BP 130/72 | HR 73 | Temp 98.1°F | Resp 18 | Ht 65.0 in | Wt 139.4 lb

## 2017-07-28 DIAGNOSIS — G4733 Obstructive sleep apnea (adult) (pediatric): Secondary | ICD-10-CM | POA: Diagnosis not present

## 2017-07-28 DIAGNOSIS — L723 Sebaceous cyst: Secondary | ICD-10-CM | POA: Diagnosis not present

## 2017-07-28 DIAGNOSIS — Z5181 Encounter for therapeutic drug level monitoring: Secondary | ICD-10-CM | POA: Diagnosis not present

## 2017-07-28 DIAGNOSIS — Z0000 Encounter for general adult medical examination without abnormal findings: Secondary | ICD-10-CM

## 2017-07-28 DIAGNOSIS — Z23 Encounter for immunization: Secondary | ICD-10-CM | POA: Diagnosis not present

## 2017-07-28 DIAGNOSIS — R490 Dysphonia: Secondary | ICD-10-CM

## 2017-07-28 DIAGNOSIS — M1A00X1 Idiopathic chronic gout, unspecified site, with tophus (tophi): Secondary | ICD-10-CM

## 2017-07-28 DIAGNOSIS — R634 Abnormal weight loss: Secondary | ICD-10-CM | POA: Diagnosis not present

## 2017-07-28 DIAGNOSIS — E559 Vitamin D deficiency, unspecified: Secondary | ICD-10-CM

## 2017-07-28 LAB — POCT URINALYSIS DIP (MANUAL ENTRY)
Bilirubin, UA: NEGATIVE
GLUCOSE UA: NEGATIVE mg/dL
Ketones, POC UA: NEGATIVE mg/dL
LEUKOCYTES UA: NEGATIVE
NITRITE UA: NEGATIVE
PH UA: 7 (ref 5.0–8.0)
Protein Ur, POC: NEGATIVE mg/dL
RBC UA: NEGATIVE
Spec Grav, UA: 1.02 (ref 1.010–1.025)
UROBILINOGEN UA: 0.2 U/dL

## 2017-07-28 MED ORDER — FLUTICASONE PROPIONATE 50 MCG/ACT NA SUSP: 2.0000 | Freq: Every day | NASAL | 2 refills | Status: DC

## 2017-07-28 NOTE — Progress Notes (Signed)
Subjective:    Patient ID: Demetra Shiner Mcdonnell III, male    DOB: 1935/09/23, 82 y.o.   MRN: [id number]  HPI   Primary Preventative Screenings: Prostate Cancer:  STI screening: Colorectal Cancer: Tobacco use/AAA/Lung/EtOH/Illicit substances:  Cardiac: Weight/Blood sugar/Diet/Exercise:  OTC/Vit/Supp/Herbal: asa 81, probioptics, mvi.  Dentist/Optho: Goes every 6 mos to see Dr. Katy Fitch every 6 mos, no dental needs as no teeth Immunizations: Zostavax done 4 yrs ago at [facility]. - willing to do Shingrix in another yr or 2.  Immunization History  Administered Date(s) Administered  . Influenza, Seasonal, Injecte, Preservative Fre 10/14/2012  . Influenza,inj,Quad PF,6+ Mos 09/15/2015, 07/24/2016, 07/28/2017  . Influenza-Unspecified 10/04/2013  . Pneumococcal Conjugate-13 04/18/2015  . Pneumococcal Polysaccharide-23 04/06/2013  . Td 02/03/2008     Not fasting so will get lipid panel next visit [place] is urinology Using probiotics rather than nexium - working. Was on ppi x 3 yrs but no change in hoarseness with this Still a little horse A ENT Dr. Janace Hoard said nml.   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 Migdalia Dk - vitiligo, keloid  Flu shot today  No gout flairs - could wean off allopurinol but will leave alone.  No hsv flairs - no outbreak x 4 years Does have a scale at home. Increased his vit D to 1000 since last year.  Uric acid    Occ dizziness, occ tinintus but not ocnstant or associated.  No HCPOA Left ear deaf - right nml - has hearing ad but doesn wear - feels that hearing might be a little better.  Injection in left TM, right nml.   No libido tfor 18 mos.  AAA for [place] Lipids at next visit  Chronic Medical Conditions:  .   Unitentional weight loss this past 6 mos. No sweats, appetite nml, has OSA.  Had dose increased on remeron from 30 to 45mg  about 6 mos piror. Due to poor sleepw hich did initially help until last mo. - now will  only sleep 3-4 hrs, wake uup to urinate then has a hard time falling back to sleep. Normal bowels. Wt Readings from Last 3 Encounters:  07/28/17 139 lb 6.4 oz (63.2 kg)  05/01/17 144 lb (65.3 kg)  01/30/17 148 lb 6.4 oz (67.3 kg)    2.   OSA: Has been on CPAP since 2014.  Has changed to bipap  C/o hoarseness worse in the mroning - did see Dr. Janace Hoard in ENT sev yrs ago for this who said it was due to gerd.  Went off nexium and onto probiotics which work much better - was having side effects to nexium though he doesn't remember what and worried about long term effects. Bowels No allergic rhinitis/pnd,.  Now ater brash in a.m. No cough.  Thinks he re  Urology - is going to call because of new nocturia of 1x/night - seen Dr. Diona Fanti this yr who checked prostate and nml - no evidence of cancer - bx last yr  Sleeping well  Started going to the [facility] 3 mos ago - 1/2 mile walk & some of the aerobic machines.  Not watching diet - eats whatever he wants - 2.5-3 meals/d - doesn't do much sugar or bread but otherwise doesn't watch w/ no many fruts/veggies but on mvi.  Review of Systems     Objective:   Physical Exam    BP 130/72   Pulse 73   Temp 98.1 F (36.7 C) (Oral)  Resp 18   Ht 5\' 5"  (1.651 m)   Wt 139 lb 6.4 oz (63.2 kg)   SpO2 95%   BMI 23.20 kg/m      Assessment & Plan:  Weight loss nocutria Vit D def  1. Annual physical exam   2. Need for prophylactic vaccination and inoculation against influenza   3. Loss of weight   4. Sebaceous cyst   5. OSA (obstructive sleep apnea)   6. Hoarseness of voice   7. Vitamin D deficiency   8. Idiopathic chronic gout with tophus, unspecified site   9. Medication monitoring encounter     Orders Placed This Encounter  Procedures  . Flu Vaccine QUAD 36+ mos IM  . CBC with Differential/Platelet  . Comprehensive metabolic panel    Order Specific Question:   Has the patient fasted?    Answer:   Yes  . TSH  . Lipid panel     Order Specific Question:   Has the patient fasted?    Answer:   Yes  . VITAMIN D 25 Hydroxy (Vit-D Deficiency, Fractures)  . Uric acid  . Care order/instruction:    AVS printed - let patient go!  Marland Kitchen POCT urinalysis dipstick    Meds ordered this encounter  Medications  . fluticasone (FLONASE) 50 MCG/ACT nasal spray    Sig: Place 2 sprays into both nostrils at bedtime.    Dispense:  16 g    Refill:  2     Delman Cheadle, M.D.  Primary Care at [facility] [address] [place], [place] [address] [phone] phone [phone] fax  07/30/17 10:53 PM

## 2017-07-28 NOTE — Patient Instructions (Addendum)
Every night before bed. Place 2 sprays into each side of the nose and remember 2. the tip of the bottle towards your ears rather than straight back into your head work towards the nasal septum. Gently sniff in while spraying the bottle. If after one month, the hoarseness has gotten significantly better for the last week or 2, then it is do to some subclinical postnasal drip and you can continue on the nasal spray. If the hoarseness has not gotten any better, then stop the fluticasone nasal spray and restart the Nexium - or you could use one of its sister medications such as Prilosec, Protonix, or AcipHex. Take this 30 minutes before dinner every day for a month. If the hoarseness resolves within the last week or 2, then it is due to laryngeal reflux and you can decide whether you would like to continue on the medication or not. If the hoarseness does not improve with either intervention, then we should likely have you return to Dr. Janace Hoard for a recheck.    IF you received an x-ray today, you will receive an invoice from [facility]. Please contact [facility] at [phone] with questions or concerns regarding your invoice.   IF you received labwork today, you will receive an invoice from [facility]. Please contact [facility] at [phone] with questions or concerns regarding your invoice.   Our billing staff will not be able to assist you with questions regarding bills from these companies.  You will be contacted with the lab results as soon as they are available. The fastest way to get your results is to activate your My Chart account. Instructions are located on the last page of this paperwork. If you have not heard from [place] regarding the results in 2 weeks, please contact this office.      Hoarseness Hoarseness is any abnormal change in your voice.Hoarseness can make it difficult to speak. Your voice may sound raspy, breathy, or strained. Hoarseness is caused by a problem with  the vocal cords. The vocal cords are two bands of tissue inside your voice box (larynx). When you speak, your vocal cords move back and forth to create sound. The surfaces of your vocal cords need to be smooth for your voice to sound clear. Swelling or lumps on the vocal cords can cause hoarseness. Common causes of vocal cord problems include:  Upper airway infection.  A long-term cough.  Straining or overusing your voice.  Smoking.  Allergies.  Vocal cord growths.  Stomach acids that flow up from your stomach and irritate your vocal cords (gastroesophageal reflux).  Follow these instructions at home: Watch your condition for any changes. To ease any discomfort that you feel:  Rest your voice. Do not whisper. Whispering can cause muscle strain.  Do not speak in a loud or harsh voice that makes your hoarseness worse.  Do not use any tobacco products, including cigarettes, chewing tobacco, or electronic cigarettes. If you need help quitting, ask your health care provider.  Avoid secondhand smoke.  Do not eat foods that give you heartburn. Heartburn can make gastroesophageal reflux worse.  Do not drink coffee.  Do not drink alcohol.  Drink enough fluids to keep your urine clear or pale yellow.  Use a humidifier if the air in your home is dry.  Contact a health care provider if:  You have hoarseness that lasts longer than 3 weeks.  You almost lose or completelylose your voice for longer than 3 days.  You have pain when  you swallow or try to talk.  You feel a lump in your neck. Get help right away if:  You have trouble swallowing.  You feel as though you are choking when you swallow.  You cough up blood or vomit blood.  You have trouble breathing. This information is not intended to replace advice given to you by your health care provider. Make sure you discuss any questions you have with your health care provider. Document Released: 10/04/2005 Document Revised:  03/28/2016 Document Reviewed: 10/12/2014 Elsevier Interactive Patient Education  [employer].

## 2017-07-29 DIAGNOSIS — G4733 Obstructive sleep apnea (adult) (pediatric): Secondary | ICD-10-CM | POA: Diagnosis not present

## 2017-07-29 LAB — COMPREHENSIVE METABOLIC PANEL
ALK PHOS: 86 IU/L (ref 39–117)
ALT: 11 IU/L (ref 0–44)
AST: 17 IU/L (ref 0–40)
Albumin/Globulin Ratio: 2 (ref 1.2–2.2)
Albumin: 4.7 g/dL (ref 3.5–4.7)
BUN/Creatinine Ratio: 15 (ref 10–24)
BUN: 14 mg/dL (ref 8–27)
Bilirubin Total: 0.5 mg/dL (ref 0.0–1.2)
CALCIUM: 10.4 mg/dL — AB (ref 8.6–10.2)
CO2: 25 mmol/L (ref 20–29)
CREATININE: 0.91 mg/dL (ref 0.76–1.27)
Chloride: 102 mmol/L (ref 96–106)
GFR calc Af Amer: 92 mL/min/{1.73_m2} (ref >59)
GFR, EST NON AFRICAN AMERICAN: 79 mL/min/{1.73_m2} (ref >59)
GLUCOSE: 87 mg/dL (ref 65–99)
Globulin, Total: 2.3 g/dL (ref 1.5–4.5)
Potassium: 4.3 mmol/L (ref 3.5–5.2)
Sodium: 142 mmol/L (ref 134–144)
Total Protein: 7 g/dL (ref 6.0–8.5)

## 2017-07-29 LAB — CBC WITH DIFFERENTIAL/PLATELET
BASOS ABS: 0 10*3/uL (ref 0.0–0.2)
Basos: 1 %
EOS (ABSOLUTE): 0.6 10*3/uL — ABNORMAL HIGH (ref 0.0–0.4)
EOS: 9 %
HEMOGLOBIN: 15.5 g/dL (ref 13.0–17.7)
Hematocrit: 46.1 % (ref 37.5–51.0)
IMMATURE GRANULOCYTES: 0 %
Immature Grans (Abs): 0 10*3/uL (ref 0.0–0.1)
LYMPHS ABS: 1.5 10*3/uL (ref 0.7–3.1)
Lymphs: 23 %
MCH: 29.9 pg (ref 26.6–33.0)
MCHC: 33.6 g/dL (ref 31.5–35.7)
MCV: 89 fL (ref 79–97)
MONOCYTES: 10 %
Monocytes Absolute: 0.6 10*3/uL (ref 0.1–0.9)
NEUTROS PCT: 57 %
Neutrophils Absolute: 3.6 10*3/uL (ref 1.4–7.0)
Platelets: 310 10*3/uL (ref 150–379)
RBC: 5.18 x10E6/uL (ref 4.14–5.80)
RDW: 14.1 % (ref 12.3–15.4)
WBC: 6.3 10*3/uL (ref 3.4–10.8)

## 2017-07-29 LAB — URIC ACID: Uric Acid: 5.1 mg/dL (ref 3.7–8.6)

## 2017-07-29 LAB — LIPID PANEL
CHOLESTEROL TOTAL: 194 mg/dL (ref 100–199)
Chol/HDL Ratio: 3.6 ratio (ref 0.0–5.0)
HDL: 54 mg/dL (ref >39)
LDL CALC: 120 mg/dL — AB (ref 0–99)
TRIGLYCERIDES: 98 mg/dL (ref 0–149)
VLDL CHOLESTEROL CAL: 20 mg/dL (ref 5–40)

## 2017-07-29 LAB — TSH: TSH: 2.34 u[IU]/mL (ref 0.450–4.500)

## 2017-07-29 LAB — VITAMIN D 25 HYDROXY (VIT D DEFICIENCY, FRACTURES): Vit D, 25-Hydroxy: 36.3 ng/mL (ref 30.0–100.0)

## 2017-07-31 NOTE — Progress Notes (Signed)
[facility]  PATIENT: [name] DOB: [birth year]   REASON FOR VISIT: Follow-up obstructive sleep apnea on BiPAP HISTORY FROM: Patient    HISTORY OF PRESENT ILLNESS:UPDATE 9/28 /2018CM [name], 82 year old male returns for follow-up with history of obstructive sleep apnea on BiPAP. He feels he is doing well however compliance data actually worse than 3 months ago. Data dated 07/02/2017 through 9/27/ 2018 Shows 90% compliance greater than 4 hours. Average usage 6 hours 18 minutes. Pressure set 13-17 cm. AHI 32.7 central apneas 0.3. Obstructive apneas 29.2. Patient also feels he has a Leak however this is not evident on the report.Marland Kitchen He is wondering if his Remeron can be increased. He returns for reevaluation    UPDATE 6/28/ 2018[name], 82 year old male returns for follow-up with history of obstructive sleep apnea. He was on CPAP when last seen he was switched to BiPAP due to high residual apnea index. He feels that he is doing well on BiPAP Compliance data reviewed today for 30 days from 03/31/2017 2 04/29/2017 100% compliance for 30 days greater than 4 hours. Average usage 7 hours 14 minutes. Pressure set 16 cm/12 cm of pressure. AHI 21.3. Central apneas 0.3. Obstructive apneas 17.2. He returns to discuss this download 01/30/17 MMMr. Massey is a 82 year old male with a history of memory disturbance and obstructive sleep apnea on CPAP. He returns today for a compliance download. At the last visit his pressure was increased. His download indicates that he uses his machine 60/60 days for compliance of 100%. He uses machine greater than 4 hours 32/60 days for compliance of 53%. On average he uses his machine 4 hours and 7 minutes. He is on auto set with a minimum pressure of 6 cm water and maximum pressure of 20 cm of water with EPR 2. His average pressure is 18.7 cm/m. His residual AHI is 25.1. He returns today to discuss his download.   11/25/2016: [name]  is a 82 year old male with a history of memory disturbance and obstructive sleep apnea on CPAP. He returns today for follow-up. The patient's CPAP download indicates that he uses machine 28 out of 30 days for compliance of 93%. He only uses machine greater than 4 hour 12 out of 30 days for compliance of 40%. His residual AHI is 24.8 on a minimum pressure of 6cm of water with a maximum pressure 15 cmof water. His leak in the 95th percentile is 24.9 L/m. The patient's last download was approximately 2 years ago. At that time the download showed excellent treatment of his apnea. The patient feels that his memory has remained stable. He livesat home alone. He is able to complete all ADLs independently. He operates a Teacher, music without difficulty. He is able to manage his finances and prepares all meals without difficulty. He returns today for an evaluation.   REVIEW OF SYSTEMS: Full 14 system review of systems performed and notable only for those listed, all others are neg:  Constitutional: neg  Cardiovascular: neg Ear/Nose/Throat: neg  Skin: neg Eyes: neg Respiratory: neg Gastroitestinal: neg  Hematology/Lymphatic: neg  Endocrine: neg Musculoskeletal:neg Allergy/Immunology: neg Neurological: neg Psychiatric: neg Sleep : Obstructive sleep apnea with BiPAP   ALLERGIES: Allergies  Allergen Reactions  . Sulfamethoxazole Other (See Comments)    Constipation  . Sulfamethoxazole-Trimethoprim     constipation    HOME MEDICATIONS: Outpatient Medications Prior to Visit  Medication Sig Dispense Refill  . allopurinol (ZYLOPRIM) 100 MG tablet TAKE 2 TABLETS BY MOUTH DAILY  180 tablet 0  . aspirin 81 MG tablet Take 81 mg by mouth daily.      . cycloSPORINE (RESTASIS) 0.05 % ophthalmic emulsion 1 drop 2 (two) times daily. As needed.     . donepezil (ARICEPT) 10 MG tablet TAKE 1 TABLET BY MOUTH ATBEDTIME 90 tablet 3  . finasteride (PROSCAR) 5 MG tablet Take 5 mg by mouth daily.    . fluticasone  (FLONASE) 50 MCG/ACT nasal spray Place 2 sprays into both nostrils at bedtime. 16 g 2  . mirtazapine (REMERON) 45 MG tablet TAKE 1 TABLET (45 MG TOTAL) BY MOUTH AT BEDTIME. 90 tablet 2  . Multiple Vitamin (MULTIVITAMIN) capsule Take 1 capsule by mouth daily.    Marland Kitchen NEXIUM 40 MG capsule TAKE 1 CAPSULE BY MOUTH DAILY BEFORE BREAKFAST. 90 capsule 6  . polyethylene glycol (MIRALAX / GLYCOLAX) packet Take 17 g by mouth daily as needed.     . valACYclovir (VALTREX) 1000 MG tablet TAKE 1/2 TABLET BY MOUTH ONCE DAILY 45 tablet 3   No facility-administered medications prior to visit.     PAST MEDICAL HISTORY: Past Medical History:  Diagnosis Date  . Barrett's esophagus   . Cataract   . Diverticulosis of colon (without mention of hemorrhage)   . Elevated PSA   . Esophageal stricture   . GERD (gastroesophageal reflux disease)   . Glaucoma   . Gout   . Herpes   . Hiatal hernia   . History of colon cancer   . HOH (hard of hearing)   . Hyperlipidemia   . Hypersomnia, persistent 11/24/2013   Reports Epworth of 10 and higher on CPAP with  good compliance.   . Hypertrophy of prostate without urinary obstruction and other lower urinary tract symptoms (LUTS)   . Impotence of organic origin   . Insomnia   . Memory change 04/19/2013  . OSA on CPAP   . Prostate cancer ([place])   . Prostatitis   . Stroke ([place])    tia in  2010  . TIA (transient ischemic attack)   . Vertigo     PAST SURGICAL HISTORY: Past Surgical History:  Procedure Laterality Date  . EYE SURGERY     both cataracts  . INTRACAPSULAR CATARACT EXTRACTION     Lt eye  . MASS EXCISION N/A 07/29/2013   Procedure: MINOR EXCISION OF CHEST KELOID CONTRACTURE;  Surgeon: Theodoro Kos, DO;  Location: [place];  Service: Plastics;  Laterality: N/A;  ACELL placement  . MINOR GRAFT APPLICATION N/A [id number]/7106   Procedure: MINOR GRAFT APPLICATION A CELL;  Surgeon: Theodoro Kos, DO;  Location: [place];  Service:  Plastics;  Laterality: N/A;  . REFRACTIVE SURGERY      FAMILY HISTORY: Family History  Problem Relation Age of Onset  . Cirrhosis Mother   . Jaundice Mother   . Hyperlipidemia Father   . Heart disease Father   . Colon cancer Neg Hx     SOCIAL HISTORY: Social History   Social History  . Marital status: Single    Spouse name: N/A  . Number of children: 2  . Years of education: 15   Occupational History  . Retired    Social History Main Topics  . Smoking status: Former Smoker    Quit date: 07/23/1962  . Smokeless tobacco: Never Used     Comment: Stopped in 1963  . Alcohol use 8.4 oz/week    14 Standard drinks or equivalent per week  Comment: 2-3 drinks nightly  . Drug use: No  . Sexual activity: Yes     Comment: 5 partners   Other Topics Concern  . Not on file   Social History Narrative   Patient is single.   Patient has two children.   Patient is retired.   Patient has a college education.   Patient is right-handed.   Patient drinks about 3 cups of coffee daily.   Patient gets regular exercise.     PHYSICAL EXAM  Vitals:   08/01/17 0756  BP: 106/60  Pulse: 66  Weight: 141 lb 12.8 oz (64.3 kg)   Body mass index is 23.6 kg/m.  Generalized: Well developed, in no acute distress  Head: normocephalic and atraumatic,. Oropharynx benign [place] 2-3 Neck: Supple,  Musculoskeletal: No deformity   Neurological examination   Mentation: Alert oriented to time, place, history taking. Attention span and concentration appropriate. Recent and remote memory intact.  Follows all commands speech and language fluent.   Cranial nerve II-XII: .Pupils were equal round reactive to light extraocular movements were full, visual field were full on confrontational test. Facial sensation and strength were normal. hearing was intact to finger rubbing bilaterally. Uvula tongue midline. head turning and shoulder shrug were normal and symmetric.Tongue protrusion into cheek  strength was normal. Motor: normal bulk and tone, full strength in the BUE, BLE,  Sensory: normal and symmetric to light touch,  Coordination: finger-nose-finger, heel-to-shin bilaterally, no dysmetria Reflexes: Symmetric upper and lower plantar responses were flexor bilaterally. Gait and Station: Rising up from seated position without assistance, normal stance,  moderate stride, good arm swing, smooth turning, able to perform tiptoe, and heel walking without difficulty. Tandem gait is steady  DIAGNOSTIC DATA (LABS, IMAGING, TESTING) - I reviewed patient records, labs, notes, testing and imaging myself where available.  Lab Results  Component Value Date   WBC 6.3 07/28/2017   HGB 15.5 07/28/2017   HCT 46.1 07/28/2017   MCV 89 07/28/2017   PLT 310 07/28/2017      Component Value Date/Time   NA 142 07/28/2017 1504   K 4.3 07/28/2017 1504   CL 102 07/28/2017 1504   CO2 25 07/28/2017 1504   GLUCOSE 87 07/28/2017 1504   GLUCOSE 82 07/24/2016 1432   BUN 14 07/28/2017 1504   CREATININE 0.91 07/28/2017 1504   CREATININE 0.78 07/24/2016 1432   CALCIUM 10.4 (H) 07/28/2017 1504   PROT 7.0 07/28/2017 1504   ALBUMIN 4.7 07/28/2017 1504   AST 17 07/28/2017 1504   ALT 11 07/28/2017 1504   ALKPHOS 86 07/28/2017 1504   BILITOT 0.5 07/28/2017 1504   GFRNONAA 79 07/28/2017 1504   GFRNONAA 87 04/18/2015 0826   GFRAA 92 07/28/2017 1504   GFRAA >89 04/18/2015 0826   Lab Results  Component Value Date   CHOL 194 07/28/2017   HDL 54 07/28/2017   LDLCALC 120 (H) 07/28/2017   TRIG 98 07/28/2017   CHOLHDL 3.6 07/28/2017    Lab Results  Component Value Date   VITAMINB12 656 07/24/2016   Lab Results  Component Value Date   TSH 2.340 07/28/2017      ASSESSMENT AND PLAN  82 y.o. year old male  has a past medical history Obstructive sleep apnea on BiPAP.BiPAP Compliance data reviewed today for 30 days from 03/31/2017 2 04/29/2017 100% compliance for 30 days greater than 4 hours. Average  usage 7 hours 14 minutes. Pressure set 16 cm/12 cm of pressure. AHI 21.3. Central apneas 0.3. Obstructive apneas  17.2. Repeat compliance todaydated 07/02/2017 through 9/27/ 2018 Shows 90% compliance greater than 4 hours. Average usage 6 hours 18 minutes. Pressure set 13-17 cm. AHI 32.7 central apneas 0.3. Obstructive apneas 29.2. Patient also feels he has a Leak however this is not evident on the report.  PLAN: Discussed with Dr. [name] Fairy Will increase pressure to 19/15.  Download in 2 weeks requested from [facility] Orders to [facility]  F/U in 6 weeks Dennie Bible, [facility], [facility], [facility] [address] [place], [place] [address] [phone]

## 2017-08-01 ENCOUNTER — Ambulatory Visit (INDEPENDENT_AMBULATORY_CARE_PROVIDER_SITE_OTHER): Payer: PPO | Admitting: Nurse Practitioner

## 2017-08-01 ENCOUNTER — Telehealth: Payer: Self-pay | Admitting: *Deleted

## 2017-08-01 ENCOUNTER — Encounter: Payer: Self-pay | Admitting: Nurse Practitioner

## 2017-08-01 VITALS — BP 106/60 | HR 66 | Wt 141.8 lb

## 2017-08-01 DIAGNOSIS — G4733 Obstructive sleep apnea (adult) (pediatric): Secondary | ICD-10-CM | POA: Diagnosis not present

## 2017-08-01 DIAGNOSIS — G4710 Hypersomnia, unspecified: Secondary | ICD-10-CM | POA: Diagnosis not present

## 2017-08-01 NOTE — Progress Notes (Signed)
I agree with the assessment and plan as directed by NP and we discussed the CPAP - BiPAP download in person. .The patient is known to me .   David Costilow, MD

## 2017-08-01 NOTE — Patient Instructions (Signed)
90% compliance  however obstructive apneas worse Will check with Dr. [name] Fairy on Monday for changes and have patient return in 6 weeks

## 2017-08-01 NOTE — Telephone Encounter (Signed)
Per Daun Peacock, NP, called patient and informed him that an order was placed to increase his pressure after NP talked to Dr. [name] Fairy. Advised him the NP will get a download in 2 weeks, then he is to f/u with Dr [name] Fairy in 6 weeks. This RN was unable to find opening for patient to see Dr [name] Fairy, so advised patient this note will be sent to [facility], RN to give him a call next week and schedule. Patient verbalized understanding, appreciation.

## 2017-08-01 NOTE — Progress Notes (Signed)
Per Daun Peacock, NP, called patient and informed him that an order was placed to increase his pressure after NP talked to Dr. [name] Fairy. Advised him the NP will get a download in 2 weeks, then he is to f/u with Dr [name] Fairy in 6 weeks. This RN was unable to find opening for patient to see Dr [name] Fairy, so advised patient this note will be sent to [facility], RN to give him a call next week and schedule. Patient verbalized understanding, appreciation.

## 2017-08-01 NOTE — Telephone Encounter (Signed)
-----   Message from Patsy [place] sent at 08/01/2017 12:17 PM EDT ----- Regarding: RE: cpap pressure change Lyn Hollingshead, I have pulled this order, sent into the office for review, processing and requested a download as well. Thanks.   Angie ----- Message ----- From: Brandon Melnick, RN Sent: 08/01/2017  12:01 PM To: Patsy [place] Subject: cpap pressure change                           Hi Angela,  New cpap pressure changes.  Can we also get download in 2 wk after change.    Thanks,    [place]

## 2017-08-05 NOTE — Telephone Encounter (Signed)
Called patient, we were able to get him scheduled over a lunch break. Nov 6 at 12:15 pm

## 2017-08-18 ENCOUNTER — Other Ambulatory Visit: Payer: Self-pay | Admitting: Adult Health

## 2017-08-19 DIAGNOSIS — G4733 Obstructive sleep apnea (adult) (pediatric): Secondary | ICD-10-CM | POA: Diagnosis not present

## 2017-09-09 ENCOUNTER — Ambulatory Visit: Payer: Self-pay | Admitting: Neurology

## 2017-09-10 ENCOUNTER — Encounter: Payer: Self-pay | Admitting: Neurology

## 2017-09-10 DIAGNOSIS — C61 Malignant neoplasm of prostate: Secondary | ICD-10-CM | POA: Diagnosis not present

## 2017-09-17 DIAGNOSIS — N486 Induration penis plastica: Secondary | ICD-10-CM | POA: Diagnosis not present

## 2017-09-17 DIAGNOSIS — C61 Malignant neoplasm of prostate: Secondary | ICD-10-CM | POA: Diagnosis not present

## 2017-09-17 DIAGNOSIS — N401 Enlarged prostate with lower urinary tract symptoms: Secondary | ICD-10-CM | POA: Diagnosis not present

## 2017-09-17 DIAGNOSIS — R3915 Urgency of urination: Secondary | ICD-10-CM | POA: Diagnosis not present

## 2017-09-17 DIAGNOSIS — N5201 Erectile dysfunction due to arterial insufficiency: Secondary | ICD-10-CM | POA: Diagnosis not present

## 2017-09-19 ENCOUNTER — Telehealth: Payer: Self-pay | Admitting: Family Medicine

## 2017-09-19 DIAGNOSIS — R490 Dysphonia: Secondary | ICD-10-CM

## 2017-09-19 DIAGNOSIS — G4733 Obstructive sleep apnea (adult) (pediatric): Secondary | ICD-10-CM | POA: Diagnosis not present

## 2017-09-19 NOTE — Telephone Encounter (Signed)
Copied from [place] [id number]. Topic: Appointment Scheduling - Scheduling Inquiry for Clinic >> Sep 15, 2017  2:46 PM Hewitt Shorts wrote: Reason for CRM: pt is needing to talk with shaw about if he needs to come back in (she is booked til after thanksgiving) he states that the throat issues he has had the drops did not work and he wants to know the next step  Best number [phone]

## 2017-09-19 NOTE — Telephone Encounter (Signed)
Please advise 

## 2017-09-20 NOTE — Telephone Encounter (Signed)
I'm not booked up- I have 8 am on Mon 11/19 open and all of Wed afternoon 11/21 is open.  I suspect it might be due to his cpap/sleeping issues.  Or if he prefers, happy to refer him back to ENT Dr. Janace Hoard for repeat eval.

## 2017-09-22 ENCOUNTER — Encounter: Payer: Self-pay | Admitting: Neurology

## 2017-09-22 NOTE — Telephone Encounter (Signed)
Dr. Brigitte Pulse,   He would just like a referral put in to see Dr. Janace Hoard again.

## 2017-09-22 NOTE — Telephone Encounter (Signed)
Referral placed.

## 2017-09-24 ENCOUNTER — Encounter: Payer: Self-pay | Admitting: Neurology

## 2017-09-24 ENCOUNTER — Ambulatory Visit: Payer: PPO | Admitting: Neurology

## 2017-09-24 VITALS — BP 127/79 | HR 77 | Wt 145.0 lb

## 2017-09-24 DIAGNOSIS — R0681 Apnea, not elsewhere classified: Secondary | ICD-10-CM | POA: Diagnosis not present

## 2017-09-24 DIAGNOSIS — G4733 Obstructive sleep apnea (adult) (pediatric): Secondary | ICD-10-CM | POA: Diagnosis not present

## 2017-09-24 DIAGNOSIS — K219 Gastro-esophageal reflux disease without esophagitis: Secondary | ICD-10-CM

## 2017-09-24 NOTE — Progress Notes (Signed)
[facility]  PATIENT: [name] DOB: Oct 26, 1935   REASON FOR VISIT: Follow-up obstructive sleep apnea on BiPAP HISTORY FROM: Patient    HISTORY OF PRESENT ILLNESS: Interval history from 24 September 2017, I have the pleasure of meeting today with [name] after a one-year hiatus.  The patient has been followed for CPAP compliance in our office for several years now.  He has a high compliance again 87% of the time, average use of time is 5 hours and 31 minutes, he is using a VPAP auto machine inspiratory pressure is set at 19 cm water expiratory pressure at 15, he does not have detectable air leaks but still high a number of obstructive apneas which has bothered me for years.  He has not noticed a difference between BiPAP and CPAP.  There has been a trend over the last 30 days to higher AHI is from about 10 on average each night to almost 40, and this is not corresponding to air leaks, usage time there has been no adjustment in pressure. The BiPAP's consistently good airflow pattern and data for respiratory rate contradict the measured residual AHI of over 30. He has severe GERD- could that be the  Artefact ? - yes, oesophageal pressure will induce a reading of OSA, he is hoarse , too.  He should change to an elevated pillow, wedge .    UPDATE 05/01/2017  [name], 82 year old male returns for follow-up with history of obstructive sleep apnea. He was on CPAP when last seen he was switched to BiPAP due to high residual apnea index. He feels that he is doing well on BiPAP Compliance data reviewed today for 30 days from 03/31/2017 2 04/29/2017 100% compliance for 30 days greater than 4 hours. Average usage 7 hours 14 minutes. Pressure set 16 cm/12 cm of pressure. AHI 21.3. Central apneas 0.3. Obstructive apneas 17.2. He returns to discuss this download  01/30/17 [name] is a 82 year old male with a history of memory disturbance and obstructive sleep apnea on  CPAP. He returns today for a compliance download. At the last visit his pressure was increased. His download indicates that he uses his machine 60/60 days for compliance of 100%. He uses machine greater than 4 hours 32/60 days for compliance of 53%. On average he uses his machine 4 hours and 7 minutes. He is on auto set with a minimum pressure of 6 cm water and maximum pressure of 20 cm of water with EPR 2. His average pressure is 18.7 cm/m. His residual AHI is 25.1. He returns today to discuss his download.   11/25/2016: [name] is a 82 year old male with a history of memory disturbance and obstructive sleep apnea on CPAP. He returns today for follow-up. The patient's CPAP download indicates that he uses machine 28 out of 30 days for compliance of 93%. He only uses machine greater than 4 hour 12 out of 30 days for compliance of 40%. His residual AHI is 24.8 on a minimum pressure of 6cm of water with a maximum pressure 15 cmof water. His leak in the 95th percentile is 24.9 L/m. The patient's last download was approximately 2 years ago. At that time the download showed excellent treatment of his apnea. The patient feels that his memory has remained stable. He livesat home alone. He is able to complete all ADLs independently. He operates a Teacher, music without difficulty. He is able to manage his finances and prepares all meals without difficulty. He  returns today for an evaluation.   REVIEW OF SYSTEMS: Full 14 system review of systems performed and notable only for those listed, all others are neg:   Hoarseness , GERD,   Obstructive sleep apnea with BiPAP   ALLERGIES: Allergies  Allergen Reactions  . Sulfamethoxazole Other (See Comments)    Constipation  . Sulfamethoxazole-Trimethoprim     constipation    HOME MEDICATIONS: Outpatient Medications Prior to Visit  Medication Sig Dispense Refill  . allopurinol (ZYLOPRIM) 100 MG tablet TAKE 2 TABLETS BY MOUTH DAILY 180 tablet 0  . aspirin  81 MG tablet Take 81 mg by mouth daily.      . cycloSPORINE (RESTASIS) 0.05 % ophthalmic emulsion 1 drop 2 (two) times daily. As needed.     . donepezil (ARICEPT) 10 MG tablet TAKE 1 TABLET BY MOUTH AT BEDTIME 90 tablet 3  . finasteride (PROSCAR) 5 MG tablet Take 5 mg by mouth daily.    . mirtazapine (REMERON) 45 MG tablet TAKE 1 TABLET (45 MG TOTAL) BY MOUTH AT BEDTIME. 90 tablet 2  . Multiple Vitamin (MULTIVITAMIN) capsule Take 1 capsule by mouth daily.    Marland Kitchen NEXIUM 40 MG capsule TAKE 1 CAPSULE BY MOUTH DAILY BEFORE BREAKFAST. 90 capsule 6  . valACYclovir (VALTREX) 1000 MG tablet TAKE 1/2 TABLET BY MOUTH ONCE DAILY 45 tablet 3  . fluticasone (FLONASE) 50 MCG/ACT nasal spray Place 2 sprays into both nostrils at bedtime. (Patient not taking: Reported on 09/24/2017) 16 g 2  . polyethylene glycol (MIRALAX / GLYCOLAX) packet Take 17 g by mouth daily as needed.      No facility-administered medications prior to visit.     PAST MEDICAL HISTORY: Past Medical History:  Diagnosis Date  . Barrett's esophagus   . Cataract   . Diverticulosis of colon (without mention of hemorrhage)   . Elevated PSA   . Esophageal stricture   . GERD (gastroesophageal reflux disease)   . Glaucoma   . Gout   . Herpes   . Hiatal hernia   . History of colon cancer   . HOH (hard of hearing)   . Hyperlipidemia   . Hypersomnia, persistent 11/24/2013   Reports Epworth of 10 and higher on CPAP with  good compliance.   . Hypertrophy of prostate without urinary obstruction and other lower urinary tract symptoms (LUTS)   . Impotence of organic origin   . Insomnia   . Memory change 04/19/2013  . OSA on CPAP   . Prostate cancer ([place])   . Prostatitis   . Stroke ([place])    tia in  2010  . TIA (transient ischemic attack)   . Vertigo     PAST SURGICAL HISTORY: Past Surgical History:  Procedure Laterality Date  . EYE SURGERY     both cataracts  . INTRACAPSULAR CATARACT EXTRACTION     Lt eye  . MASS EXCISION N/A  07/29/2013   Procedure: MINOR EXCISION OF CHEST KELOID CONTRACTURE;  Surgeon: Theodoro Kos, DO;  Location: [place];  Service: Plastics;  Laterality: N/A;  ACELL placement  . MINOR GRAFT APPLICATION N/A 3/81/0175   Procedure: MINOR GRAFT APPLICATION A CELL;  Surgeon: Theodoro Kos, DO;  Location: [place];  Service: Plastics;  Laterality: N/A;  . REFRACTIVE SURGERY      FAMILY HISTORY: Family History  Problem Relation Age of Onset  . Cirrhosis Mother   . Jaundice Mother   . Hyperlipidemia Father   . Heart disease Father   .  Colon cancer Neg Hx     SOCIAL HISTORY: Social History   Socioeconomic History  . Marital status: Single    Spouse name: Not on file  . Number of children: 2  . Years of education: 72  . Highest education level: Not on file  Social Needs  . Financial resource strain: Not on file  . Food insecurity - worry: Not on file  . Food insecurity - inability: Not on file  . Transportation needs - medical: Not on file  . Transportation needs - non-medical: Not on file  Occupational History  . Occupation: Retired  Tobacco Use  . Smoking status: Former Smoker    Last attempt to quit: 07/23/1962    Years since quitting: 55.2  . Smokeless tobacco: Never Used  . Tobacco comment: Stopped in 1963  Substance and Sexual Activity  . Alcohol use: Yes    Alcohol/week: 8.4 oz    Types: 14 Standard drinks or equivalent per week    Comment: 2-3 drinks nightly  . Drug use: No  . Sexual activity: Yes    Comment: 5 partners  Other Topics Concern  . Not on file  Social History Narrative   Patient is single.   Patient has two children.   Patient is retired.   Patient has a college education.   Patient is right-handed.   Patient drinks about 3 cups of coffee daily.   Patient gets regular exercise.     PHYSICAL EXAM  Vitals:   09/24/17 1044  BP: 127/79  Pulse: 77  Weight: 145 lb (65.8 kg)   Body mass index is 24.13  kg/m.  Generalized: Well developed, in no acute distress  Head: normocephalic and atraumatic,. Oropharynx red and puffy- mallopatti 2-3- but very hoarse voice  Neck: Supple, 14 inches Musculoskeletal: No deformity   Neurological examination   Mentation: Alert oriented to time, place, history taking. Attention span and concentration appropriate. Recent and remote memory intact.  Follows all commands speech and language fluent.   Cranial nerve : no change is taste or smell .Pupils were equal round reactive to light extraocular movements were full, visual field were full on confrontational test. Facial sensation and strength were normal.  hearing loss bilaterally.  Uvula and tongue move in  midline. Full head turning and shoulder shrug were normal and symmetric.Tongue protrusion into cheek strength was normal. Motor: normal bulk and tone, full strength in the BUE, BLE, no cog-wheeling, no spasticity.  Sensory: normal and symmetric to light touch,  Coordination: finger-nose-finger intact  Reflexes: Symmetric upper and lower plantar responses were intact bilaterally. Gait and Station: Rising up from seated position without assistance, normal stance,  moderate stride, good arm swing, smooth turning, able to perform tiptoe, and heel walking without difficulty. Tandem gait is steady  DIAGNOSTIC DATA (LABS, IMAGING, TESTING) - I reviewed patient records, labs, notes, testing and imaging myself where available.  Lab Results  Component Value Date   WBC 6.3 07/28/2017   HGB 15.5 07/28/2017   HCT 46.1 07/28/2017   MCV 89 07/28/2017   PLT 310 07/28/2017      Component Value Date/Time   NA 142 07/28/2017 1504   K 4.3 07/28/2017 1504   CL 102 07/28/2017 1504   CO2 25 07/28/2017 1504   GLUCOSE 87 07/28/2017 1504   GLUCOSE 82 07/24/2016 1432   BUN 14 07/28/2017 1504   CREATININE 0.91 07/28/2017 1504   CREATININE 0.78 07/24/2016 1432   CALCIUM 10.4 (H) 07/28/2017 1504   PROT  7.0 07/28/2017  1504   ALBUMIN 4.7 07/28/2017 1504   AST 17 07/28/2017 1504   ALT 11 07/28/2017 1504   ALKPHOS 86 07/28/2017 1504   BILITOT 0.5 07/28/2017 1504   GFRNONAA 79 07/28/2017 1504   GFRNONAA 87 04/18/2015 0826   GFRAA 92 07/28/2017 1504   GFRAA >89 04/18/2015 0826   Lab Results  Component Value Date   CHOL 194 07/28/2017   HDL 54 07/28/2017   LDLCALC 120 (H) 07/28/2017   TRIG 98 07/28/2017   CHOLHDL 3.6 07/28/2017   No results found for: HGBA1C Lab Results  Component Value Date   VPXTGGYI94 854 07/24/2016   Lab Results  Component Value Date   TSH 2.340 07/28/2017      ASSESSMENT AND PLAN  82 y.o. year old male  has a past medical history Obstructive sleep apnea on BiPAP.BiPAP Compliance data reviewed today for 30 days from 08-24-2017 through 09-22-2017 , 93% compliance for 30 days greater than 4 hours.  Average usage 5 hours 36 minutes.  Pressure set 19 over 15 cm of pressure BiPAP without ST- . AHI 31.4 . Central apneas 0.1 - unexplained- except if patient gulps air or has high intra esophageal pressure, which he mentioned tome today. He sleeps well   BiPAP compliance is good but OSA increased with higher pressure BiPAP settings !  will leave V BiPAP back at 13 over 9 cm water and see him in 12 month.  [facility] is his DME>    Larey Seat, MD   [facility] [address], [place], [place] [address] [phone]

## 2017-10-19 DIAGNOSIS — G4733 Obstructive sleep apnea (adult) (pediatric): Secondary | ICD-10-CM | POA: Diagnosis not present

## 2017-11-10 DIAGNOSIS — R490 Dysphonia: Secondary | ICD-10-CM | POA: Diagnosis not present

## 2017-11-10 DIAGNOSIS — K219 Gastro-esophageal reflux disease without esophagitis: Secondary | ICD-10-CM | POA: Diagnosis not present

## 2017-11-14 ENCOUNTER — Other Ambulatory Visit: Payer: Self-pay | Admitting: Otolaryngology

## 2017-11-14 DIAGNOSIS — R490 Dysphonia: Secondary | ICD-10-CM

## 2017-11-14 DIAGNOSIS — K219 Gastro-esophageal reflux disease without esophagitis: Secondary | ICD-10-CM

## 2017-11-19 DIAGNOSIS — G4733 Obstructive sleep apnea (adult) (pediatric): Secondary | ICD-10-CM | POA: Diagnosis not present

## 2017-11-20 ENCOUNTER — Ambulatory Visit
Admission: RE | Admit: 2017-11-20 | Discharge: 2017-11-20 | Disposition: A | Discharge status: Home / Self Care | Payer: PPO | Source: Ambulatory Visit | Attending: Otolaryngology | Admitting: Otolaryngology

## 2017-11-20 DIAGNOSIS — J329 Chronic sinusitis, unspecified: Secondary | ICD-10-CM | POA: Diagnosis not present

## 2017-11-20 DIAGNOSIS — R490 Dysphonia: Secondary | ICD-10-CM

## 2017-11-20 DIAGNOSIS — K219 Gastro-esophageal reflux disease without esophagitis: Secondary | ICD-10-CM

## 2017-11-20 MED ORDER — IOPAMIDOL (ISOVUE-300) INJECTION 61%
75.0000 mL | Freq: Once | INTRAVENOUS | Status: AC | PRN
  Administered 2017-11-20: 75 mL via INTRAVENOUS

## 2017-11-27 DIAGNOSIS — H16223 Keratoconjunctivitis sicca, not specified as Sjogren's, bilateral: Secondary | ICD-10-CM | POA: Diagnosis not present

## 2017-11-27 DIAGNOSIS — H40053 Ocular hypertension, bilateral: Secondary | ICD-10-CM | POA: Diagnosis not present

## 2017-11-27 DIAGNOSIS — Z961 Presence of intraocular lens: Secondary | ICD-10-CM | POA: Diagnosis not present

## 2017-12-20 DIAGNOSIS — G4733 Obstructive sleep apnea (adult) (pediatric): Secondary | ICD-10-CM | POA: Diagnosis not present

## 2018-01-15 DIAGNOSIS — G4733 Obstructive sleep apnea (adult) (pediatric): Secondary | ICD-10-CM | POA: Diagnosis not present

## 2018-01-17 DIAGNOSIS — G4733 Obstructive sleep apnea (adult) (pediatric): Secondary | ICD-10-CM | POA: Diagnosis not present

## 2018-02-17 DIAGNOSIS — G4733 Obstructive sleep apnea (adult) (pediatric): Secondary | ICD-10-CM | POA: Diagnosis not present

## 2018-02-21 DIAGNOSIS — H9212 Otorrhea, left ear: Secondary | ICD-10-CM | POA: Diagnosis not present

## 2018-02-23 DIAGNOSIS — H60501 Unspecified acute noninfective otitis externa, right ear: Secondary | ICD-10-CM | POA: Diagnosis not present

## 2018-03-10 ENCOUNTER — Other Ambulatory Visit: Payer: Self-pay | Admitting: Neurology

## 2018-03-17 DIAGNOSIS — H60501 Unspecified acute noninfective otitis externa, right ear: Secondary | ICD-10-CM | POA: Diagnosis not present

## 2018-03-19 DIAGNOSIS — G4733 Obstructive sleep apnea (adult) (pediatric): Secondary | ICD-10-CM | POA: Diagnosis not present

## 2018-04-01 ENCOUNTER — Other Ambulatory Visit: Payer: Self-pay | Admitting: Family Medicine

## 2018-04-02 NOTE — Telephone Encounter (Signed)
Allopurinol refill Last OV:07/28/17 Last refill:02/14/17 180 tab/0 refill YOO:[id number] Pharmacy: [facility] [id number] [place] [phone] (Phone) [phone] (Fax)

## 2018-04-30 DIAGNOSIS — C61 Malignant neoplasm of prostate: Secondary | ICD-10-CM | POA: Diagnosis not present

## 2018-05-08 DIAGNOSIS — N401 Enlarged prostate with lower urinary tract symptoms: Secondary | ICD-10-CM | POA: Diagnosis not present

## 2018-05-08 DIAGNOSIS — N5201 Erectile dysfunction due to arterial insufficiency: Secondary | ICD-10-CM | POA: Diagnosis not present

## 2018-05-08 DIAGNOSIS — R3915 Urgency of urination: Secondary | ICD-10-CM | POA: Diagnosis not present

## 2018-05-08 DIAGNOSIS — C61 Malignant neoplasm of prostate: Secondary | ICD-10-CM | POA: Diagnosis not present

## 2018-05-26 ENCOUNTER — Encounter: Payer: Self-pay | Admitting: Nurse Practitioner

## 2018-05-27 NOTE — Progress Notes (Signed)
[facility]  PATIENT: Dabid Godown Sprinkle III DOB: 1935/08/27   REASON FOR VISIT: Follow-up obstructive sleep apnea on BiPAP HISTORY FROM: Patient    HISTORY OF PRESENT ILLNESS:UPDATE 7/25/2019CM Mr Fare, 83 year old male returns for follow-up for BiPAP compliance.  He continues to have compliance greater than 4 hours at 83%.  Average usage 6 hours 5 minutes.  Set pressure 9 to 13 cm.  Leak 95th percentile 4.1 AHI 14.4 which is much improved from last visit.  No central apneas ESS 4.  He still says he is not sleeping enough that he did get a wedge and that has helped with his GERD.  He returns for reevaluation  Interval history from 24 September 2017,CD I have the pleasure of meeting today with Mr. Milhouse after a one-year hiatus.  The patient has been followed for CPAP compliance in our office for several years now.  He has a high compliance again 87% of the time, average use of time is 5 hours and 31 minutes, he is using a VPAP auto machine inspiratory pressure is set at 19 cm water expiratory pressure at 15, he does not have detectable air leaks but still high a number of obstructive apneas which has bothered me for years.  He has not noticed a difference between BiPAP and CPAP.  There has been a trend over the last 30 days to higher AHI is from about 10 on average each night to almost 40, and this is not corresponding to air leaks, usage time there has been no adjustment in pressure. The BiPAP's consistently good airflow pattern and data for respiratory rate contradict the measured residual AHI of over 30. He has severe GERD- could that be the  Artefact ? - yes, oesophageal pressure will induce a reading of OSA, he is hoarse , too.  He should change to an elevated pillow, wedge .      UPDATE 9/28 /2018CM Mr.Burpee, 83 year old male returns for follow-up with history of obstructive sleep apnea on BiPAP. He feels he is doing well however compliance data actually worse than 3  months ago. Data dated 07/02/2017 through 9/27/ 2018 Shows 90% compliance greater than 4 hours. Average usage 6 hours 18 minutes. Pressure set 13-17 cm. AHI 32.7 central apneas 0.3. Obstructive apneas 29.2. Patient also feels he has a Leak however this is not evident on the report.Marland Kitchen He is wondering if his Remeron can be increased. He returns for reevaluation    UPDATE 6/28/ 2018CM Mr. Gulick, 83 year old male returns for follow-up with history of obstructive sleep apnea. He was on CPAP when last seen he was switched to BiPAP due to high residual apnea index. He feels that he is doing well on BiPAP Compliance data reviewed today for 30 days from 03/31/2017 2 04/29/2017 100% compliance for 30 days greater than 4 hours. Average usage 7 hours 14 minutes. Pressure set 16 cm/12 cm of pressure. AHI 21.3. Central apneas 0.3. Obstructive apneas 17.2. He returns to discuss this download 01/30/17 MMMr. Brew is a 83 year old male with a history of memory disturbance and obstructive sleep apnea on CPAP. He returns today for a compliance download. At the last visit his pressure was increased. His download indicates that he uses his machine 60/60 days for compliance of 100%. He uses machine greater than 4 hours 32/60 days for compliance of 53%. On average he uses his machine 4 hours and 7 minutes. He is on auto set with a minimum pressure of 6 cm water and maximum  pressure of 20 cm of water with EPR 2. His average pressure is 18.7 cm/m. His residual AHI is 25.1. He returns today to discuss his download.   11/25/2016: Mr. Tuminello is a 83 year old male with a history of memory disturbance and obstructive sleep apnea on CPAP. He returns today for follow-up. The patient's CPAP download indicates that he uses machine 28 out of 30 days for compliance of 93%. He only uses machine greater than 4 hour 12 out of 30 days for compliance of 40%. His residual AHI is 24.8 on a minimum pressure of 6cm of water with a maximum pressure  15 cmof water. His leak in the 95th percentile is 24.9 L/m. The patient's last download was approximately 2 years ago. At that time the download showed excellent treatment of his apnea. The patient feels that his memory has remained stable. He livesat home alone. He is able to complete all ADLs independently. He operates a Teacher, music without difficulty. He is able to manage his finances and prepares all meals without difficulty. He returns today for an evaluation.   REVIEW OF SYSTEMS: Full 14 system review of systems performed and notable only for those listed, all others are neg:  Constitutional: neg  Cardiovascular: neg Ear/Nose/Throat: neg  Skin: neg Eyes: neg Respiratory: neg Gastroitestinal: neg  Hematology/Lymphatic: neg  Endocrine: neg Musculoskeletal:neg Allergy/Immunology: neg Neurological: neg Psychiatric: neg Sleep : Obstructive sleep apnea with BiPAP   ALLERGIES: Allergies  Allergen Reactions  . Sulfamethoxazole Other (See Comments)    Constipation  . Sulfamethoxazole-Trimethoprim     constipation    HOME MEDICATIONS: Outpatient Medications Prior to Visit  Medication Sig Dispense Refill  . allopurinol (ZYLOPRIM) 100 MG tablet TAKE 2 TABLETS BY MOUTH DAILY 60 tablet 0  . aspirin 81 MG tablet Take 81 mg by mouth daily.      . cycloSPORINE (RESTASIS) 0.05 % ophthalmic emulsion 1 drop 2 (two) times daily. As needed.     . donepezil (ARICEPT) 10 MG tablet TAKE 1 TABLET BY MOUTH AT BEDTIME 90 tablet 3  . finasteride (PROSCAR) 5 MG tablet Take 5 mg by mouth daily.    . mirtazapine (REMERON) 45 MG tablet take 1 tablet by mouth at bedtime 90 tablet 1  . Multiple Vitamin (MULTIVITAMIN) capsule Take 1 capsule by mouth daily.    Marland Kitchen NEXIUM 40 MG capsule TAKE 1 CAPSULE BY MOUTH DAILY BEFORE BREAKFAST. 90 capsule 6  . valACYclovir (VALTREX) 1000 MG tablet TAKE 1/2 TABLET BY MOUTH ONCE DAILY 45 tablet 3   No facility-administered medications prior to visit.     PAST  MEDICAL HISTORY: Past Medical History:  Diagnosis Date  . Barrett's esophagus   . Cataract   . Diverticulosis of colon (without mention of hemorrhage)   . Elevated PSA   . Esophageal stricture   . GERD (gastroesophageal reflux disease)   . Glaucoma   . Gout   . Herpes   . Hiatal hernia   . History of colon cancer   . HOH (hard of hearing)   . Hyperlipidemia   . Hypersomnia, persistent 11/24/2013   Reports Epworth of 10 and higher on CPAP with  good compliance.   . Hypertrophy of prostate without urinary obstruction and other lower urinary tract symptoms (LUTS)   . Impotence of organic origin   . Insomnia   . Memory change 04/19/2013  . OSA on CPAP   . Prostate cancer ([place])   . Prostatitis   . Stroke [facility])  tia in  2010  . TIA (transient ischemic attack)   . Vertigo     PAST SURGICAL HISTORY: Past Surgical History:  Procedure Laterality Date  . EYE SURGERY     both cataracts  . INTRACAPSULAR CATARACT EXTRACTION     Lt eye  . MASS EXCISION N/A 07/29/2013   Procedure: MINOR EXCISION OF CHEST KELOID CONTRACTURE;  Surgeon: Theodoro Kos, DO;  Location: [place];  Service: Plastics;  Laterality: N/A;  ACELL placement  . MINOR GRAFT APPLICATION N/A 6/38/7564   Procedure: MINOR GRAFT APPLICATION A CELL;  Surgeon: Theodoro Kos, DO;  Location: [place];  Service: Plastics;  Laterality: N/A;  . REFRACTIVE SURGERY      FAMILY HISTORY: Family History  Problem Relation Age of Onset  . Cirrhosis Mother   . Jaundice Mother   . Hyperlipidemia Father   . Heart disease Father   . Colon cancer Neg Hx     SOCIAL HISTORY: Social History   Socioeconomic History  . Marital status: Single    Spouse name: Not on file  . Number of children: 2  . Years of education: 66  . Highest education level: Not on file  Occupational History  . Occupation: Retired  Scientific laboratory technician  . Financial resource strain: Not on file  . Food insecurity:    Worry: Not  on file    Inability: Not on file  . Transportation needs:    Medical: Not on file    Non-medical: Not on file  Tobacco Use  . Smoking status: Former Smoker    Last attempt to quit: 07/23/1962    Years since quitting: 55.8  . Smokeless tobacco: Never Used  . Tobacco comment: Stopped in 1963  Substance and Sexual Activity  . Alcohol use: Yes    Alcohol/week: 8.4 oz    Types: 14 Standard drinks or equivalent per week    Comment: 2-3 drinks nightly  . Drug use: No  . Sexual activity: Yes    Comment: 5 partners  Lifestyle  . Physical activity:    Days per week: Not on file    Minutes per session: Not on file  . Stress: Not on file  Relationships  . Social connections:    Talks on phone: Not on file    Gets together: Not on file    Attends religious service: Not on file    Active member of club or organization: Not on file    Attends meetings of clubs or organizations: Not on file    Relationship status: Not on file  . Intimate partner violence:    Fear of current or ex partner: Not on file    Emotionally abused: Not on file    Physically abused: Not on file    Forced sexual activity: Not on file  Other Topics Concern  . Not on file  Social History Narrative   Patient is single.   Patient has two children.   Patient is retired.   Patient has a college education.   Patient is right-handed.   Patient drinks about 3 cups of coffee daily.   Patient gets regular exercise.     PHYSICAL EXAM  Vitals:   05/28/18 0804  BP: (!) 157/82  Pulse: 78  Weight: 147 lb 3.2 oz (66.8 kg)  Height: 5\' 6"  (1.676 m)   Body mass index is 23.76 kg/m.  Generalized: Well developed, in no acute distress  Head: normocephalic and atraumatic,. Oropharynx benign mallopatti 2-3  Neck: Supple, circumference 14 inches Musculoskeletal: No deformity  Skin no edema Neurological examination   Mentation: Alert oriented to time, place, history taking. Attention span and concentration appropriate.  Recent and remote memory intact.  Follows all commands speech and language fluent.   Cranial nerve II-XII: .Pupils were equal round reactive to light extraocular movements were full, visual field were full on confrontational test. Facial sensation and strength were normal. hearing was intact to finger rubbing bilaterally. Uvula tongue midline. head turning and shoulder shrug were normal and symmetric.Tongue protrusion into cheek strength was normal. Motor: normal bulk and tone, full strength in the BUE, BLE,  Sensory: normal and symmetric to light touch,  Coordination: finger-nose-finger, heel-to-shin bilaterally, no dysmetria Gait and Station: Rising up from seated position without assistance, normal stance,  moderate stride, good arm swing, smooth turning, able to perform tiptoe, and heel walking without difficulty. Tandem gait is steady, no assistive device  DIAGNOSTIC DATA (LABS, IMAGING, TESTING) - I reviewed patient records, labs, notes, testing and imaging myself where available.  Lab Results  Component Value Date   WBC 6.3 07/28/2017   HGB 15.5 07/28/2017   HCT 46.1 07/28/2017   MCV 89 07/28/2017   PLT 310 07/28/2017      Component Value Date/Time   NA 142 07/28/2017 1504   K 4.3 07/28/2017 1504   CL 102 07/28/2017 1504   CO2 25 07/28/2017 1504   GLUCOSE 87 07/28/2017 1504   GLUCOSE 82 07/24/2016 1432   BUN 14 07/28/2017 1504   CREATININE 0.91 07/28/2017 1504   CREATININE 0.78 07/24/2016 1432   CALCIUM 10.4 (H) 07/28/2017 1504   PROT 7.0 07/28/2017 1504   ALBUMIN 4.7 07/28/2017 1504   AST 17 07/28/2017 1504   ALT 11 07/28/2017 1504   ALKPHOS 86 07/28/2017 1504   BILITOT 0.5 07/28/2017 1504   GFRNONAA 79 07/28/2017 1504   GFRNONAA 87 04/18/2015 0826   GFRAA 92 07/28/2017 1504   GFRAA >89 04/18/2015 0826   Lab Results  Component Value Date   CHOL 194 07/28/2017   HDL 54 07/28/2017   LDLCALC 120 (H) 07/28/2017   TRIG 98 07/28/2017   CHOLHDL 3.6 07/28/2017     Lab Results  Component Value Date   VITAMINB12 656 07/24/2016   Lab Results  Component Value Date   TSH 2.340 07/28/2017      ASSESSMENT AND PLAN  83 y.o. year old male  has a past medical history Obstructive sleep apnea on BiPAP.BiPAP data reviewed 04/27/2018 -05/26/2018 shows compliance greater than 4 hours at 83%.  Average usage 6 hours 5 minutes.  Set pressure 9 to 13 cm.  Leak 95th percentile 4.1.  AHI 14.4 ESS 4  PLAN: Discussed with Dr. [name] Fairy Will increase pressure to 13/10  We will order supplies F/U in 4 months, already has appt Dennie Bible, [facility], [facility], APRN  [facility] [address] [place], [place] [address] [phone]

## 2018-05-28 ENCOUNTER — Ambulatory Visit: Payer: PPO | Admitting: Nurse Practitioner

## 2018-05-28 ENCOUNTER — Encounter: Payer: Self-pay | Admitting: Nurse Practitioner

## 2018-05-28 VITALS — BP 157/82 | HR 78 | Ht 66.0 in | Wt 147.2 lb

## 2018-05-28 DIAGNOSIS — G4733 Obstructive sleep apnea (adult) (pediatric): Secondary | ICD-10-CM | POA: Diagnosis not present

## 2018-05-28 NOTE — Progress Notes (Signed)
Community message sent to [employer], [facility] via Epic re: CPAP orders.

## 2018-05-28 NOTE — Patient Instructions (Signed)
Discussed with Dr. [name] Fairy Will increase pressure to 13/10  F/U in 6 months

## 2018-07-29 ENCOUNTER — Other Ambulatory Visit: Payer: Self-pay | Admitting: Neurology

## 2018-07-29 ENCOUNTER — Other Ambulatory Visit: Payer: Self-pay | Admitting: Family Medicine

## 2018-07-29 ENCOUNTER — Ambulatory Visit (INDEPENDENT_AMBULATORY_CARE_PROVIDER_SITE_OTHER): Payer: PPO | Admitting: Family Medicine

## 2018-07-29 ENCOUNTER — Other Ambulatory Visit: Payer: Self-pay | Admitting: *Deleted

## 2018-07-29 ENCOUNTER — Encounter: Payer: Self-pay | Admitting: Family Medicine

## 2018-07-29 VITALS — BP 120/70 | HR 101 | Ht 66.0 in | Wt 144.1 lb

## 2018-07-29 DIAGNOSIS — Z0000 Encounter for general adult medical examination without abnormal findings: Secondary | ICD-10-CM | POA: Insufficient documentation

## 2018-07-29 DIAGNOSIS — G4700 Insomnia, unspecified: Secondary | ICD-10-CM | POA: Diagnosis not present

## 2018-07-29 DIAGNOSIS — Z5181 Encounter for therapeutic drug level monitoring: Secondary | ICD-10-CM | POA: Diagnosis not present

## 2018-07-29 DIAGNOSIS — R413 Other amnesia: Secondary | ICD-10-CM | POA: Diagnosis not present

## 2018-07-29 DIAGNOSIS — E7800 Pure hypercholesterolemia, unspecified: Secondary | ICD-10-CM | POA: Diagnosis not present

## 2018-07-29 DIAGNOSIS — L989 Disorder of the skin and subcutaneous tissue, unspecified: Secondary | ICD-10-CM | POA: Diagnosis not present

## 2018-07-29 DIAGNOSIS — G4733 Obstructive sleep apnea (adult) (pediatric): Secondary | ICD-10-CM

## 2018-07-29 DIAGNOSIS — K219 Gastro-esophageal reflux disease without esophagitis: Secondary | ICD-10-CM

## 2018-07-29 DIAGNOSIS — M109 Gout, unspecified: Secondary | ICD-10-CM | POA: Diagnosis not present

## 2018-07-29 DIAGNOSIS — Z0001 Encounter for general adult medical examination with abnormal findings: Secondary | ICD-10-CM

## 2018-07-29 MED ORDER — DONEPEZIL HCL 10 MG PO TABS: 10.0000 mg | ORAL_TABLET | Freq: Every day | ORAL | 3 refills | Status: DC

## 2018-07-29 NOTE — Progress Notes (Signed)
Subjective:  Patient ID: [name], male    DOB: 26-Mar-1935  Age: 83 y.o. MRN: [id number]  CC: Establish Care   HPI [name] presents for a physical exam.  Patient has a history of memory change, insomnia and obstructive sleep apnea.  These are all followed by his neurologist.  He is done well on mirtazapine and BiPAP therapy.  He is is using probiotic for GERD.  He says that reflux comes all the way up into the back of his throat.  He is reluctant to try proton pump inhibitors due to thinks he is read on the Internet.  He is followed by gastroenterology for this issue.  He has a history of gout that is responded to daily allopurinol.  He has had no issues taking this medicine.  He has a lesion on his right anterior chest wall that continues to recur.  He lives alone and takes care of all of his ADLs.  His daughter lives right next door.  [facility].  He quit smoking over 50 years ago.  He drinks 2-3 alcoholic drinks nightly.  Outpatient Medications Prior to Visit  Medication Sig Dispense Refill  . allopurinol (ZYLOPRIM) 100 MG tablet TAKE 2 TABLETS BY MOUTH DAILY 60 tablet 0  . aspirin 81 MG tablet Take 81 mg by mouth daily.      . cycloSPORINE (RESTASIS) 0.05 % ophthalmic emulsion 1 drop 2 (two) times daily. As needed.     . donepezil (ARICEPT) 10 MG tablet TAKE 1 TABLET BY MOUTH AT BEDTIME 90 tablet 3  . finasteride (PROSCAR) 5 MG tablet Take 5 mg by mouth daily.    . mirtazapine (REMERON) 45 MG tablet take 1 tablet by mouth at bedtime 90 tablet 1  . Multiple Vitamin (MULTIVITAMIN) capsule Take 1 capsule by mouth daily.    Marland Kitchen NEXIUM 40 MG capsule TAKE 1 CAPSULE BY MOUTH DAILY BEFORE BREAKFAST. 90 capsule 6  . Probiotic Product (ALIGN PO) Take 1 capsule by mouth daily.    . valACYclovir (VALTREX) 1000 MG tablet TAKE 1/2 TABLET BY MOUTH ONCE DAILY 45 tablet 3   No facility-administered medications prior to visit.     ROS Review of Systems   Constitutional: Negative for diaphoresis, fever and unexpected weight change.  HENT: Negative.   Eyes: Negative.   Respiratory: Negative.   Cardiovascular: Negative.   Gastrointestinal: Negative.   Endocrine: Negative for polyphagia and polyuria.  Genitourinary: Negative.   Musculoskeletal: Negative.   Skin: Positive for color change and rash.  Allergic/Immunologic: Negative for immunocompromised state.  Neurological: Negative.   Hematological: Does not bruise/bleed easily.  Psychiatric/Behavioral: Negative.     Objective:  BP 120/70   Pulse (!) 101   Ht 5\' 6"  (1.676 m)   Wt 144 lb 2 oz (65.4 kg)   SpO2 97%   BMI 23.26 kg/m   BP Readings from Last 3 Encounters:  07/29/18 120/70  05/28/18 (!) 157/82  09/24/17 127/79    Wt Readings from Last 3 Encounters:  07/29/18 144 lb 2 oz (65.4 kg)  05/28/18 147 lb 3.2 oz (66.8 kg)  09/24/17 145 lb (65.8 kg)    Physical Exam  Constitutional: He is oriented to person, place, and time. He appears well-developed and well-nourished.  HENT:  Head: Normocephalic and atraumatic.  Right Ear: External ear normal.  Left Ear: External ear normal.  Mouth/Throat: Oropharynx is clear and moist. No oropharyngeal exudate.  Eyes: Pupils are equal, round, and reactive to light. Conjunctivae and EOM are normal. Right eye exhibits no discharge. Left eye exhibits no discharge. No scleral icterus.  Neck: No JVD present. No tracheal deviation present. No thyromegaly present.  Cardiovascular: Normal rate, regular rhythm and normal heart sounds.  Pulmonary/Chest: Effort normal and breath sounds normal.  Abdominal: Bowel sounds are normal.  Neurological: He is alert and oriented to person, place, and time.  Skin: Skin is warm and dry. Capillary refill takes less than 2 seconds.     Psychiatric: He has a normal mood and affect. His behavior is normal.    Lab Results  Component Value Date   WBC 6.3 07/28/2017   HGB 15.5 07/28/2017   HCT 46.1  07/28/2017   PLT 310 07/28/2017   GLUCOSE 87 07/28/2017   CHOL 194 07/28/2017   TRIG 98 07/28/2017   HDL 54 07/28/2017   LDLCALC 120 (H) 07/28/2017   ALT 11 07/28/2017   AST 17 07/28/2017   NA 142 07/28/2017   K 4.3 07/28/2017   CL 102 07/28/2017   CREATININE 0.91 07/28/2017   BUN 14 07/28/2017   CO2 25 07/28/2017   TSH 2.340 07/28/2017   PSA 9.13 (H) 04/14/2014    Ct Soft Tissue Neck W Contrast  Result Date: 11/20/2017 CLINICAL DATA:  Hoarseness. Drainage. Recent endoscopy. Paranasal sinus disease. Creatinine was obtained on site at [facility] at [address] Results: Creatinine 0.8 mg/dL. EXAM: CT NECK WITH CONTRAST TECHNIQUE: Multidetector CT imaging of the neck was performed using the standard protocol following the bolus administration of intravenous contrast. CONTRAST:  77mL ISOVUE-300 IOPAMIDOL (ISOVUE-300) INJECTION 61% COMPARISON:  Head CT 06/12/2013 FINDINGS: Pharynx and larynx: No evidence of mucosal or submucosal lesion. Salivary glands: Submandibular and parotid glands are normal. Thyroid: Normal Lymph nodes: No enlarged or low-density nodes on either side of the neck. Vascular: Atherosclerotic calcification at both carotid bifurcation regions. Right jugular vein is dominant. Limited intracranial: Normal Visualized orbits: Normal Mastoids and visualized paranasal sinuses: Frontal sinuses are clear. Few scattered opacified ethmoid air cells. Sphenoid sinus is clear. Small fluid level in the left maxillary sinus. Mild mucosal thickening along both maxillary sinus floors. Left maxillary sinus is hypoplastic relative to the right. Skeleton: Ordinary cervical spondylosis. Upper chest: Pleural and parenchymal scarring at both lung apices. Atherosclerosis of the aorta. Other: None IMPRESSION: No evidence of neck mass or lymphadenopathy. Aortic atherosclerosis.  Carotid bifurcation atherosclerosis. Ordinary cervical spondylosis. Few opacified ethmoid air cells. Small amount  of fluid layering dependent in the left maxillary sinus. Mild mucosal thickening along the maxillary sinus floors. Electronically Signed   By: Nelson Chimes M.D.   On: 11/20/2017 13:19    Assessment & Plan:   Callan was seen today for establish care.  Diagnoses and all orders for this visit:  Encounter for health maintenance examination with abnormal findings  Obstructive sleep apnea treated with bilevel positive airway pressure (BiPAP)  Gastroesophageal reflux disease, esophagitis presence not specified  Memory change  Insomnia, persistent  Gout, unspecified cause, unspecified chronicity, unspecified site -     Comprehensive metabolic panel; Future -     Uric acid; Future -     Urinalysis, Routine w reflex microscopic; Future  Elevated LDL cholesterol level -     CBC; Future -     Comprehensive metabolic panel; Future -     Lipid panel; Future  Medication monitoring encounter -     CBC; Future -  Comprehensive metabolic panel; Future  Skin lesion of chest wall -     Ambulatory referral to Dermatology   I am having David Shiner. App Massey "Barnabas Lister" maintain his cycloSPORINE, aspirin, finasteride, multivitamin, NEXIUM, valACYclovir, donepezil, mirtazapine, allopurinol, and Probiotic Product (ALIGN PO).  No orders of the defined types were placed in this encounter.  Patient was given anticipatory guidance for health promotion.  Continue to see neurology for apnea, memory deficit and sleep disorder.  Urology is following him for BPH and elevated PSA.  Discussed better therapies for GERD and probiotics.  He will follow-up with his gastroenterologist for second opinion.  Reluctant to take PPIs.  Dermatology referral for abnormal skin lesion right anterior chest wall.  We will continue allopurinol for gout.  Follow-up: Return in about 3 months (around 10/28/2018), or return fasting for blood work.  Libby Maw, MD

## 2018-07-29 NOTE — Patient Instructions (Signed)

## 2018-07-30 ENCOUNTER — Other Ambulatory Visit (INDEPENDENT_AMBULATORY_CARE_PROVIDER_SITE_OTHER): Payer: PPO

## 2018-07-30 DIAGNOSIS — M109 Gout, unspecified: Secondary | ICD-10-CM | POA: Diagnosis not present

## 2018-07-30 DIAGNOSIS — E7800 Pure hypercholesterolemia, unspecified: Secondary | ICD-10-CM

## 2018-07-30 DIAGNOSIS — Z5181 Encounter for therapeutic drug level monitoring: Secondary | ICD-10-CM | POA: Diagnosis not present

## 2018-07-30 LAB — URINALYSIS, ROUTINE W REFLEX MICROSCOPIC
BILIRUBIN URINE: NEGATIVE
Hgb urine dipstick: NEGATIVE
Ketones, ur: NEGATIVE
Leukocytes, UA: NEGATIVE
NITRITE: NEGATIVE
PH: 6 (ref 5.0–8.0)
RBC / HPF: NONE SEEN (ref 0–?)
SPECIFIC GRAVITY, URINE: 1.025 (ref 1.000–1.030)
Total Protein, Urine: NEGATIVE
Urine Glucose: NEGATIVE
Urobilinogen, UA: 0.2 (ref 0.0–1.0)
WBC, UA: NONE SEEN (ref 0–?)

## 2018-07-30 LAB — COMPREHENSIVE METABOLIC PANEL
ALBUMIN: 4.6 g/dL (ref 3.5–5.2)
ALT: 12 U/L (ref 0–53)
AST: 14 U/L (ref 0–37)
Alkaline Phosphatase: 72 U/L (ref 39–117)
BUN: 17 mg/dL (ref 6–23)
CHLORIDE: 105 meq/L (ref 96–112)
CO2: 32 meq/L (ref 19–32)
Calcium: 10.9 mg/dL — ABNORMAL HIGH (ref 8.4–10.5)
Creatinine, Ser: 0.77 mg/dL (ref 0.40–1.50)
GFR: 103.02 mL/min (ref >60.00)
Glucose, Bld: 93 mg/dL (ref 70–99)
Potassium: 4.7 mEq/L (ref 3.5–5.1)
Sodium: 145 mEq/L (ref 135–145)
TOTAL PROTEIN: 7.1 g/dL (ref 6.0–8.3)
Total Bilirubin: 0.6 mg/dL (ref 0.2–1.2)

## 2018-07-30 LAB — CBC
HEMATOCRIT: 43.8 % (ref 39.0–52.0)
HEMOGLOBIN: 14.9 g/dL (ref 13.0–17.0)
MCHC: 34 g/dL (ref 30.0–36.0)
MCV: 89 fl (ref 78.0–100.0)
Platelets: 300 10*3/uL (ref 150.0–400.0)
RBC: 4.91 Mil/uL (ref 4.22–5.81)
RDW: 13.5 % (ref 11.5–15.5)
WBC: 6.7 10*3/uL (ref 4.0–10.5)

## 2018-07-30 LAB — LIPID PANEL
CHOLESTEROL: 171 mg/dL (ref 0–200)
HDL: 47.2 mg/dL (ref >39.00)
LDL Cholesterol: 107 mg/dL — ABNORMAL HIGH (ref 0–99)
NonHDL: 123.68
TRIGLYCERIDES: 85 mg/dL (ref 0.0–149.0)
Total CHOL/HDL Ratio: 4
VLDL: 17 mg/dL (ref 0.0–40.0)

## 2018-07-30 LAB — URIC ACID: Uric Acid, Serum: 4.8 mg/dL (ref 4.0–7.8)

## 2018-08-05 DIAGNOSIS — G4733 Obstructive sleep apnea (adult) (pediatric): Secondary | ICD-10-CM | POA: Diagnosis not present

## 2018-08-06 ENCOUNTER — Ambulatory Visit (INDEPENDENT_AMBULATORY_CARE_PROVIDER_SITE_OTHER): Payer: PPO | Admitting: Plastic Surgery

## 2018-08-06 ENCOUNTER — Encounter: Payer: Self-pay | Admitting: Plastic Surgery

## 2018-08-06 VITALS — BP 118/62 | HR 73 | Resp 15 | Ht 66.0 in | Wt 145.0 lb

## 2018-08-06 DIAGNOSIS — L989 Disorder of the skin and subcutaneous tissue, unspecified: Secondary | ICD-10-CM | POA: Diagnosis not present

## 2018-08-06 MED ORDER — CLOBETASOL PROPIONATE 0.05 % EX OINT: 1.0000 "application " | TOPICAL_OINTMENT | Freq: Two times a day (BID) | CUTANEOUS | 0 refills | Status: DC

## 2018-08-06 NOTE — Progress Notes (Signed)
   Subjective:    Patient ID: [name], male    DOB: 10-13-1935, 83 y.o.   MRN: [id number]  The patient is a 83 year old white male here for evaluation of a skin lesion on the right anterior thorax area.  It is located on the right upper breast area.  He has had this for several months.  He underwent a biopsy approximately 3 months ago and it was negative for skin cancer.  It has continued to be scaly, itch, and has gotten larger.  This area does look concerning for a basal cell with some irregularities.  She has not placed anything on it in the past several months.  He does have a history of hypertrophic scarring.  He is otherwise in stable health.  The area is approximately 1.5 x 1.5 cm.  Nothing seems to improve the area.     Review of Systems  Constitutional: Negative.   HENT: Negative.   Eyes: Negative.   Respiratory: Negative.   Cardiovascular: Negative for chest pain.  Skin: Positive for rash.  Hematological: Negative.    Current Outpatient Medications:  .  allopurinol (ZYLOPRIM) 100 MG tablet, TAKE TWO TABLETS BY MOUTH DAILY , Disp: 60 tablet, Rfl: 0 .  aspirin 81 MG tablet, Take 81 mg by mouth daily.  , Disp: , Rfl:  .  clobetasol ointment (TEMOVATE) 3.40 %, Apply 1 application topically 2 (two) times daily. Apply to effected area twice a day, Disp: 30 g, Rfl: 0 .  cycloSPORINE (RESTASIS) 0.05 % ophthalmic emulsion, 1 drop 2 (two) times daily. As needed. , Disp: , Rfl:  .  donepezil (ARICEPT) 10 MG tablet, Take 1 tablet (10 mg total) by mouth at bedtime., Disp: 90 tablet, Rfl: 3 .  finasteride (PROSCAR) 5 MG tablet, Take 5 mg by mouth daily., Disp: , Rfl:  .  mirtazapine (REMERON) 45 MG tablet, take 1 tablet by mouth at bedtime, Disp: 90 tablet, Rfl: 1 .  Multiple Vitamin (MULTIVITAMIN) capsule, Take 1 capsule by mouth daily., Disp: , Rfl:  .  NEXIUM 40 MG capsule, TAKE 1 CAPSULE BY MOUTH DAILY BEFORE BREAKFAST., Disp: 90 capsule, Rfl: 6 .  Probiotic Product (ALIGN PO),  Take 1 capsule by mouth daily., Disp: , Rfl:  .  valACYclovir (VALTREX) 1000 MG tablet, TAKE 1/2 TABLET BY MOUTH ONCE DAILY, Disp: 45 tablet, Rfl: 3     Objective:   Physical Exam  Constitutional: He appears well-developed and well-nourished.  HENT:  Head: Normocephalic and atraumatic.  Eyes: Pupils are equal, round, and reactive to light. EOM are normal.  Cardiovascular: Normal rate.  Pulmonary/Chest: Effort normal.    Neurological: He is alert.  Psychiatric: He has a normal mood and affect. His behavior is normal.   Vitals:   08/06/18 2014  BP: 118/62  Pulse: 73  Resp: 15  SpO2: 97%  Weight: 145 lb (65.8 kg)  Height: 5\' 6"  (1.676 m)      Assessment & Plan:  Benign skin lesion of thoracic region  Plan on 2 weeks of Clobetasol treatment.  If no improvement than excision or at least another biopsy.  Patient in agreement.

## 2018-08-25 DIAGNOSIS — Z961 Presence of intraocular lens: Secondary | ICD-10-CM | POA: Diagnosis not present

## 2018-08-25 DIAGNOSIS — H40053 Ocular hypertension, bilateral: Secondary | ICD-10-CM | POA: Diagnosis not present

## 2018-08-25 DIAGNOSIS — H16223 Keratoconjunctivitis sicca, not specified as Sjogren's, bilateral: Secondary | ICD-10-CM | POA: Diagnosis not present

## 2018-08-26 DIAGNOSIS — G4733 Obstructive sleep apnea (adult) (pediatric): Secondary | ICD-10-CM | POA: Diagnosis not present

## 2018-08-28 ENCOUNTER — Ambulatory Visit (INDEPENDENT_AMBULATORY_CARE_PROVIDER_SITE_OTHER): Payer: PPO | Admitting: Plastic Surgery

## 2018-08-28 VITALS — Resp 14

## 2018-08-28 DIAGNOSIS — L989 Disorder of the skin and subcutaneous tissue, unspecified: Secondary | ICD-10-CM

## 2018-08-29 ENCOUNTER — Encounter: Payer: Self-pay | Admitting: Plastic Surgery

## 2018-08-29 NOTE — Progress Notes (Signed)
   Subjective:    Patient ID: [name], male    DOB: [birth year], 83 y.o.   MRN: [id number]  The patient is an 83 yrs old wf here for follow up on his right chest skin lesion.  He has been using the steroid cream daily since the last visit.  He is very pleased with the improvement.  The lesion has cleared.  He has mild redness but no further lesion.  He has been doing well and not had any recent illnesses.   Review of Systems  Constitutional: Negative.   HENT: Negative.   Eyes: Negative.   Respiratory: Negative.   Cardiovascular: Negative.   Genitourinary: Negative.   Musculoskeletal: Negative.   Skin: Positive for color change. Negative for wound.  Hematological: Negative.   Psychiatric/Behavioral: Negative.        Objective:   Physical Exam  Constitutional: He is oriented to person, place, and time. He appears well-developed and well-nourished.  HENT:  Head: Normocephalic and atraumatic.  Eyes: Pupils are equal, round, and reactive to light. EOM are normal.  Cardiovascular: Normal rate.  Pulmonary/Chest: Effort normal.  Neurological: He is alert and oriented to person, place, and time.  Skin: Skin is warm.  Psychiatric: He has a normal mood and affect. His behavior is normal. Judgment and thought content normal.      Assessment & Plan:  Benign skin lesion of thoracic region  Recommend vaseline to the area as needed and continue follow up with the dermatologist.  If the area appears again I would like to do another biopsy.  The patient agrees with this plan.

## 2018-09-14 ENCOUNTER — Other Ambulatory Visit: Payer: Self-pay | Admitting: Neurology

## 2018-09-21 ENCOUNTER — Encounter: Payer: Self-pay | Admitting: Neurology

## 2018-09-24 ENCOUNTER — Ambulatory Visit (INDEPENDENT_AMBULATORY_CARE_PROVIDER_SITE_OTHER): Payer: PPO | Admitting: Neurology

## 2018-09-24 ENCOUNTER — Encounter: Payer: Self-pay | Admitting: Neurology

## 2018-09-24 VITALS — BP 147/70 | HR 80 | Ht 66.0 in | Wt 149.0 lb

## 2018-09-24 DIAGNOSIS — G4733 Obstructive sleep apnea (adult) (pediatric): Secondary | ICD-10-CM | POA: Diagnosis not present

## 2018-09-24 DIAGNOSIS — K219 Gastro-esophageal reflux disease without esophagitis: Secondary | ICD-10-CM

## 2018-09-24 DIAGNOSIS — R0681 Apnea, not elsewhere classified: Secondary | ICD-10-CM | POA: Diagnosis not present

## 2018-09-24 NOTE — Addendum Note (Signed)
Addended by: Larey Seat on: 09/24/2018 10:03 AM   Modules accepted: Orders

## 2018-09-24 NOTE — Progress Notes (Signed)
[facility]  PATIENT: Carold Eisner Joyce III DOB: 1935/07/24   REASON FOR VISIT: Follow-up obstructive sleep apnea on BiPAP HISTORY FROM: Patient alone.     HISTORY OF PRESENT ILLNESS: I have the pleasure of meeting today with Mr.Landers "Barnabas Lister" Frappier III, who has followed as here for BiPAP compliance for long time.  The patient presents again with excellent compliance of 97%, 29 out of 30 days with an average user time of 6 hours 35 minutes, inspiratory pressure is set to 13 expiratory pressure to 10 cmH2O with a residual AHI of 19.8 obstructive apneas.  There were no air leaks and I can really not explained this  high apnea number.  He reports that it is not troublesome for him to breathe, he is compliant because he likes to use his BiPAP, and I wonder if we have to increase his pressure by about 2 cmH2O.  Last AHI was 14/h.  He reports having GERD and related apneas are a possibility, he had esophageal stretching procedures with GI. He remains hoarse.  I like for him to have a new BiPAP titration.      UPDATE 05/28/2018 CM Mr Poullard, 83 year old male returns for follow-up for BiPAP compliance.  He continues to have compliance greater than 4 hours at 83%.  Average usage 6 hours 5 minutes.  Set pressure 9 to 13 cm.  Leak 95th percentile 4.1 AHI 14.4/h  which is much improved from last visit.  No central apneas, his  ESS 4/ 24 .  He still says he is not sleeping enough that he did get a wedge and that has helped with his GERD.   He returns for reevaluation  Interval history from 24 September 2017,CD I have the pleasure of meeting today with Mr. Prieur after a one-year hiatus.  The patient has been followed for CPAP compliance in our office for several years now.  He has a high compliance again 87% of the time, average use of time is 5 hours and 31 minutes, he is using a VPAP auto machine inspiratory pressure is set at 19 cm water expiratory pressure at 15, he does not have  detectable air leaks but still high a number of obstructive apneas which has bothered me for years.  He has not noticed a difference between BiPAP and CPAP.  There has been a trend over the last 30 days to higher AHI is from about 10 on average each night to almost 40, and this is not corresponding to air leaks, usage time there has been no adjustment in pressure. The BiPAP's consistently good airflow pattern and data for respiratory rate contradict the measured residual AHI of over 30. He has severe GERD- could that be the  Artefact ? - yes, oesophageal pressure will induce a reading of OSA, he is hoarse , too.  He should change to an elevated pillow, wedge .      UPDATE 9/28 /2018CM Mr.Stucke, 83 year old male returns for follow-up with history of obstructive sleep apnea on BiPAP. He feels he is doing well however compliance data actually worse than 3 months ago. Data dated 07/02/2017 through 9/27/ 2018 Shows 90% compliance greater than 4 hours. Average usage 6 hours 18 minutes. Pressure set 13-17 cm. AHI 32.7 central apneas 0.3. Obstructive apneas 29.2. Patient also feels he has a Leak however this is not evident on the report.Marland Kitchen He is wondering if his Remeron can be increased. He returns for reevaluation    UPDATE 6/28/ 2018CM Mr. Hengel,  83 year old male returns for follow-up with history of obstructive sleep apnea. He was on CPAP when last seen he was switched to BiPAP due to high residual apnea index. He feels that he is doing well on BiPAP Compliance data reviewed today for 30 days from 03/31/2017 2 04/29/2017 100% compliance for 30 days greater than 4 hours. Average usage 7 hours 14 minutes. Pressure set 16 cm/12 cm of pressure. AHI 21.3. Central apneas 0.3. Obstructive apneas 17.2. He returns to discuss this download.  01/30/17 MMMr. Wagoner is a 83 year old male with a history of memory disturbance and obstructive sleep apnea on CPAP. He returns today for a compliance download. At the last  visit his pressure was increased. His download indicates that he uses his machine 60/60 days for compliance of 100%. He uses machine greater than 4 hours 32/60 days for compliance of 53%. On average he uses his machine 4 hours and 7 minutes. He is on auto set with a minimum pressure of 6 cm water and maximum pressure of 20 cm of water with EPR 2. His average pressure is 18.7 cm/m. His residual AHI is 25.1. He returns today to discuss his download.   11/25/2016: Mr. Orman is a 83 year old male with a history of memory disturbance and obstructive sleep apnea on CPAP. He returns today for follow-up. The patient's CPAP download indicates that he uses machine 28 out of 30 days for compliance of 93%. He only uses machine greater than 4 hour 12 out of 30 days for compliance of 40%. His residual AHI is 24.8 on a minimum pressure of 6cm of water with a maximum pressure 15 cmof water. His leak in the 95th percentile is 24.9 L/m. The patient's last download was approximately 2 years ago. At that time the download showed excellent treatment of his apnea. The patient feels that his memory has remained stable. He livesat home alone. He is able to complete all ADLs independently. He operates a Teacher, music without difficulty. He is able to manage his finances and prepares all meals without difficulty. He returns today for an evaluation.   REVIEW OF SYSTEMS: Full 14 system review of systems performed and notable only for those listed, all others are neg:  Constitutional: neg  Cardiovascular: neg Ear/Nose/Throat: neg  Skin: neg Eyes: neg Respiratory: neg Gastroitestinal: neg  Hematology/Lymphatic: neg  Endocrine: neg Musculoskeletal:neg Allergy/Immunology: neg Neurological: neg Psychiatric: neg Sleep : Obstructive sleep apnea with BiPAP   ALLERGIES: Allergies  Allergen Reactions  . Sulfamethoxazole Other (See Comments)    Constipation  . Sulfamethoxazole-Trimethoprim     constipation    HOME  MEDICATIONS: Outpatient Medications Prior to Visit  Medication Sig Dispense Refill  . allopurinol (ZYLOPRIM) 100 MG tablet TAKE TWO TABLETS BY MOUTH DAILY  60 tablet 0  . aspirin 81 MG tablet Take 81 mg by mouth daily.      . clobetasol ointment (TEMOVATE) 1.61 % Apply 1 application topically 2 (two) times daily. Apply to effected area twice a day 30 g 0  . cycloSPORINE (RESTASIS) 0.05 % ophthalmic emulsion 1 drop 2 (two) times daily. As needed.     . donepezil (ARICEPT) 10 MG tablet Take 1 tablet (10 mg total) by mouth at bedtime. 90 tablet 3  . finasteride (PROSCAR) 5 MG tablet Take 5 mg by mouth daily.    . mirtazapine (REMERON) 45 MG tablet TAKE ONE TABLET BY MOUTH AT BEDTIME  90 tablet 0  . Multiple Vitamin (MULTIVITAMIN) capsule Take 1 capsule by mouth daily.    Marland Kitchen  NEXIUM 40 MG capsule TAKE 1 CAPSULE BY MOUTH DAILY BEFORE BREAKFAST. 90 capsule 6  . Probiotic Product (ALIGN PO) Take 1 capsule by mouth daily.    . valACYclovir (VALTREX) 1000 MG tablet TAKE 1/2 TABLET BY MOUTH ONCE DAILY 45 tablet 3   No facility-administered medications prior to visit.     PAST MEDICAL HISTORY: Past Medical History:  Diagnosis Date  . Barrett's esophagus   . Cataract   . Diverticulosis of colon (without mention of hemorrhage)   . Elevated PSA   . Esophageal stricture   . GERD (gastroesophageal reflux disease)   . Glaucoma   . Gout   . Herpes   . Hiatal hernia   . History of colon cancer   . HOH (hard of hearing)   . Hyperlipidemia   . Hypersomnia, persistent 11/24/2013   Reports Epworth of 10 and higher on CPAP with  good compliance.   . Hypertrophy of prostate without urinary obstruction and other lower urinary tract symptoms (LUTS)   . Impotence of organic origin   . Insomnia   . Memory change 04/19/2013  . OSA on CPAP   . Prostate cancer ([place])   . Prostatitis   . Stroke ([place])    tia in  2010  . TIA (transient ischemic attack)   . Vertigo     PAST SURGICAL HISTORY: Past Surgical  History:  Procedure Laterality Date  . EYE SURGERY     both cataracts  . INTRACAPSULAR CATARACT EXTRACTION     Lt eye  . MASS EXCISION N/A 07/29/2013   Procedure: MINOR EXCISION OF CHEST KELOID CONTRACTURE;  Surgeon: Theodoro Kos, DO;  Location: [facility];  Service: Plastics;  Laterality: N/A;  ACELL placement  . MINOR GRAFT APPLICATION N/A 2/50/5397   Procedure: MINOR GRAFT APPLICATION A CELL;  Surgeon: Theodoro Kos, DO;  Location: [facility];  Service: Plastics;  Laterality: N/A;  . REFRACTIVE SURGERY      FAMILY HISTORY: Family History  Problem Relation Age of Onset  . Cirrhosis Mother   . Jaundice Mother   . Hyperlipidemia Father   . Heart disease Father   . Colon cancer Neg Hx     SOCIAL HISTORY: Social History   Socioeconomic History  . Marital status: Single    Spouse name: Not on file  . Number of children: 2  . Years of education: 25  . Highest education level: Not on file  Occupational History  . Occupation: Retired  Scientific laboratory technician  . Financial resource strain: Not on file  . Food insecurity:    Worry: Not on file    Inability: Not on file  . Transportation needs:    Medical: Not on file    Non-medical: Not on file  Tobacco Use  . Smoking status: Former Smoker    Last attempt to quit: 07/23/1962    Years since quitting: 56.2  . Smokeless tobacco: Never Used  . Tobacco comment: Stopped in 1963  Substance and Sexual Activity  . Alcohol use: Yes    Alcohol/week: 14.0 standard drinks    Types: 14 Standard drinks or equivalent per week    Comment: 2-3 drinks nightly  . Drug use: No  . Sexual activity: Yes    Comment: 5 partners  Lifestyle  . Physical activity:    Days per week: Not on file    Minutes per session: Not on file  . Stress: Not on file  Relationships  . Social connections:  Talks on phone: Not on file    Gets together: Not on file    Attends religious service: Not on file    Active member of club or  organization: Not on file    Attends meetings of clubs or organizations: Not on file    Relationship status: Not on file  . Intimate partner violence:    Fear of current or ex partner: Not on file    Emotionally abused: Not on file    Physically abused: Not on file    Forced sexual activity: Not on file  Other Topics Concern  . Not on file  Social History Narrative   Patient is single.   Patient has two children.   Patient is retired.   Patient has a college education.   Patient is right-handed.   Patient drinks about 3 cups of coffee daily.   Patient gets regular exercise.     PHYSICAL EXAM  Vitals:   09/24/18 0919  BP: (!) 147/70  Pulse: 80  Weight: 149 lb (67.6 kg)  Height: 5\' 6"  (1.676 m)   Body mass index is 24.05 kg/m.  Generalized: Well developed, in no acute distress  Head: normocephalic and atraumatic,. Oropharynx benign mallopatti 2-3 Neck: Supple, circumference 14 inches Musculoskeletal: No deformity  Skin no edema Neurological examination   Mentation: Alert oriented to time, place, history taking. Attention span and concentration appropriate. Recent and remote memory intact.  Follows all commands speech and language fluent.   Cranial nerve II-XII: .Pupils were equal round reactive to light extraocular movements were full, visual field were full on confrontational test. Facial sensation and strength were normal. hearing was intact to finger rubbing bilaterally. Uvula tongue midline. head turning and shoulder shrug were normal and symmetric.Tongue protrusion into cheek strength was normal. Motor: normal bulk and tone, full strength in the BUE, BLE,  Sensory: normal and symmetric to light touch,  Coordination: finger-nose-finger, heel-to-shin bilaterally, no dysmetria Gait and Station: Rising up from seated position without assistance, normal stance,  moderate stride, good arm swing, smooth turning, able to perform tiptoe, and heel walking without difficulty.  Tandem gait is steady, no assistive device  DIAGNOSTIC DATA (LABS, IMAGING, TESTING) - I reviewed patient records, labs, notes, testing and imaging myself where available.  Lab Results  Component Value Date   WBC 6.7 07/30/2018   HGB 14.9 07/30/2018   HCT 43.8 07/30/2018   MCV 89.0 07/30/2018   PLT 300.0 07/30/2018      Component Value Date/Time   NA 145 07/30/2018 0851   NA 142 07/28/2017 1504   K 4.7 07/30/2018 0851   CL 105 07/30/2018 0851   CO2 32 07/30/2018 0851   GLUCOSE 93 07/30/2018 0851   BUN 17 07/30/2018 0851   BUN 14 07/28/2017 1504   CREATININE 0.77 07/30/2018 0851   CREATININE 0.78 07/24/2016 1432   CALCIUM 10.9 (H) 07/30/2018 0851   PROT 7.1 07/30/2018 0851   PROT 7.0 07/28/2017 1504   ALBUMIN 4.6 07/30/2018 0851   ALBUMIN 4.7 07/28/2017 1504   AST 14 07/30/2018 0851   ALT 12 07/30/2018 0851   ALKPHOS 72 07/30/2018 0851   BILITOT 0.6 07/30/2018 0851   BILITOT 0.5 07/28/2017 1504   GFRNONAA 79 07/28/2017 1504   GFRNONAA 87 04/18/2015 0826   GFRAA 92 07/28/2017 1504   GFRAA >89 04/18/2015 0826   Lab Results  Component Value Date   CHOL 171 07/30/2018   HDL 47.20 07/30/2018   LDLCALC 107 (H) 07/30/2018  TRIG 85.0 07/30/2018   CHOLHDL 4 07/30/2018    Lab Results  Component Value Date   QITUYWXI37 955 07/24/2016   Lab Results  Component Value Date   TSH 2.340 07/28/2017      ASSESSMENT AND PLAN  83 y.o. year old male  has a past medical history Obstructive sleep apnea on BiPAP.BiPAP data reviewed 04/27/2018 -05/26/2018 shows compliance greater than 4 hours at 83%.  Average usage 6 hours 5 minutes.  Set pressure 9 to 13 cm.  Leak 95th percentile 4.1.  AHI 14.4 ESS 4  PLAN:  Will increase pressure to 13/10 cm   We will order supplies F/U in 12 months, already has appt   [facility] [address] [place], [place] [address] [phone]

## 2018-09-24 NOTE — Patient Instructions (Signed)
We may have to re-titrate to BiPAP as your current machines settings do not control apnea well anymore.  Another explanation for the High AHI may be acid reflux induced apneas.   CC; Dr. Carlean Purl

## 2018-09-30 ENCOUNTER — Other Ambulatory Visit: Payer: Self-pay | Admitting: Neurology

## 2018-09-30 DIAGNOSIS — Z9989 Dependence on other enabling machines and devices: Secondary | ICD-10-CM

## 2018-09-30 DIAGNOSIS — G4733 Obstructive sleep apnea (adult) (pediatric): Secondary | ICD-10-CM

## 2018-11-10 ENCOUNTER — Ambulatory Visit (INDEPENDENT_AMBULATORY_CARE_PROVIDER_SITE_OTHER): Payer: PPO | Admitting: Gastroenterology

## 2018-11-10 ENCOUNTER — Encounter: Payer: Self-pay | Admitting: Gastroenterology

## 2018-11-10 ENCOUNTER — Encounter

## 2018-11-10 VITALS — BP 128/60 | HR 80 | Ht 65.5 in | Wt 147.2 lb

## 2018-11-10 DIAGNOSIS — K219 Gastro-esophageal reflux disease without esophagitis: Secondary | ICD-10-CM | POA: Diagnosis not present

## 2018-11-10 MED ORDER — PANTOPRAZOLE SODIUM 40 MG PO TBEC: 40.0000 mg | DELAYED_RELEASE_TABLET | Freq: Every day | ORAL | 11 refills | Status: DC

## 2018-11-10 NOTE — Patient Instructions (Signed)
We have sent the following medications to your pharmacy for you to pick up at your convenience: pantoprazole.   Patient advised to avoid spicy, acidic, citrus, chocolate, mints, fruit and fruit juices.  Limit the intake of caffeine, alcohol and Soda.  Don't exercise too soon after eating.  Don't lie down within 3-4 hours of eating.  Elevate the head of your bed.   You have been scheduled for an endoscopy. Please follow written instructions given to you at your visit today. If you use inhalers (even only as needed), please bring them with you on the day of your procedure. Your physician has requested that you go to www.startemmi.com and enter the access code given to you at your visit today. This web site gives a general overview about your procedure. However, you should still follow specific instructions given to you by our office regarding your preparation for the procedure.  Thank you for choosing me and [facility].  Malcolm T. Stark, Jr., MD., FACG   

## 2018-11-10 NOTE — Progress Notes (Signed)
History of Present Illness: This is an 84 year old male referred by Dohmeier, Asencion Partridge, MD for the evaluation of GERD. This is a chronic problem with suspected LPR. EGD in 03/2010 showed a small hiatal hernia.  He was recently evaluated by Dr. [name] Fairy for his sleep apnea and although his CPAP appears to be appropriately adjusted and he is compliant he has persistent apneic episodes.  Patient states he discontinued taking PPIs about 2 years ago.  He states he has intermittent heartburn symptoms.  He relates frequent morning hoarseness. He currently takes a probiotic and a Tums daily to control symptoms. Denies weight loss, abdominal pain, constipation, diarrhea, change in stool caliber, melena, hematochezia, nausea, vomiting, dysphagia, chest pain.      Allergies  Allergen Reactions  . Sulfamethoxazole Other (See Comments)    Constipation  . Sulfamethoxazole-Trimethoprim     constipation   Outpatient Medications Prior to Visit  Medication Sig Dispense Refill  . allopurinol (ZYLOPRIM) 100 MG tablet TAKE TWO TABLETS BY MOUTH DAILY  60 tablet 0  . aspirin 81 MG tablet Take 81 mg by mouth daily.      . clobetasol ointment (TEMOVATE) 4.09 % Apply 1 application topically 2 (two) times daily. Apply to effected area twice a day 30 g 0  . cycloSPORINE (RESTASIS) 0.05 % ophthalmic emulsion 1 drop 2 (two) times daily. As needed.     . donepezil (ARICEPT) 10 MG tablet Take 1 tablet (10 mg total) by mouth at bedtime. 90 tablet 3  . finasteride (PROSCAR) 5 MG tablet Take 5 mg by mouth daily.    . mirtazapine (REMERON) 45 MG tablet TAKE ONE TABLET BY MOUTH AT BEDTIME  90 tablet 0  . Multiple Vitamin (MULTIVITAMIN) capsule Take 1 capsule by mouth daily.    Marland Kitchen NEXIUM 40 MG capsule TAKE 1 CAPSULE BY MOUTH DAILY BEFORE BREAKFAST. 90 capsule 6  . Probiotic Product (ALIGN PO) Take 1 capsule by mouth daily.    . valACYclovir (VALTREX) 1000 MG tablet TAKE 1/2 TABLET BY MOUTH ONCE DAILY 45 tablet 3   No  facility-administered medications prior to visit.    Past Medical History:  Diagnosis Date  . Barrett's esophagus   . Cataract   . Diverticulosis of colon (without mention of hemorrhage)   . Elevated PSA   . Esophageal stricture   . GERD (gastroesophageal reflux disease)   . Glaucoma   . Gout   . Herpes   . Hiatal hernia   . History of colon cancer   . HOH (hard of hearing)   . Hyperlipidemia   . Hypersomnia, persistent 11/24/2013   Reports Epworth of 10 and higher on CPAP with  good compliance.   . Hypertrophy of prostate without urinary obstruction and other lower urinary tract symptoms (LUTS)   . Impotence of organic origin   . Insomnia   . Memory change 04/19/2013  . OSA on CPAP   . Prostate cancer ([facility])   . Prostatitis   . Stroke ([place] [facility])    tia in  2010  . TIA (transient ischemic attack)   . Vertigo    Past Surgical History:  Procedure Laterality Date  . EYE SURGERY     both cataracts  . INTRACAPSULAR CATARACT EXTRACTION     Lt eye  . MASS EXCISION N/A 07/29/2013   Procedure: MINOR EXCISION OF CHEST KELOID CONTRACTURE;  Surgeon: Theodoro Kos, DO;  Location: [facility];  Service: Plastics;  Laterality: N/A;  ACELL placement  .  MINOR GRAFT APPLICATION N/A 3/66/4403   Procedure: MINOR GRAFT APPLICATION A CELL;  Surgeon: Theodoro Kos, DO;  Location: [facility];  Service: Plastics;  Laterality: N/A;  . REFRACTIVE SURGERY     Social History   Socioeconomic History  . Marital status: Single    Spouse name: Not on file  . Number of children: 2  . Years of education: 69  . Highest education level: Not on file  Occupational History  . Occupation: Retired  Scientific laboratory technician  . Financial resource strain: Not on file  . Food insecurity:    Worry: Not on file    Inability: Not on file  . Transportation needs:    Medical: Not on file    Non-medical: Not on file  Tobacco Use  . Smoking status: Former Smoker    Last attempt to quit: 07/23/1962      Years since quitting: 56.3  . Smokeless tobacco: Never Used  . Tobacco comment: Stopped in 1963  Substance and Sexual Activity  . Alcohol use: Yes    Alcohol/week: 14.0 standard drinks    Types: 14 Standard drinks or equivalent per week    Comment: 2-3 drinks nightly  . Drug use: No  . Sexual activity: Yes    Comment: 5 partners  Lifestyle  . Physical activity:    Days per week: Not on file    Minutes per session: Not on file  . Stress: Not on file  Relationships  . Social connections:    Talks on phone: Not on file    Gets together: Not on file    Attends religious service: Not on file    Active member of club or organization: Not on file    Attends meetings of clubs or organizations: Not on file    Relationship status: Not on file  Other Topics Concern  . Not on file  Social History Narrative   Patient is single.   Patient has two children.   Patient is retired.   Patient has a college education.   Patient is right-handed.   Patient drinks about 3 cups of coffee daily.   Patient gets regular exercise.   Family History  Problem Relation Age of Onset  . Cirrhosis Mother   . Jaundice Mother   . Hyperlipidemia Father   . Heart disease Father   . Colon cancer Neg Hx        Review of Systems: Pertinent positive and negative review of systems were noted in the above HPI section. All other review of systems were otherwise negative.    Physical Exam: General: Well developed, well nourished, no acute distress Head: Normocephalic and atraumatic Eyes:  sclerae anicteric, EOMI Ears: Normal auditory acuity Mouth: No deformity or lesions Neck: Supple, no masses or thyromegaly Lungs: Clear throughout to auscultation Heart: Regular rate and rhythm; no murmurs, rubs or bruits Abdomen: Soft, non tender and non distended. No masses, hepatosplenomegaly or hernias noted. Normal Bowel sounds Rectal: Not done Musculoskeletal: Symmetrical with no gross deformities  Skin: No  lesions on visible extremities Pulses:  Normal pulses noted Extremities: No clubbing, cyanosis, edema or deformities noted Neurological: Alert oriented x 4, grossly nonfocal Cervical Nodes:  No significant cervical adenopathy Inguinal Nodes: No significant inguinal adenopathy Psychological:  Alert and cooperative. Normal mood and affect   Assessment and Recommendations:  1. GERD with suspected LPR.  Potentially nocturnal reflux could be impacting optimal control of sleep apnea.  Recommend long-term treatment with at least a daily  PPI.  Closely follow antireflux measures. Pantoprazole 40 mg po qam. If symptoms not adequately controlled consider an H2 blocker at night or PPI twice daily. Schedule EGD to further evaluate for esophagitis, Barrett's. The risks (including bleeding, perforation, infection, missed lesions, medication reactions and possible hospitalization or surgery if complications occur), benefits, and alternatives to endoscopy with possible biopsy and possible dilation were discussed with the patient and they consent to proceed.   2. Personal history of colon adenocarcinoma in situ in 2005.  Last colonoscopy in 2013 by Dr. Sharlett Iles and showed only diverticulosis.  Due to age no plans for future surveillance colonoscopies.   cc: Dohmeier, Asencion Partridge, MD [address] [place], [place] [address]

## 2018-11-13 ENCOUNTER — Encounter: Payer: Self-pay | Admitting: Gastroenterology

## 2018-11-19 ENCOUNTER — Telehealth: Payer: Self-pay | Admitting: Gastroenterology

## 2018-11-19 NOTE — Telephone Encounter (Signed)
Pt is scheduled for egd on 11/26/18 and wants to know if he can have a colon as well. Pls call him.

## 2018-11-19 NOTE — Telephone Encounter (Signed)
Patient notified that per last office note.  Dr. Fuller Plan indicate any further routine procedures.

## 2018-11-26 ENCOUNTER — Encounter: Payer: Self-pay | Admitting: Gastroenterology

## 2018-11-26 ENCOUNTER — Ambulatory Visit (AMBULATORY_SURGERY_CENTER): Payer: PPO | Admitting: Gastroenterology

## 2018-11-26 VITALS — BP 105/53 | HR 76 | Temp 98.4°F | Resp 11 | Ht 65.5 in | Wt 147.0 lb

## 2018-11-26 DIAGNOSIS — Z8673 Personal history of transient ischemic attack (TIA), and cerebral infarction without residual deficits: Secondary | ICD-10-CM | POA: Diagnosis not present

## 2018-11-26 DIAGNOSIS — K219 Gastro-esophageal reflux disease without esophagitis: Secondary | ICD-10-CM | POA: Diagnosis not present

## 2018-11-26 DIAGNOSIS — K2950 Unspecified chronic gastritis without bleeding: Secondary | ICD-10-CM | POA: Diagnosis not present

## 2018-11-26 DIAGNOSIS — K3189 Other diseases of stomach and duodenum: Secondary | ICD-10-CM | POA: Diagnosis not present

## 2018-11-26 DIAGNOSIS — K222 Esophageal obstruction: Secondary | ICD-10-CM | POA: Diagnosis not present

## 2018-11-26 DIAGNOSIS — G4733 Obstructive sleep apnea (adult) (pediatric): Secondary | ICD-10-CM | POA: Diagnosis not present

## 2018-11-26 DIAGNOSIS — R1310 Dysphagia, unspecified: Secondary | ICD-10-CM

## 2018-11-26 DIAGNOSIS — K2970 Gastritis, unspecified, without bleeding: Secondary | ICD-10-CM | POA: Diagnosis not present

## 2018-11-26 MED ORDER — SODIUM CHLORIDE 0.9 % IV SOLN: 500.0000 mL | Freq: Once | INTRAVENOUS | Status: DC

## 2018-11-26 NOTE — Op Note (Signed)
[facility] Patient Name: [name] Procedure Date: 11/26/2018 10:06 AM MRN: [id number] Endoscopist: Ladene Artist , MD Age: 84 Referring MD:  Date of Birth: 01-07-1935 Gender: Male Account #: [id number] Procedure:                Upper GI endoscopy Indications:              Dysphagia, Gastro-esophageal reflux disease Medicines:                Monitored Anesthesia Care Procedure:                Pre-Anesthesia Assessment:                           - Prior to the procedure, a History and Physical                            was performed, and patient medications and                            allergies were reviewed. The patient's tolerance of                            previous anesthesia was also reviewed. The risks                            and benefits of the procedure and the sedation                            options and risks were discussed with the patient.                            All questions were answered, and informed consent                            was obtained. Prior Anticoagulants: The patient has                            taken no previous anticoagulant or antiplatelet                            agents. ASA Grade Assessment: III - A patient with                            severe systemic disease. After reviewing the risks                            and benefits, the patient was deemed in                            satisfactory condition to undergo the procedure.                           After obtaining informed consent, the endoscope was  passed under direct vision. Throughout the                            procedure, the patient's blood pressure, pulse, and                            oxygen saturations were monitored continuously. The                            Endoscope was introduced through the mouth, and                            advanced to the second part of duodenum. The upper                            GI  endoscopy was accomplished without difficulty.                            The patient tolerated the procedure well. Scope In: Scope Out: Findings:                 One benign-appearing, intrinsic mild stenosis was                            found at the gastroesophageal junction. This                            stenosis measured 1.4 cm (inner diameter). The                            stenosis was traversed. A guidewire was placed and                            the scope was withdrawn. Dilation was performed                            with a Savary dilator with mild resistance at 15 mm.                           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                            Biopsies were taken with a cold forceps for                            histology.                           The exam of the esophagus was otherwise normal.  A single localized, small non-bleeding erosion was                            found in the prepyloric region of the stomach.                            There were no stigmata of recent bleeding. Biopsies                            were taken with a cold forceps for histology.                           A small hiatal hernia was present.                           A single umbilicated lesion measuring medium in                            diameter was found in the gastric antrum.                           The exam of the stomach was otherwise normal.                           The duodenal bulb and second portion of the                            duodenum were normal. Complications:            No immediate complications. Estimated Blood Loss:     Estimated blood loss: none. Estimated blood loss                            was minimal. Impression:               - Benign-appearing esophageal stenosis. Dilated.                           - Ectopic gastric  mucosa in the proximal esophagus.                            Biopsied.                           - Non-bleeding erosive gastropathy. Biopsied.                           - Pancreatic rest in antrum.                           - Small hiatal hernia.                           - Normal duodenal bulb and second portion of the  duodenum. Recommendation:           - Patient has a contact number available for                            emergencies. The signs and symptoms of potential                            delayed complications were discussed with the                            patient. Return to normal activities tomorrow.                            Written discharge instructions were provided to the                            patient.                           - Clear liquid diet for 2 hours, then advance as                            tolerated to soft diet today.                           - Continue present medications including                            pantoprazole 40 mg po qam long term.                           - Follow antireflux measures.                           - Await pathology results.                           - Return to GI office in 2 months. Ladene Artist, MD 11/26/2018 10:32:13 AM This report has been signed electronically.

## 2018-11-26 NOTE — Progress Notes (Signed)
Report given to PACU, vss 

## 2018-11-26 NOTE — Patient Instructions (Signed)
Handout on Reflux given, Clear liquids until 12:30, soft foods for the rest of today. Resume regular diet tomorrow.   YOU HAD AN ENDOSCOPIC PROCEDURE TODAY AT [facility]:   Refer to the procedure report that was given to you for any specific questions about what was found during the examination.  If the procedure report does not answer your questions, please call your gastroenterologist to clarify.  If you requested that your care partner not be given the details of your procedure findings, then the procedure report has been included in a sealed envelope for you to review at your convenience later.  YOU SHOULD EXPECT: Some feelings of bloating in the abdomen. Passage of more gas than usual.  Walking can help get rid of the air that was put into your GI tract during the procedure and reduce the bloating. If you had a lower endoscopy (such as a colonoscopy or flexible sigmoidoscopy) you may notice spotting of blood in your stool or on the toilet paper. If you underwent a bowel prep for your procedure, you may not have a normal bowel movement for a few days.  Please Note:  You might notice some irritation and congestion in your nose or some drainage.  This is from the oxygen used during your procedure.  There is no need for concern and it should clear up in a day or so.  SYMPTOMS TO REPORT IMMEDIATELY:    Following upper endoscopy (EGD)  Vomiting of blood or coffee ground material  New chest pain or pain under the shoulder blades  Painful or persistently difficult swallowing  New shortness of breath  Fever of 100F or higher  Black, tarry-looking stools  For urgent or emergent issues, a gastroenterologist can be reached at any hour by calling [phone].   DIET:  Clear liquids for two hours, until 12:30, soft foods for the rest of today. Resume regular diet tomorrow.  Drink plenty of fluids but you should avoid alcoholic beverages for 24 hours.  ACTIVITY:  You should plan  to take it easy for the rest of today and you should NOT DRIVE or use heavy machinery until tomorrow (because of the sedation medicines used during the test).    FOLLOW UP: Our staff will call the number listed on your records the next business day following your procedure to check on you and address any questions or concerns that you may have regarding the information given to you following your procedure. If we do not reach you, we will leave a message.  However, if you are feeling well and you are not experiencing any problems, there is no need to return our call.  We will assume that you have returned to your regular daily activities without incident.  If any biopsies were taken you will be contacted by phone or by letter within the next 1-3 weeks.  Please call us at [phone] if you have not heard about the biopsies in 3 weeks.    SIGNATURES/CONFIDENTIALITY: You and/or your care partner have signed paperwork which will be entered into your electronic medical record.  These signatures attest to the fact that that the information above on your After Visit Summary has been reviewed and is understood.  Full responsibility of the confidentiality of this discharge information lies with you and/or your care-partner.

## 2018-11-26 NOTE — Progress Notes (Signed)
Called to room to assist during endoscopic procedure.  Patient ID and intended procedure confirmed with present staff. Received instructions for my participation in the procedure from the performing physician.  

## 2018-11-27 ENCOUNTER — Telehealth: Payer: Self-pay

## 2018-11-27 NOTE — Telephone Encounter (Signed)
  Follow up Call-  Call back number 11/26/2018  Post procedure Call Back phone  # [phone]  Permission to leave phone message Yes  Some recent data might be hidden     Patient questions:  Do you have a fever, pain , or abdominal swelling? No. Pain Score  0 *  Have you tolerated food without any problems? Yes.    Have you been able to return to your normal activities? Yes.    Do you have any questions about your discharge instructions: Diet   No. Medications  No. Follow up visit  No.  Do you have questions or concerns about your Care? No.  Actions: * If pain score is 4 or above: No action needed, pain <4.

## 2018-11-27 NOTE — Telephone Encounter (Signed)
  Follow up Call-  Call back number 11/26/2018  Post procedure Call Back phone  # [phone]  Permission to leave phone message Yes  Some recent data might be hidden     Left message

## 2018-12-01 DIAGNOSIS — C61 Malignant neoplasm of prostate: Secondary | ICD-10-CM | POA: Diagnosis not present

## 2018-12-02 ENCOUNTER — Encounter: Payer: Self-pay | Admitting: Gastroenterology

## 2018-12-07 DIAGNOSIS — N401 Enlarged prostate with lower urinary tract symptoms: Secondary | ICD-10-CM | POA: Diagnosis not present

## 2018-12-07 DIAGNOSIS — C61 Malignant neoplasm of prostate: Secondary | ICD-10-CM | POA: Diagnosis not present

## 2018-12-07 DIAGNOSIS — R350 Frequency of micturition: Secondary | ICD-10-CM | POA: Diagnosis not present

## 2018-12-07 DIAGNOSIS — N5201 Erectile dysfunction due to arterial insufficiency: Secondary | ICD-10-CM | POA: Diagnosis not present

## 2018-12-09 ENCOUNTER — Telehealth: Payer: Self-pay | Admitting: Neurology

## 2018-12-09 DIAGNOSIS — G4733 Obstructive sleep apnea (adult) (pediatric): Secondary | ICD-10-CM

## 2018-12-09 DIAGNOSIS — R634 Abnormal weight loss: Secondary | ICD-10-CM

## 2018-12-09 DIAGNOSIS — K222 Esophageal obstruction: Secondary | ICD-10-CM

## 2018-12-09 DIAGNOSIS — G4700 Insomnia, unspecified: Secondary | ICD-10-CM

## 2018-12-09 NOTE — Telephone Encounter (Signed)
Pt is calling for the result of his Endoscopy. Please advise.

## 2018-12-09 NOTE — Telephone Encounter (Signed)
Dear [name],   I have reviewed your most recent BiPAP download and see that the residual apneas are all obstructive in nature, you have 11.2 obstructive apneas and only 0.2 central apneas at the current setting of 15/12 cmH2O pressure.  Since these are obstructive apnea I feel confident that we can reduce them by increasing the BiPAP setting I would like to use 17/13 cmH2O.   I also congratulate you on your excellent compliance and on the very few air leaks meaning that the current mask is truly fitting well. We can hold off on the new titration until we see how the changes take hold.     You had asked about a referral to an ear nose and throat physician and I would be most happy with facilitating this.  Sincerely, C. Amahri Dengel MD.

## 2018-12-09 NOTE — Addendum Note (Signed)
Addended by: Larey Seat on: 12/09/2018 04:15 PM   Modules accepted: Orders

## 2018-12-09 NOTE — Telephone Encounter (Signed)
Patient had a Endoscopy completed by Dr Fuller Plan. Pt states he has already received the results of the test but wanted to have Dr Dohmeier review to see if she sees any blockage or reason for the complications with Bipap. Patient also wants to know if he should be evaluated by ENT first prior to having Bipap titration. Patient is hesitant to complete Bipap titration until he makes sure there is no blockage. Patient is asking if Dr [name] Fairy can call him to discuss her thoughts on the matter. Advised the patient I will make her aware and have her review the endoscopy procedure notes and office notes and see what she recommends.Pt verbalized understanding.

## 2018-12-10 ENCOUNTER — Telehealth: Payer: Self-pay | Admitting: Neurology

## 2018-12-10 NOTE — Addendum Note (Signed)
Addended by: Darleen Crocker on: 12/10/2018 02:16 PM   Modules accepted: Orders

## 2018-12-10 NOTE — Telephone Encounter (Signed)
Called the patient back. He got my message and understood but wanted to request the ENT referral go to Dr Janace Hoard. Order placed for the patient.

## 2018-12-10 NOTE — Telephone Encounter (Signed)
Pt states he missed a call from RN, he is asking for a call back

## 2018-12-10 NOTE — Telephone Encounter (Signed)
Pt was not scheduled for apt today. This was not meant for his chart.   ERROR

## 2018-12-10 NOTE — Telephone Encounter (Signed)
Called the patient to discuss there was no answer. Left a detailed message informing the patient that Dr [name] Fairy was fine with placing a ENT referral for him to see if there was any obstruction issues. I also informed him she looked at the endoscopy results and didn't see anything of concern on sleep end that could be causing the problems. Advised the patient I had Dr Dohmeier review his most recent 30 day download on his machine and she noted the patient to have quite a few obstructive apnea vs the central apnea. She made a change to his machine pressures with the hope this may help. I have sent that order over [facility] for them to make the necessary changes to his machine to hopefully help. Advised the patient ill place the ENT referral and If has any questions to call back.

## 2018-12-10 NOTE — Telephone Encounter (Signed)
Patient was no show to apt today. The weather was bad.

## 2018-12-11 NOTE — Telephone Encounter (Signed)
Dr. Berle Mull office called back and stated that this did not seem like an issue Dr. Ernesto Rutherford could treat and has suggested we refer to a different specialist. Please advise.

## 2018-12-15 NOTE — Telephone Encounter (Signed)
Referral was sent to Dr Janace Hoard ENT. Will wait to hear if they can assist

## 2018-12-18 DIAGNOSIS — G4733 Obstructive sleep apnea (adult) (pediatric): Secondary | ICD-10-CM | POA: Diagnosis not present

## 2018-12-22 ENCOUNTER — Other Ambulatory Visit: Payer: Self-pay | Admitting: Neurology

## 2018-12-29 DIAGNOSIS — G4733 Obstructive sleep apnea (adult) (pediatric): Secondary | ICD-10-CM | POA: Diagnosis not present

## 2019-01-26 ENCOUNTER — Ambulatory Visit: Payer: PPO | Admitting: Gastroenterology

## 2019-02-22 ENCOUNTER — Other Ambulatory Visit: Payer: Self-pay | Admitting: Urology

## 2019-02-22 DIAGNOSIS — C61 Malignant neoplasm of prostate: Secondary | ICD-10-CM

## 2019-02-26 DIAGNOSIS — G4733 Obstructive sleep apnea (adult) (pediatric): Secondary | ICD-10-CM | POA: Diagnosis not present

## 2019-05-10 DIAGNOSIS — C61 Malignant neoplasm of prostate: Secondary | ICD-10-CM | POA: Diagnosis not present

## 2019-05-17 ENCOUNTER — Ambulatory Visit
Admission: RE | Admit: 2019-05-17 | Discharge: 2019-05-17 | Disposition: A | Discharge status: Home / Self Care | Payer: PPO | Source: Ambulatory Visit | Attending: Urology | Admitting: Urology

## 2019-05-17 ENCOUNTER — Other Ambulatory Visit: Payer: Self-pay

## 2019-05-17 DIAGNOSIS — C61 Malignant neoplasm of prostate: Secondary | ICD-10-CM | POA: Diagnosis not present

## 2019-05-17 MED ORDER — GADOBENATE DIMEGLUMINE 529 MG/ML IV SOLN
13.0000 mL | Freq: Once | INTRAVENOUS | Status: AC | PRN
  Administered 2019-05-17: 13 mL via INTRAVENOUS

## 2019-06-21 DIAGNOSIS — N401 Enlarged prostate with lower urinary tract symptoms: Secondary | ICD-10-CM | POA: Diagnosis not present

## 2019-06-21 DIAGNOSIS — C61 Malignant neoplasm of prostate: Secondary | ICD-10-CM | POA: Diagnosis not present

## 2019-06-21 DIAGNOSIS — R3121 Asymptomatic microscopic hematuria: Secondary | ICD-10-CM | POA: Diagnosis not present

## 2019-06-21 DIAGNOSIS — N5201 Erectile dysfunction due to arterial insufficiency: Secondary | ICD-10-CM | POA: Diagnosis not present

## 2019-07-23 ENCOUNTER — Other Ambulatory Visit: Payer: Self-pay | Admitting: Neurology

## 2019-07-27 ENCOUNTER — Other Ambulatory Visit: Payer: Self-pay

## 2019-07-29 ENCOUNTER — Encounter: Payer: Self-pay | Admitting: Family Medicine

## 2019-07-29 ENCOUNTER — Ambulatory Visit (INDEPENDENT_AMBULATORY_CARE_PROVIDER_SITE_OTHER): Payer: PPO | Admitting: Family Medicine

## 2019-07-29 ENCOUNTER — Other Ambulatory Visit: Payer: Self-pay

## 2019-07-29 VITALS — BP 110/70 | HR 88 | Ht 65.5 in | Wt 142.0 lb

## 2019-07-29 DIAGNOSIS — Z23 Encounter for immunization: Secondary | ICD-10-CM

## 2019-07-29 DIAGNOSIS — E7800 Pure hypercholesterolemia, unspecified: Secondary | ICD-10-CM

## 2019-07-29 DIAGNOSIS — Z0000 Encounter for general adult medical examination without abnormal findings: Secondary | ICD-10-CM | POA: Diagnosis not present

## 2019-07-29 DIAGNOSIS — M109 Gout, unspecified: Secondary | ICD-10-CM | POA: Diagnosis not present

## 2019-07-29 LAB — LIPID PANEL
Cholesterol: 174 mg/dL (ref 0–200)
HDL: 48.1 mg/dL (ref >39.00)
LDL Cholesterol: 111 mg/dL — ABNORMAL HIGH (ref 0–99)
NonHDL: 126.23
Total CHOL/HDL Ratio: 4
Triglycerides: 77 mg/dL (ref 0.0–149.0)
VLDL: 15.4 mg/dL (ref 0.0–40.0)

## 2019-07-29 LAB — URINALYSIS, ROUTINE W REFLEX MICROSCOPIC
Bilirubin Urine: NEGATIVE
Hgb urine dipstick: NEGATIVE
Ketones, ur: NEGATIVE
Leukocytes,Ua: NEGATIVE
Nitrite: NEGATIVE
RBC / HPF: NONE SEEN (ref 0–?)
Specific Gravity, Urine: 1.025 (ref 1.000–1.030)
Total Protein, Urine: NEGATIVE
Urine Glucose: NEGATIVE
Urobilinogen, UA: 0.2 (ref 0.0–1.0)
WBC, UA: NONE SEEN (ref 0–?)
pH: 6 (ref 5.0–8.0)

## 2019-07-29 LAB — COMPREHENSIVE METABOLIC PANEL
ALT: 16 U/L (ref 0–53)
AST: 18 U/L (ref 0–37)
Albumin: 4.4 g/dL (ref 3.5–5.2)
Alkaline Phosphatase: 81 U/L (ref 39–117)
BUN: 16 mg/dL (ref 6–23)
CO2: 30 mEq/L (ref 19–32)
Calcium: 10.5 mg/dL (ref 8.4–10.5)
Chloride: 105 mEq/L (ref 96–112)
Creatinine, Ser: 0.81 mg/dL (ref 0.40–1.50)
GFR: 91.2 mL/min (ref >60.00)
Glucose, Bld: 89 mg/dL (ref 70–99)
Potassium: 4.8 mEq/L (ref 3.5–5.1)
Sodium: 144 mEq/L (ref 135–145)
Total Bilirubin: 0.7 mg/dL (ref 0.2–1.2)
Total Protein: 6.7 g/dL (ref 6.0–8.3)

## 2019-07-29 LAB — CBC
HCT: 42.7 % (ref 39.0–52.0)
Hemoglobin: 14.2 g/dL (ref 13.0–17.0)
MCHC: 33.2 g/dL (ref 30.0–36.0)
MCV: 88.9 fl (ref 78.0–100.0)
Platelets: 269 10*3/uL (ref 150.0–400.0)
RBC: 4.8 Mil/uL (ref 4.22–5.81)
RDW: 14.2 % (ref 11.5–15.5)
WBC: 6.9 10*3/uL (ref 4.0–10.5)

## 2019-07-29 LAB — URIC ACID: Uric Acid, Serum: 6 mg/dL (ref 4.0–7.8)

## 2019-07-29 LAB — LDL CHOLESTEROL, DIRECT: Direct LDL: 112 mg/dL

## 2019-07-29 NOTE — Progress Notes (Addendum)
Established Patient Office Visit  Subjective:  Patient ID: [name], male    DOB: Feb 09, 1935  Age: 84 y.o. MRN: [id number]  CC:  Chief Complaint  Patient presents with  . Annual Exam    HPI [name] presents for follow-up of his gout, elevated LDL cholesterol and reflux with esophageal stricture and Barrett's esophagus.  He had told me during the last visit that he was not going to take a PPI but I am glad to see that he has decided to take Protonix.  He now tells me that he has not been taking his allopurinol because he has not had a gouty attack.  He is sheltering at home.  He does not see the dentist because he has upper and lower plates.  He believes that he has an appointment to see the the ophthalmologist sometime later this year.  Past Medical History:  Diagnosis Date  . Barrett's esophagus   . Cataract   . Diverticulosis of colon (without mention of hemorrhage)   . Elevated PSA   . Esophageal stricture   . GERD (gastroesophageal reflux disease)   . Glaucoma   . Gout   . Herpes   . Hiatal hernia   . History of colon cancer   . HOH (hard of hearing)   . Hyperlipidemia   . Hypersomnia, persistent 11/24/2013   Reports Epworth of 10 and higher on CPAP with  good compliance.   . Hypertrophy of prostate without urinary obstruction and other lower urinary tract symptoms (LUTS)   . Impotence of organic origin   . Insomnia   . Memory change 04/19/2013  . OSA on CPAP   . Prostate cancer ([place])   . Prostatitis   . Stroke ([place])    tia in  2010  . TIA (transient ischemic attack)   . Vertigo     Past Surgical History:  Procedure Laterality Date  . EYE SURGERY     both cataracts  . INTRACAPSULAR CATARACT EXTRACTION     Lt eye  . MASS EXCISION N/A 07/29/2013   Procedure: MINOR EXCISION OF CHEST KELOID CONTRACTURE;  Surgeon: Theodoro Kos, DO;  Location: [facility];  Service: Plastics;  Laterality: N/A;  ACELL placement  . MINOR GRAFT  APPLICATION N/A [id number]   Procedure: MINOR GRAFT APPLICATION A CELL;  Surgeon: Theodoro Kos, DO;  Location: [facility];  Service: Plastics;  Laterality: N/A;  . REFRACTIVE SURGERY      Family History  Problem Relation Age of Onset  . Cirrhosis Mother   . Jaundice Mother   . Hyperlipidemia Father   . Heart disease Father   . Colon cancer Neg Hx   . Esophageal cancer Neg Hx   . Rectal cancer Neg Hx   . Stomach cancer Neg Hx     Social History   Socioeconomic History  . Marital status: Single    Spouse name: Not on file  . Number of children: 2  . Years of education: 69  . Highest education level: Not on file  Occupational History  . Occupation: Retired  Scientific laboratory technician  . Financial resource strain: Not on file  . Food insecurity    Worry: Not on file    Inability: Not on file  . Transportation needs    Medical: Not on file    Non-medical: Not on file  Tobacco Use  . Smoking status: Former Smoker    Quit date: 07/23/1962  Years since quitting: 57.1  . Smokeless tobacco: Never Used  . Tobacco comment: Stopped in 1963  Substance and Sexual Activity  . Alcohol use: Yes    Alcohol/week: 14.0 standard drinks    Types: 14 Standard drinks or equivalent per week    Comment: 2-3 drinks nightly  . Drug use: No  . Sexual activity: Yes    Comment: 5 partners  Lifestyle  . Physical activity    Days per week: Not on file    Minutes per session: Not on file  . Stress: Not on file  Relationships  . Social Herbalist on phone: Not on file    Gets together: Not on file    Attends religious service: Not on file    Active member of club or organization: Not on file    Attends meetings of clubs or organizations: Not on file    Relationship status: Not on file  . Intimate partner violence    Fear of current or ex partner: Not on file    Emotionally abused: Not on file    Physically abused: Not on file    Forced sexual activity: Not on file  Other  Topics Concern  . Not on file  Social History Narrative   Patient is single.   Patient has two children.   Patient is retired.   Patient has a college education.   Patient is right-handed.   Patient drinks about 3 cups of coffee daily.   Patient gets regular exercise.    Outpatient Medications Prior to Visit  Medication Sig Dispense Refill  . aspirin 81 MG tablet Take 81 mg by mouth daily.      . cycloSPORINE (RESTASIS) 0.05 % ophthalmic emulsion 1 drop 2 (two) times daily. As needed.     . donepezil (ARICEPT) 10 MG tablet Take 1 tablet (10 mg total) by mouth at bedtime. 90 tablet 3  . finasteride (PROSCAR) 5 MG tablet Take 5 mg by mouth daily.    . mirtazapine (REMERON) 45 MG tablet TAKE 1 TABLET BY MOUTH EVERY NIGHT AT BEDTIME 90 tablet 0  . Multiple Vitamin (MULTIVITAMIN) capsule Take 1 capsule by mouth daily.    Marland Kitchen NEXIUM 40 MG capsule TAKE 1 CAPSULE BY MOUTH DAILY BEFORE BREAKFAST. 90 capsule 6  . pantoprazole (PROTONIX) 40 MG tablet Take 1 tablet (40 mg total) by mouth daily. 30 tablet 11  . Probiotic Product (ALIGN PO) Take 1 capsule by mouth daily.    . valACYclovir (VALTREX) 1000 MG tablet TAKE 1/2 TABLET BY MOUTH ONCE DAILY 45 tablet 3  . allopurinol (ZYLOPRIM) 100 MG tablet TAKE TWO TABLETS BY MOUTH DAILY  60 tablet 0  . clobetasol ointment (TEMOVATE) [id number] % Apply 1 application topically 2 (two) times daily. Apply to effected area twice a day (Patient not taking: Reported on 11/26/2018) 30 g 0   No facility-administered medications prior to visit.     Allergies  Allergen Reactions  . Sulfamethoxazole Other (See Comments)    Constipation  . Sulfamethoxazole-Trimethoprim     constipation    ROS Review of Systems  Constitutional: Negative for chills, diaphoresis, fatigue, fever and unexpected weight change.  HENT: Negative.   Eyes: Negative for photophobia and visual disturbance.  Respiratory: Negative.   Cardiovascular: Negative.   Gastrointestinal: Negative.    Endocrine: Negative for polyphagia and polyuria.  Genitourinary: Negative.   Musculoskeletal: Negative for gait problem and joint swelling.  Skin: Negative for rash and wound.  Neurological:  Negative for speech difficulty and light-headedness.  Hematological: Does not bruise/bleed easily.  Psychiatric/Behavioral: Negative.       Objective:    Physical Exam  Constitutional: He is oriented to person, place, and time. He appears well-developed and well-nourished. No distress.  HENT:  Head: Normocephalic and atraumatic.  Right Ear: External ear normal.  Left Ear: External ear normal.  Mouth/Throat: Oropharynx is clear and moist. No oropharyngeal exudate.  Eyes: Pupils are equal, round, and reactive to light. Conjunctivae are normal. Right eye exhibits no discharge. Left eye exhibits no discharge. No scleral icterus.  Neck: No JVD present. No tracheal deviation present. No thyromegaly present.  Cardiovascular: Normal rate, regular rhythm and normal heart sounds.  Pulmonary/Chest: Effort normal and breath sounds normal. No stridor.  Abdominal: Bowel sounds are normal.  Musculoskeletal:        General: No edema.  Lymphadenopathy:    He has no cervical adenopathy.  Neurological: He is alert and oriented to person, place, and time.  Skin: Skin is warm and dry. He is not diaphoretic.  Psychiatric: He has a normal mood and affect. His behavior is normal.    BP 110/70   Pulse 88   Ht 5' 5.5" (1.664 m)   Wt 142 lb (64.4 kg)   SpO2 98%   BMI 23.27 kg/m  Wt Readings from Last 3 Encounters:  07/29/19 142 lb (64.4 kg)  11/26/18 147 lb (66.7 kg)  11/10/18 147 lb 4 oz (66.8 kg)   BP Readings from Last 3 Encounters:  07/29/19 110/70  11/26/18 (!) 105/53  11/10/18 128/60   Guideline developer:  UpToDate (see UpToDate for funding source) Date Released: June 2014  Health Maintenance Due  Topic Date Due  . COLONOSCOPY  11/12/2016    There are no preventive care reminders to display  for this patient.  Lab Results  Component Value Date   TSH 2.340 07/28/2017   Lab Results  Component Value Date   WBC 6.9 07/29/2019   HGB 14.2 07/29/2019   HCT 42.7 07/29/2019   MCV 88.9 07/29/2019   PLT 269.0 07/29/2019   Lab Results  Component Value Date   NA 144 07/29/2019   K 4.8 07/29/2019   CO2 30 07/29/2019   GLUCOSE 89 07/29/2019   BUN 16 07/29/2019   CREATININE 0.81 07/29/2019   BILITOT 0.7 07/29/2019   ALKPHOS 81 07/29/2019   AST 18 07/29/2019   ALT 16 07/29/2019   PROT 6.7 07/29/2019   ALBUMIN 4.4 07/29/2019   CALCIUM 10.5 07/29/2019   GFR 91.20 07/29/2019   Lab Results  Component Value Date   CHOL 174 07/29/2019   Lab Results  Component Value Date   HDL 48.10 07/29/2019   Lab Results  Component Value Date   LDLCALC 111 (H) 07/29/2019   Lab Results  Component Value Date   TRIG 77.0 07/29/2019   Lab Results  Component Value Date   CHOLHDL 4 07/29/2019   No results found for: HGBA1C    Assessment & Plan:   Problem List Items Addressed This Visit      Other   Healthcare maintenance   Gout - Primary   Relevant Orders   Comprehensive metabolic panel (Completed)   Urinalysis, Routine w reflex microscopic (Completed)   CBC (Completed)   Uric acid (Completed)   Elevated LDL cholesterol level   Relevant Orders   Comprehensive metabolic panel (Completed)   LDL cholesterol, direct (Completed)   Lipid panel (Completed)   CBC (Completed)  Need for influenza vaccination   Relevant Orders   Flu Vaccine QUAD High Dose(Fluad) (Completed)      No orders of the defined types were placed in this encounter.   Follow-up: Return in about 6 months (around 01/26/2020).   I told him that it was important to take allopurinol to prevent gouty attacks and that it was not used to treat gouty attacks.  He said that he understood this.  We will remeasure his uric acid level today and see if it is increased over last visit.  Suggested that he could  exercise [place] [facility].  Recommended that he be sure to see the ophthalmologist this year. Patient was given information on health maintenance and disease prevention.

## 2019-08-19 DIAGNOSIS — G4733 Obstructive sleep apnea (adult) (pediatric): Secondary | ICD-10-CM | POA: Diagnosis not present

## 2019-08-25 DIAGNOSIS — Z23 Encounter for immunization: Secondary | ICD-10-CM | POA: Insufficient documentation

## 2019-08-25 NOTE — Patient Instructions (Signed)
Health Maintenance After Age 82 After age 39, you are at a higher risk for certain long-term diseases and infections as well as injuries from falls. Falls are a major cause of broken bones and head injuries in people who are older than age 66. Getting regular preventive care can help to keep you healthy and well. Preventive care includes getting regular testing and making lifestyle changes as recommended by your health care provider. Talk with your health care provider about:  Which screenings and tests you should have. A screening is a test that checks for a disease when you have no symptoms.  A diet and exercise plan that is right for you. What should I know about screenings and tests to prevent falls? Screening and testing are the best ways to find a health problem early. Early diagnosis and treatment give you the best chance of managing medical conditions that are common after age 8. Certain conditions and lifestyle choices may make you more likely to have a fall. Your health care provider may recommend:  Regular vision checks. Poor vision and conditions such as cataracts can make you more likely to have a fall. If you wear glasses, make sure to get your prescription updated if your vision changes.  Medicine review. Work with your health care provider to regularly review all of the medicines you are taking, including over-the-counter medicines. Ask your health care provider about any side effects that may make you more likely to have a fall. Tell your health care provider if any medicines that you take make you feel dizzy or sleepy.  Osteoporosis screening. Osteoporosis is a condition that causes the bones to get weaker. This can make the bones weak and cause them to break more easily.  Blood pressure screening. Blood pressure changes and medicines to control blood pressure can make you feel dizzy.  Strength and balance checks. Your health care provider may recommend certain tests to check your  strength and balance while standing, walking, or changing positions.  Foot health exam. Foot pain and numbness, as well as not wearing proper footwear, can make you more likely to have a fall.  Depression screening. You may be more likely to have a fall if you have a fear of falling, feel emotionally low, or feel unable to do activities that you used to do.  Alcohol use screening. Using too much alcohol can affect your balance and may make you more likely to have a fall. What actions can I take to lower my risk of falls? General instructions  Talk with your health care provider about your risks for falling. Tell your health care provider if: ? You fall. Be sure to tell your health care provider about all falls, even ones that seem minor. ? You feel dizzy, sleepy, or off-balance.  Take over-the-counter and prescription medicines only as told by your health care provider. These include any supplements.  Eat a healthy diet and maintain a healthy weight. A healthy diet includes low-fat dairy products, low-fat (lean) meats, and fiber from whole grains, beans, and lots of fruits and vegetables. Home safety  Remove any tripping hazards, such as rugs, cords, and clutter.  Install safety equipment such as grab bars in bathrooms and safety rails on stairs.  Keep rooms and walkways well-lit. Activity   Follow a regular exercise program to stay fit. This will help you maintain your balance. Ask your health care provider what types of exercise are appropriate for you.  If you need a cane or  walker, use it as recommended by your health care provider.  Wear supportive shoes that have nonskid soles. Lifestyle  Do not drink alcohol if your health care provider tells you not to drink.  If you drink alcohol, limit how much you have: ? 0-1 drink a day for women. ? 0-2 drinks a day for men.  Be aware of how much alcohol is in your drink. In the [place] one drink equals one typical bottle of beer (12  oz), one-half glass of wine (5 oz), or one shot of hard liquor (1 oz).  Do not use any products that contain nicotine or tobacco, such as cigarettes and e-cigarettes. If you need help quitting, ask your health care provider. Summary  Having a healthy lifestyle and getting preventive care can help to protect your health and wellness after age 65.  Screening and testing are the best way to find a health problem early and help you avoid having a fall. Early diagnosis and treatment give you the best chance for managing medical conditions that are more common for people who are older than age 65.  Falls are a major cause of broken bones and head injuries in people who are older than age 65. Take precautions to prevent a fall at home.  Work with your health care provider to learn what changes you can make to improve your health and wellness and to prevent falls. This information is not intended to replace advice given to you by your health care provider. Make sure you discuss any questions you have with your health care provider. Document Released: 09/03/2017 Document Revised: 02/11/2019 Document Reviewed: 09/03/2017 Elsevier Patient Education  2020 [employer]  Preventive Care 65 Years and Older, Male Preventive care refers to lifestyle choices and visits with your health care provider that can promote health and wellness. This includes:  A yearly physical exam. This is also called an annual well check.  Regular dental and eye exams.  Immunizations.  Screening for certain conditions.  Healthy lifestyle choices, such as diet and exercise. What can I expect for my preventive care visit? Physical exam Your health care provider will check:  Height and weight. These may be used to calculate body mass index (BMI), which is a measurement that tells if you are at a healthy weight.  Heart rate and blood pressure.  Your skin for abnormal spots. Counseling Your health care provider may ask  you questions about:  Alcohol, tobacco, and drug use.  Emotional well-being.  Home and relationship well-being.  Sexual activity.  Eating habits.  History of falls.  Memory and ability to understand (cognition).  Work and work environment. What immunizations do I need?  Influenza (flu) vaccine  This is recommended every year. Tetanus, diphtheria, and pertussis (Tdap) vaccine  You may need a Td booster every 10 years. Varicella (chickenpox) vaccine  You may need this vaccine if you have not already been vaccinated. Zoster (shingles) vaccine  You may need this after age 60. Pneumococcal conjugate (PCV13) vaccine  One dose is recommended after age 65. Pneumococcal polysaccharide (PPSV23) vaccine  One dose is recommended after age 65. Measles, mumps, and rubella (MMR) vaccine  You may need at least one dose of MMR if you were born in 1957 or later. You may also need a second dose. Meningococcal conjugate (MenACWY) vaccine  You may need this if you have certain conditions. Hepatitis A vaccine  You may need this if you have certain conditions or if you travel or work   in places where you may be exposed to hepatitis A. Hepatitis B vaccine  You may need this if you have certain conditions or if you travel or work in places where you may be exposed to hepatitis B. Haemophilus influenzae type b (Hib) vaccine  You may need this if you have certain conditions. You may receive vaccines as individual doses or as more than one vaccine together in one shot (combination vaccines). Talk with your health care provider about the risks and benefits of combination vaccines. What tests do I need? Blood tests  Lipid and cholesterol levels. These may be checked every 5 years, or more frequently depending on your overall health.  Hepatitis C test.  Hepatitis B test. Screening  Lung cancer screening. You may have this screening every year starting at age 33 if you have a  30-pack-year history of smoking and currently smoke or have quit within the past 15 years.  Colorectal cancer screening. All adults should have this screening starting at age 23 and continuing until age 65. Your health care provider may recommend screening at age 85 if you are at increased risk. You will have tests every 1-10 years, depending on your results and the type of screening test.  Prostate cancer screening. Recommendations will vary depending on your family history and other risks.  Diabetes screening. This is done by checking your blood sugar (glucose) after you have not eaten for a while (fasting). You may have this done every 1-3 years.  Abdominal aortic aneurysm (AAA) screening. You may need this if you are a current or former smoker.  Sexually transmitted disease (STD) testing. Follow these instructions at home: Eating and drinking  Eat a diet that includes fresh fruits and vegetables, whole grains, lean protein, and low-fat dairy products. Limit your intake of foods with high amounts of sugar, saturated fats, and salt.  Take vitamin and mineral supplements as recommended by your health care provider.  Do not drink alcohol if your health care provider tells you not to drink.  If you drink alcohol: ? Limit how much you have to 0-2 drinks a day. ? Be aware of how much alcohol is in your drink. In the [place] one drink equals one 12 oz bottle of beer (355 mL), one 5 oz glass of wine (148 mL), or one 1 oz glass of hard liquor (44 mL). Lifestyle  Take daily care of your teeth and gums.  Stay active. Exercise for at least 30 minutes on 5 or more days each week.  Do not use any products that contain nicotine or tobacco, such as cigarettes, e-cigarettes, and chewing tobacco. If you need help quitting, ask your health care provider.  If you are sexually active, practice safe sex. Use a condom or other form of protection to prevent STIs (sexually transmitted infections).  Talk  with your health care provider about taking a low-dose aspirin or statin. What's next?  Visit your health care provider once a year for a well check visit.  Ask your health care provider how often you should have your eyes and teeth checked.  Stay up to date on all vaccines. This information is not intended to replace advice given to you by your health care provider. Make sure you discuss any questions you have with your health care provider. Document Released: 11/17/2015 Document Revised: 10/15/2018 Document Reviewed: 10/15/2018 Elsevier Patient Education  2020 [employer].

## 2019-08-30 ENCOUNTER — Other Ambulatory Visit: Payer: Self-pay | Admitting: Neurology

## 2019-09-28 ENCOUNTER — Other Ambulatory Visit: Payer: Self-pay | Admitting: Neurology

## 2019-09-29 ENCOUNTER — Other Ambulatory Visit: Payer: Self-pay

## 2019-10-13 ENCOUNTER — Other Ambulatory Visit: Payer: Self-pay | Admitting: Neurology

## 2019-11-27 ENCOUNTER — Ambulatory Visit: Payer: PPO

## 2019-11-29 ENCOUNTER — Ambulatory Visit: Payer: PPO | Attending: Internal Medicine

## 2019-11-29 DIAGNOSIS — Z23 Encounter for immunization: Secondary | ICD-10-CM | POA: Insufficient documentation

## 2019-11-29 NOTE — Progress Notes (Signed)
   Covid-[facility] Vaccination [facility]  Name:  [name]    MRN: [id number] DOB: Aug 07, 1935  11/29/2019  [name] was observed post Covid-19 immunization for 15 minutes without incidence. He was provided with Vaccine Information Sheet and instruction to access the V-Safe system.   [name] was instructed to call 911 with any severe reactions post vaccine: Marland Kitchen Difficulty breathing  . Swelling of your face and throat  . A fast heartbeat  . A bad rash all over your body  . Dizziness and weakness    Immunizations Administered    Name Date Dose VIS Date Route   Pfizer COVID-19 Vaccine 11/29/2019  1:10 PM 0.3 mL 10/15/2019 Intramuscular   Manufacturer: [place]   Lot: [id number]   [place]: [id number]

## 2019-12-14 ENCOUNTER — Encounter: Payer: Self-pay | Admitting: Neurology

## 2019-12-20 ENCOUNTER — Ambulatory Visit: Payer: PPO | Attending: Internal Medicine

## 2019-12-20 DIAGNOSIS — Z23 Encounter for immunization: Secondary | ICD-10-CM

## 2019-12-20 NOTE — Progress Notes (Signed)
   Covid-19 Vaccination Clinic  Name:  [name]    MRN: [id number] DOB: [birth year]  12/20/2019  [name] was observed post Covid-19 immunization for 15 minutes without incidence. He was provided with Vaccine Information Sheet and instruction to access the V-Safe system.   [name] was instructed to call 911 with any severe reactions post vaccine: Marland Kitchen Difficulty breathing  . Swelling of your face and throat  . A fast heartbeat  . A bad rash all over your body  . Dizziness and weakness    Immunizations Administered    Name Date Dose VIS Date Route   Pfizer COVID-19 Vaccine 12/20/2019 12:14 PM 0.3 mL 10/15/2019 Intramuscular   Manufacturer: [place]   Lot: [id number]   [place]: [id number]

## 2019-12-27 ENCOUNTER — Other Ambulatory Visit: Payer: Self-pay | Admitting: Neurology

## 2019-12-27 MED ORDER — MIRTAZAPINE 45 MG PO TABS: 45.0000 mg | ORAL_TABLET | Freq: Every day | ORAL | 0 refills | Status: DC

## 2019-12-27 MED ORDER — DONEPEZIL HCL 10 MG PO TABS: 10.0000 mg | ORAL_TABLET | Freq: Every day | ORAL | 0 refills | Status: DC

## 2019-12-28 DIAGNOSIS — Z961 Presence of intraocular lens: Secondary | ICD-10-CM | POA: Diagnosis not present

## 2019-12-28 DIAGNOSIS — H40053 Ocular hypertension, bilateral: Secondary | ICD-10-CM | POA: Diagnosis not present

## 2019-12-28 DIAGNOSIS — H16223 Keratoconjunctivitis sicca, not specified as Sjogren's, bilateral: Secondary | ICD-10-CM | POA: Diagnosis not present

## 2020-01-17 ENCOUNTER — Encounter: Payer: Self-pay | Admitting: Neurology

## 2020-01-19 ENCOUNTER — Other Ambulatory Visit: Payer: Self-pay

## 2020-01-19 ENCOUNTER — Encounter: Payer: Self-pay | Admitting: Neurology

## 2020-01-19 ENCOUNTER — Ambulatory Visit (INDEPENDENT_AMBULATORY_CARE_PROVIDER_SITE_OTHER): Payer: PPO | Admitting: Neurology

## 2020-01-19 VITALS — BP 143/73 | HR 77 | Temp 97.5°F | Ht 66.0 in | Wt 150.0 lb

## 2020-01-19 DIAGNOSIS — G4733 Obstructive sleep apnea (adult) (pediatric): Secondary | ICD-10-CM | POA: Diagnosis not present

## 2020-01-19 DIAGNOSIS — G3184 Mild cognitive impairment, so stated: Secondary | ICD-10-CM

## 2020-01-19 NOTE — Progress Notes (Signed)
[facility]  PATIENT: [name] DOB: [birth year]   REASON FOR VISIT: Follow-up obstructive sleep apnea on BiPAP HISTORY FROM: Patient alone.     HISTORY OF PRESENT ILLNESS:  I have the pleasure of seeing [name] today again he is an established BiPAP user and in his last visit was me we were concerned about her slowly rising apnea hypopnea index.  I adjusted the settings to currently 17/13 centimeters water pressure and his AHI is now 4.9 which is a satisfying result.  There are no central apneas emerging anymore.  He is highly compliant and on average he uses the machine for 7 hours and 30 minutes at night, he is 87% compliant with his days he had a.  Of 3 days where he could not use the machine.  Overall previous months were at 100% compliance.  He has no major air leakage his fatigue score was endorsed at 10 out of 63 points his sleepiness score at 4 out of 24 points.  There is 0 there is 1 point on his depression score out of 15 possible points. He is living alone, has two adult children, a daughter and a son. He is sleeping less long , wakes up after 3-4 hours and would like an increase in meds. He has invested in Scientist, research (life sciences) estate and is not financially strained.  BP is elevated today. Aricept has been taken for 5 years upon recommendation of a pharmacist friend.     I have the pleasure of meeting today with Mr.David "Barnabas Lister" Ionescu Massey, who has followed as here for BiPAP compliance for long time.  The patient presents again with excellent compliance of 97%, 29 out of 30 days with an average user time of 6 hours 35 minutes, inspiratory pressure is set to 13 expiratory pressure to 10 cmH2O with a residual AHI of 19.8 obstructive apneas.  There were no air leaks and I can really not explained this  high apnea number.  He reports that it is not troublesome for him to breathe, he is compliant because he likes to use his BiPAP, and I wonder if we have to increase his  pressure by about 2 cmH2O.  Last AHI was 14/h.  He reports having GERD and related apneas are a possibility, he had esophageal stretching procedures with GI. He remains hoarse.  I like for him to have a new BiPAP titration.     UPDATE 05/28/2018 CM [name], 85 year old male returns for follow-up for BiPAP compliance.  He continues to have compliance greater than 4 hours at 83%.  Average usage 6 hours 5 minutes.  Set pressure 9 to 13 cm.  Leak 95th percentile 4.1 AHI 14.4/h  which is much improved from last visit.  No central apneas, his  ESS 4/ 24 .  He still says he is not sleeping enough that he did get a wedge and that has helped with his GERD.   He returns for reevaluation  Interval history from 24 September 2017,CD I have the pleasure of meeting today with [name] after a one-year hiatus.  The patient has been followed for CPAP compliance in our office for several years now.  He has a high compliance again 87% of the time, average use of time is 5 hours and 31 minutes, he is using a VPAP auto machine inspiratory pressure is set at 19 cm water expiratory pressure at 15, he does not have detectable air leaks but still high a number of  obstructive apneas which has bothered me for years.  He has not noticed a difference between BiPAP and CPAP.  There has been a trend over the last 30 days to higher AHI is from about 10 on average each night to almost 40, and this is not corresponding to air leaks, usage time there has been no adjustment in pressure. The BiPAP's consistently good airflow pattern and data for respiratory rate contradict the measured residual AHI of over 30. He has severe GERD- could that be the  Artefact ? - yes, oesophageal pressure will induce a reading of OSA, he is hoarse , too.  He should change to an elevated pillow, wedge .      UPDATE 9/28 /2018CM Mr.[name], 85 year old male returns for follow-up with history of obstructive sleep apnea on BiPAP. He feels he is doing well  however compliance data actually worse than 3 months ago. Data dated 07/02/2017 through 9/27/ 2018 Shows 90% compliance greater than 4 hours. Average usage 6 hours 18 minutes. Pressure set 13-17 cm. AHI 32.7 central apneas 0.3. Obstructive apneas 29.2. Patient also feels he has a Leak however this is not evident on the report.Marland Kitchen He is wondering if his Remeron can be increased. He returns for reevaluation    UPDATE 6/28/ 2018CM Mr. Frein, 85 year old male returns for follow-up with history of obstructive sleep apnea. He was on CPAP when last seen he was switched to BiPAP due to high residual apnea index. He feels that he is doing well on BiPAP Compliance data reviewed today for 30 days from 03/31/2017 2 04/29/2017 100% compliance for 30 days greater than 4 hours. Average usage 7 hours 14 minutes. Pressure set 16 cm/12 cm of pressure. AHI 21.3. Central apneas 0.3. Obstructive apneas 17.2. He returns to discuss this download.  01/30/17 MMMr. [name] is a 85 year old male with a history of memory disturbance and obstructive sleep apnea on CPAP. He returns today for a compliance download. At the last visit his pressure was increased. His download indicates that he uses his machine 60/60 days for compliance of 100%. He uses machine greater than 4 hours 32/60 days for compliance of 53%. On average he uses his machine 4 hours and 7 minutes. He is on auto set with a minimum pressure of 6 cm water and maximum pressure of 20 cm of water with EPR 2. His average pressure is 18.7 cm/m. His residual AHI is 25.1. He returns today to discuss his download.   11/25/2016: [name] is a 85 year old male with a history of memory disturbance and obstructive sleep apnea on CPAP. He returns today for follow-up. The patient's CPAP download indicates that he uses machine 28 out of 30 days for compliance of 93%. He only uses machine greater than 4 hour 12 out of 30 days for compliance of 40%. His residual AHI is 24.8 on a minimum  pressure of 6cm of water with a maximum pressure 15 cmof water. His leak in the 95th percentile is 24.9 L/m. The patient's last download was approximately 2 years ago. At that time the download showed excellent treatment of his apnea. The patient feels that his memory has remained stable. He livesat home alone. He is able to complete all ADLs independently. He operates a Teacher, music without difficulty. He is able to manage his finances and prepares all meals without difficulty. He returns today for an evaluation.   REVIEW OF SYSTEMS: Full 14 system review of systems performed and notable only for those listed, all others are  neg:   Obstructive sleep apnea, treated  with BiPAP. On Aricept for subjective memory problems.     ALLERGIES: Allergies  Allergen Reactions  . Sulfamethoxazole Other (See Comments)    Constipation  . Sulfamethoxazole-Trimethoprim     constipation    HOME MEDICATIONS: Outpatient Medications Prior to Visit  Medication Sig Dispense Refill  . aspirin 81 MG tablet Take 81 mg by mouth daily.      . cycloSPORINE (RESTASIS) 0.05 % ophthalmic emulsion 1 drop 2 (two) times daily. As needed.     . donepezil (ARICEPT) 10 MG tablet Take 1 tablet (10 mg total) by mouth at bedtime. 90 tablet 0  . finasteride (PROSCAR) 5 MG tablet Take 5 mg by mouth daily.    . mirtazapine (REMERON) 45 MG tablet Take 1 tablet (45 mg total) by mouth at bedtime. 30 tablet 0  . Multiple Vitamin (MULTIVITAMIN) capsule Take 1 capsule by mouth daily.    Marland Kitchen NEXIUM 40 MG capsule TAKE 1 CAPSULE BY MOUTH DAILY BEFORE BREAKFAST. 90 capsule 6  . pantoprazole (PROTONIX) 40 MG tablet Take 1 tablet (40 mg total) by mouth daily. 30 tablet 11  . Probiotic Product (ALIGN PO) Take 1 capsule by mouth daily.    . valACYclovir (VALTREX) 1000 MG tablet TAKE 1/2 TABLET BY MOUTH ONCE DAILY 45 tablet 3   No facility-administered medications prior to visit.    PAST MEDICAL HISTORY: Past Medical History:  Diagnosis  Date  . Barrett's esophagus   . Cataract   . Diverticulosis of colon (without mention of hemorrhage)   . Elevated PSA   . Esophageal stricture   . GERD (gastroesophageal reflux disease)   . Glaucoma   . Gout   . Herpes   . Hiatal hernia   . History of colon cancer   . HOH (hard of hearing)   . Hyperlipidemia   . Hypersomnia, persistent 11/24/2013   Reports Epworth of 10 and higher on CPAP with  good compliance.   . Hypertrophy of prostate without urinary obstruction and other lower urinary tract symptoms (LUTS)   . Impotence of organic origin   . Insomnia   . Memory change 04/19/2013  . OSA on CPAP   . Prostate cancer ([place])   . Prostatitis   . Stroke ([place])    tia in  2010  . TIA (transient ischemic attack)   . Vertigo     PAST SURGICAL HISTORY: Past Surgical History:  Procedure Laterality Date  . EYE SURGERY     both cataracts  . INTRACAPSULAR CATARACT EXTRACTION     Lt eye  . MASS EXCISION N/A 07/29/2013   Procedure: MINOR EXCISION OF CHEST KELOID CONTRACTURE;  Surgeon: Theodoro Kos, DO;  Location: [facility];  Service: Plastics;  Laterality: N/A;  ACELL placement  . MINOR GRAFT APPLICATION N/A [id number]   Procedure: MINOR GRAFT APPLICATION A CELL;  Surgeon: Theodoro Kos, DO;  Location: [facility];  Service: Plastics;  Laterality: N/A;  . REFRACTIVE SURGERY      FAMILY HISTORY: Family History  Problem Relation Age of Onset  . Cirrhosis Mother   . Jaundice Mother   . Hyperlipidemia Father   . Heart disease Father   . Colon cancer Neg Hx   . Esophageal cancer Neg Hx   . Rectal cancer Neg Hx   . Stomach cancer Neg Hx     SOCIAL HISTORY: Social History   Socioeconomic History  . Marital status: Single    Spouse  name: Not on file  . Number of children: 2  . Years of education: 61  . Highest education level: Not on file  Occupational History  . Occupation: Retired  Tobacco Use  . Smoking status: Former Smoker    Quit date:  07/23/1962    Years since quitting: 57.5  . Smokeless tobacco: Never Used  . Tobacco comment: Stopped in 1963  Substance and Sexual Activity  . Alcohol use: Yes    Alcohol/week: 14.0 standard drinks    Types: 14 Standard drinks or equivalent per week    Comment: 2-3 drinks nightly  . Drug use: No  . Sexual activity: Yes    Comment: 5 partners  Other Topics Concern  . Not on file  Social History Narrative   Patient is single.   Patient has two children.   Patient is retired.   Patient has a college education.   Patient is right-handed.   Patient drinks about 3 cups of coffee daily.   Patient gets regular exercise.   Social Determinants of Health   Financial Resource Strain:   . Difficulty of Paying Living Expenses:   Food Insecurity:   . Worried About Charity fundraiser in the Last Year:   . Arboriculturist in the Last Year:   Transportation Needs:   . Film/video editor (Medical):   Marland Kitchen Lack of Transportation (Non-Medical):   Physical Activity:   . Days of Exercise per Week:   . Minutes of Exercise per Session:   Stress:   . Feeling of Stress :   Social Connections:   . Frequency of Communication with Friends and Family:   . Frequency of Social Gatherings with Friends and Family:   . Attends Religious Services:   . Active Member of Clubs or Organizations:   . Attends Archivist Meetings:   Marland Kitchen Marital Status:   Intimate Partner Violence:   . Fear of Current or Ex-Partner:   . Emotionally Abused:   Marland Kitchen Physically Abused:   . Sexually Abused:      PHYSICAL EXAM  Vitals:   01/19/20 1018  BP: (!) 143/73  Pulse: 77  Temp: (!) 97.5 F (36.4 C)  Weight: 150 lb (68 kg)  Height: 5\' 6"  (1.676 m)   Body mass index is 24.21 kg/m.  Generalized: Well developed, in no acute distress  Head: normocephalic and atraumatic,. Oropharynx benign mallopatti 2-3 Neck: Supple, circumference 14 inches Musculoskeletal: No deformity  Skin no edema Neurological  examination   Mentation: Alert oriented to time, place, history taking. Attention span and concentration appropriate. Recent and remote memory intact.- he is on aricept (!)   Follows all commands speech and language fluent.   Cranial nerve : .no loss of  Taste, but slowly declining smell . Pupils were equal round reactive to light, with normal  extraocular movements were full, visual field were full on confrontational test.  Facial sensation and strength were normal.  Hearing was impaired bilaterally. Uvula tongue midline. head turning and shoulder shrug were normal and symmetric.Tongue protrusion into cheek strength was normal. Motor: normal bulk and tone, full strength in the BUE, BLE,  Sensory: normal and symmetric to light touch,  Coordination: finger-nose-without dysmetria and without tremor.no  dysmetria Gait and Station: Rising up from seated position without assistance, normal stance,  moderate stride, good arm swing,  smooth turning with 3 steps. ,he is  able to perform tiptoe, and heel walking without difficulty.  Tandem gait is steady, no  assistive device  DIAGNOSTIC DATA (LABS, IMAGING, TESTING) - I reviewed patient records, labs, notes, testing and imaging myself where available.  Lab Results  Component Value Date   WBC 6.9 07/29/2019   HGB 14.2 07/29/2019   HCT 42.7 07/29/2019   MCV 88.9 07/29/2019   PLT 269.0 07/29/2019      Component Value Date/Time   NA 144 07/29/2019 1004   NA 142 07/28/2017 1504   K 4.8 07/29/2019 1004   CL 105 07/29/2019 1004   CO2 30 07/29/2019 1004   GLUCOSE 89 07/29/2019 1004   BUN 16 07/29/2019 1004   BUN 14 07/28/2017 1504   CREATININE 0.81 07/29/2019 1004   CREATININE 0.78 07/24/2016 1432   CALCIUM 10.5 07/29/2019 1004   PROT 6.7 07/29/2019 1004   PROT 7.0 07/28/2017 1504   ALBUMIN 4.4 07/29/2019 1004   ALBUMIN 4.7 07/28/2017 1504   AST 18 07/29/2019 1004   ALT 16 07/29/2019 1004   ALKPHOS 81 07/29/2019 1004   BILITOT 0.7  07/29/2019 1004   BILITOT 0.5 07/28/2017 1504   GFRNONAA 79 07/28/2017 1504   GFRNONAA 87 04/18/2015 0826   GFRAA 92 07/28/2017 1504   GFRAA >89 04/18/2015 0826   Lab Results  Component Value Date   CHOL 174 07/29/2019   HDL 48.10 07/29/2019   LDLCALC 111 (H) 07/29/2019   LDLDIRECT 112.0 07/29/2019   TRIG 77.0 07/29/2019   CHOLHDL 4 07/29/2019    Lab Results  Component Value Date   VITAMINB12 656 07/24/2016   Lab Results  Component Value Date   TSH 2.340 07/28/2017      ASSESSMENT AND PLAN:  Mr.Shakeel Joss Massey is an  85 y.o. year-old male has a past medical history Obstructive sleep apnea on BiPAP, who reported subjective memory problems, and his pharmacist and friend recommended aricept and he has taken it for years. Newly reported is loss of smell. He didn't get COVID and is vaccinated now.   BiPAP data reviewed 04/27/2018 -05/26/2018 shows compliance greater than 4 hours at 87%.   Average user time is 7 h 30 min.   PLAN:  Keep pressure to 13/10 cm   Re- order supplies F/U in 12 months, already has appt.   [facility] [address] [place], [place] [address] [phone]

## 2020-02-01 NOTE — Progress Notes (Signed)
Nurse connected with patient 02/02/20 at 11:45 AM EDT by a telephone enabled telemedicine application and verified that I am speaking with the correct person using two identifiers. Patient stated full name and DOB. Patient gave permission to continue with virtual visit. Patient's location was at home and Nurse's location was at [place] office.   Subjective:   [name] is a 85 y.o. male who presents for Medicare Annual/Subsequent preventive examination.  Review of Systems:   Home Safety/Smoke Alarms: Feels safe in home. Smoke alarms in place.  Lives alone in 1 story home. Dtr lives next door.   Male:      PSA-  Lab Results  Component Value Date   PSA 9.13 (H) 04/14/2014       Objective:    Vitals: Unable to assess. This visit is enabled though telemedicine due to Covid 19.   Advanced Directives 02/02/2020 07/28/2017 11/20/2015 05/16/2015 07/23/2013  Does Patient Have a Medical Advance Directive? No No No No Patient does not have advance directive  Would patient like information on creating a medical advance directive? No - Patient declined Yes (ED - Information included in AVS) - - -    Tobacco Social History   Tobacco Use  Smoking Status Former Smoker  . Quit date: 07/23/1962  . Years since quitting: 57.5  Smokeless Tobacco Never Used  Tobacco Comment   Stopped in 1963     Counseling given: Not Answered Comment: Stopped in 1963   Clinical Intake:     Pain : No/denies pain                 Past Medical History:  Diagnosis Date  . Barrett's esophagus   . Cataract   . Diverticulosis of colon (without mention of hemorrhage)   . Elevated PSA   . Esophageal stricture   . GERD (gastroesophageal reflux disease)   . Glaucoma   . Gout   . Herpes   . Hiatal hernia   . History of colon cancer   . HOH (hard of hearing)   . Hyperlipidemia   . Hypersomnia, persistent 11/24/2013   Reports Epworth of 10 and higher on CPAP with  good compliance.   .  Hypertrophy of prostate without urinary obstruction and other lower urinary tract symptoms (LUTS)   . Impotence of organic origin   . Insomnia   . Memory change 04/19/2013  . OSA on CPAP   . Prostate cancer ([place])   . Prostatitis   . Stroke ([place])    tia in  2010  . TIA (transient ischemic attack)   . Vertigo    Past Surgical History:  Procedure Laterality Date  . EYE SURGERY     both cataracts  . INTRACAPSULAR CATARACT EXTRACTION     Lt eye  . MASS EXCISION N/A 07/29/2013   Procedure: MINOR EXCISION OF CHEST KELOID CONTRACTURE;  Surgeon: Theodoro Kos, DO;  Location: [facility];  Service: Plastics;  Laterality: N/A;  ACELL placement  . MINOR GRAFT APPLICATION N/A [id number]   Procedure: MINOR GRAFT APPLICATION A CELL;  Surgeon: Theodoro Kos, DO;  Location: [facility];  Service: Plastics;  Laterality: N/A;  . REFRACTIVE SURGERY     Family History  Problem Relation Age of Onset  . Cirrhosis Mother   . Jaundice Mother   . Hyperlipidemia Father   . Heart disease Father   . Colon cancer Neg Hx   . Esophageal cancer Neg Hx   . Rectal cancer  Neg Hx   . Stomach cancer Neg Hx    Social History   Socioeconomic History  . Marital status: Single    Spouse name: Not on file  . Number of children: 2  . Years of education: 27  . Highest education level: Not on file  Occupational History  . Occupation: Retired  Tobacco Use  . Smoking status: Former Smoker    Quit date: 07/23/1962    Years since quitting: 57.5  . Smokeless tobacco: Never Used  . Tobacco comment: Stopped in 1963  Substance and Sexual Activity  . Alcohol use: Yes    Alcohol/week: 14.0 standard drinks    Types: 14 Standard drinks or equivalent per week    Comment: 2-3 drinks nightly  . Drug use: No  . Sexual activity: Yes    Comment: 5 partners  Other Topics Concern  . Not on file  Social History Narrative   Patient is single.   Patient has two children.   Patient is retired.    Patient has a college education.   Patient is right-handed.   Patient drinks about 3 cups of coffee daily.   Patient gets regular exercise.   Social Determinants of Health   Financial Resource Strain: Low Risk   . Difficulty of Paying Living Expenses: Not hard at all  Food Insecurity: No Food Insecurity  . Worried About Charity fundraiser in the Last Year: Never true  . Ran Out of Food in the Last Year: Never true  Transportation Needs: No Transportation Needs  . Lack of Transportation (Medical): No  . Lack of Transportation (Non-Medical): No  Physical Activity:   . Days of Exercise per Week:   . Minutes of Exercise per Session:   Stress:   . Feeling of Stress :   Social Connections:   . Frequency of Communication with Friends and Family:   . Frequency of Social Gatherings with Friends and Family:   . Attends Religious Services:   . Active Member of Clubs or Organizations:   . Attends Archivist Meetings:   Marland Kitchen Marital Status:     Outpatient Encounter Medications as of 02/02/2020  Medication Sig  . aspirin 81 MG tablet Take 81 mg by mouth daily.    . cycloSPORINE (RESTASIS) 0.05 % ophthalmic emulsion 1 drop 2 (two) times daily. As needed.   . donepezil (ARICEPT) 10 MG tablet Take 1 tablet (10 mg total) by mouth at bedtime.  . finasteride (PROSCAR) 5 MG tablet Take 5 mg by mouth daily.  . mirtazapine (REMERON) 45 MG tablet Take 1 tablet (45 mg total) by mouth at bedtime.  . Multiple Vitamin (MULTIVITAMIN) capsule Take 1 capsule by mouth daily.  Marland Kitchen NEXIUM 40 MG capsule TAKE 1 CAPSULE BY MOUTH DAILY BEFORE BREAKFAST.  . pantoprazole (PROTONIX) 40 MG tablet Take 1 tablet (40 mg total) by mouth daily.  . valACYclovir (VALTREX) 1000 MG tablet TAKE 1/2 TABLET BY MOUTH ONCE DAILY  . Probiotic Product (ALIGN PO) Take 1 capsule by mouth daily.   No facility-administered encounter medications on file as of 02/02/2020.    Activities of Daily Living In your present state of  health, do you have any difficulty performing the following activities: 02/02/2020  Hearing? N  Vision? N  Difficulty concentrating or making decisions? N  Walking or climbing stairs? N  Dressing or bathing? N  Doing errands, shopping? N  Preparing Food and eating ? N  Using the Toilet? N  In the past  six months, have you accidently leaked urine? N  Do you have problems with loss of bowel control? N  Managing your Medications? N  Managing your Finances? N  Housekeeping or managing your Housekeeping? N  Some recent data might be hidden    Patient Care Team: Libby Maw, MD as PCP - General (Family Medicine) Melissa Montane, MD as Consulting Physician (Otolaryngology) Dohmeier, Asencion Partridge, MD as Consulting Physician (Neurology) Dillingham, Loel Lofty, DO as Attending Physician (Plastic Surgery) Warden Fillers, MD as Consulting Physician (Ophthalmology) Franchot Gallo, MD as Consulting Physician (Urology) Kathrynn Ducking, MD as Consulting Physician (Neurology)   Assessment:   This is a routine wellness examination for Imraan. Physical assessment deferred to PCP.  Exercise Activities and Dietary recommendations Current Exercise Habits: Home exercise routine, Type of exercise: treadmill;walking;strength training/weights, Time (Minutes): 30, Frequency (Times/Week): 3, Weekly Exercise (Minutes/Week): 90, Intensity: Mild, Exercise limited by: None identified Diet (meal preparation, eat out, water intake, caffeinated beverages, dairy products, fruits and vegetables): in general, a "healthy" diet  , well balanced  Goals    . Maintain healthy life and independence       Fall Risk Fall Risk  02/02/2020 09/29/2019 09/24/2018 09/24/2017 08/01/2017  Falls in the past year? 0 0 0 No No  Comment - Emmi Telephone Survey: data to providers prior to load - - -  Number falls in past yr: 0 - - - -  Injury with Fall? 0 - - - -  Follow up Education provided;Falls prevention discussed - - -  -     Depression Screen PHQ 2/9 Scores 02/02/2020 07/28/2017 07/24/2016 12/10/2015  PHQ - 2 Score 0 0 0 0    Cognitive Function Ad8 score reviewed for issues:  Issues making decisions:no  Less interest in hobbies / activities:no  Repeats questions, stories (family complaining):no  Trouble using ordinary gadgets (microwave, computer, phone):no  Forgets the month or year: no  Mismanaging finances: no  Remembering appts:no  Daily problems with thinking and/or memory:no Ad8 score is=0     MMSE - Mini Mental State Exam 11/25/2016 05/21/2016 11/20/2015 05/16/2015  Orientation to time 5 5 5 5   Orientation to Place 5 5 5 5   Registration 3 3 3 3   Attention/ Calculation 3 4 5 5   Recall 2 3 3 3   Language- name 2 objects 2 2 2 2   Language- repeat 1 1 1 1   Language- follow 3 step command 3 3 2 3   Language- read & follow direction 1 1 1 1   Write a sentence 1 1 0 1  Copy design 1 1 1 1   Total score 27 29 28 30         Immunization History  Administered Date(s) Administered  . Fluad Quad(high Dose 65+) 07/29/2019  . Influenza, Seasonal, Injecte, Preservative Fre 10/14/2012  . Influenza,inj,Quad PF,6+ Mos 09/15/2015, 07/24/2016, 07/28/2017  . Influenza-Unspecified 10/04/2013, 07/29/2018  . PFIZER SARS-COV-2 Vaccination 11/29/2019, 12/20/2019  . Pneumococcal Conjugate-13 04/18/2015  . Pneumococcal Polysaccharide-23 04/06/2013  . Td 02/03/2008  . Zoster Recombinat (Shingrix) 06/10/2018    Screening Tests Health Maintenance  Topic Date Due  . TETANUS/TDAP  07/25/2023 (Originally 02/20/2018)  . INFLUENZA VACCINE  Completed  . PNA vac Low Risk Adult  Completed     Plan:    Please schedule your next medicare wellness visit with me in 1 yr.  Continue to eat heart healthy diet (full of fruits, vegetables, whole grains, lean protein, water--limit salt, fat, and sugar intake) and increase physical activity as tolerated.  Continue doing brain stimulating activities (puzzles,  reading, adult coloring books, staying active) to keep memory sharp.    I have personally reviewed and noted the following in the patient's chart:   . Medical and social history . Use of alcohol, tobacco or illicit drugs  . Current medications and supplements . Functional ability and status . Nutritional status . Physical activity . Advanced directives . List of other physicians . Hospitalizations, surgeries, and ER visits in previous 12 months . Vitals . Screenings to include cognitive, depression, and falls . Referrals and appointments  In addition, I have reviewed and discussed with patient certain preventive protocols, quality metrics, and best practice recommendations. A written personalized care plan for preventive services as well as general preventive health recommendations were provided to patient.     Shela Nevin, [place]  02/02/2020

## 2020-02-02 ENCOUNTER — Encounter: Payer: Self-pay | Admitting: *Deleted

## 2020-02-02 ENCOUNTER — Ambulatory Visit (INDEPENDENT_AMBULATORY_CARE_PROVIDER_SITE_OTHER): Payer: PPO | Admitting: *Deleted

## 2020-02-02 DIAGNOSIS — Z0000 Encounter for general adult medical examination without abnormal findings: Secondary | ICD-10-CM

## 2020-02-02 NOTE — Patient Instructions (Signed)
Please schedule your next medicare wellness visit with me in 1 yr.  Continue to eat heart healthy diet (full of fruits, vegetables, whole grains, lean protein, water--limit salt, fat, and sugar intake) and increase physical activity as tolerated.  Continue doing brain stimulating activities (puzzles, reading, adult coloring books, staying active) to keep memory sharp.    [name] , Thank you for taking time to come for your Medicare Wellness Visit. I appreciate your ongoing commitment to your health goals. Please review the following plan we discussed and let me know if I can assist you in the future.   These are the goals we discussed: Goals    . Maintain healthy life and independence       This is a list of the screening recommended for you and due dates:  Health Maintenance  Topic Date Due  . Tetanus Vaccine  07/25/2023*  . Flu Shot  Completed  . Pneumonia vaccines  Completed  *Topic was postponed. The date shown is not the original due date.    Preventive Care 10 Years and Older, Male Preventive care refers to lifestyle choices and visits with your health care provider that can promote health and wellness. This includes:  A yearly physical exam. This is also called an annual well check.  Regular dental and eye exams.  Immunizations.  Screening for certain conditions.  Healthy lifestyle choices, such as diet and exercise. What can I expect for my preventive care visit? Physical exam Your health care provider will check:  Height and weight. These may be used to calculate body mass index (BMI), which is a measurement that tells if you are at a healthy weight.  Heart rate and blood pressure.  Your skin for abnormal spots. Counseling Your health care provider may ask you questions about:  Alcohol, tobacco, and drug use.  Emotional well-being.  Home and relationship well-being.  Sexual activity.  Eating habits.  History of falls.  Memory and ability to  understand (cognition).  Work and work Statistician. What immunizations do I need?  Influenza (flu) vaccine  This is recommended every year. Tetanus, diphtheria, and pertussis (Tdap) vaccine  You may need a Td booster every 10 years. Varicella (chickenpox) vaccine  You may need this vaccine if you have not already been vaccinated. Zoster (shingles) vaccine  You may need this after age 52. Pneumococcal conjugate (PCV13) vaccine  One dose is recommended after age 44. Pneumococcal polysaccharide (PPSV23) vaccine  One dose is recommended after age 51. Measles, mumps, and rubella (MMR) vaccine  You may need at least one dose of MMR if you were born in 1957 or later. You may also need a second dose. Meningococcal conjugate (MenACWY) vaccine  You may need this if you have certain conditions. Hepatitis A vaccine  You may need this if you have certain conditions or if you travel or work in places where you may be exposed to hepatitis A. Hepatitis B vaccine  You may need this if you have certain conditions or if you travel or work in places where you may be exposed to hepatitis B. Haemophilus influenzae type b (Hib) vaccine  You may need this if you have certain conditions. You may receive vaccines as individual doses or as more than one vaccine together in one shot (combination vaccines). Talk with your health care provider about the risks and benefits of combination vaccines. What tests do I need? Blood tests  Lipid and cholesterol levels. These may be checked every 5 years, or  more frequently depending on your overall health.  Hepatitis C test.  Hepatitis B test. Screening  Lung cancer screening. You may have this screening every year starting at age 94 if you have a 30-pack-year history of smoking and currently smoke or have quit within the past 15 years.  Colorectal cancer screening. All adults should have this screening starting at age 56 and continuing until age 76.  Your health care provider may recommend screening at age 14 if you are at increased risk. You will have tests every 1-10 years, depending on your results and the type of screening test.  Prostate cancer screening. Recommendations will vary depending on your family history and other risks.  Diabetes screening. This is done by checking your blood sugar (glucose) after you have not eaten for a while (fasting). You may have this done every 1-3 years.  Abdominal aortic aneurysm (AAA) screening. You may need this if you are a current or former smoker.  Sexually transmitted disease (STD) testing. Follow these instructions at home: Eating and drinking  Eat a diet that includes fresh fruits and vegetables, whole grains, lean protein, and low-fat dairy products. Limit your intake of foods with high amounts of sugar, saturated fats, and salt.  Take vitamin and mineral supplements as recommended by your health care provider.  Do not drink alcohol if your health care provider tells you not to drink.  If you drink alcohol: ? Limit how much you have to 0-2 drinks a day. ? Be aware of how much alcohol is in your drink. In the [place] one drink equals one 12 oz bottle of beer (355 mL), one 5 oz glass of wine (148 mL), or one 1 oz glass of hard liquor (44 mL). Lifestyle  Take daily care of your teeth and gums.  Stay active. Exercise for at least 30 minutes on 5 or more days each week.  Do not use any products that contain nicotine or tobacco, such as cigarettes, e-cigarettes, and chewing tobacco. If you need help quitting, ask your health care provider.  If you are sexually active, practice safe sex. Use a condom or other form of protection to prevent STIs (sexually transmitted infections).  Talk with your health care provider about taking a low-dose aspirin or statin. What's next?  Visit your health care provider once a year for a well check visit.  Ask your health care provider how often you  should have your eyes and teeth checked.  Stay up to date on all vaccines. This information is not intended to replace advice given to you by your health care provider. Make sure you discuss any questions you have with your health care provider. Document Revised: 10/15/2018 Document Reviewed: 10/15/2018 Elsevier Patient Education  2020 [employer].

## 2020-02-07 ENCOUNTER — Other Ambulatory Visit: Payer: Self-pay

## 2020-02-07 ENCOUNTER — Ambulatory Visit (INDEPENDENT_AMBULATORY_CARE_PROVIDER_SITE_OTHER): Payer: PPO | Admitting: Family Medicine

## 2020-02-07 ENCOUNTER — Encounter: Payer: Self-pay | Admitting: Family Medicine

## 2020-02-07 VITALS — BP 138/72 | HR 83 | Temp 98.3°F | Ht 66.0 in | Wt 149.2 lb

## 2020-02-07 DIAGNOSIS — F341 Dysthymic disorder: Secondary | ICD-10-CM | POA: Insufficient documentation

## 2020-02-07 DIAGNOSIS — G4700 Insomnia, unspecified: Secondary | ICD-10-CM

## 2020-02-07 DIAGNOSIS — Z8739 Personal history of other diseases of the musculoskeletal system and connective tissue: Secondary | ICD-10-CM

## 2020-02-07 MED ORDER — BELSOMRA 10 MG PO TABS: 1.0000 | ORAL_TABLET | Freq: Every evening | ORAL | 1 refills | Status: DC | PRN

## 2020-02-07 MED ORDER — MIRTAZAPINE 45 MG PO TABS: 45.0000 mg | ORAL_TABLET | Freq: Every day | ORAL | 1 refills | Status: DC

## 2020-02-07 NOTE — Progress Notes (Signed)
Established Patient Office Visit  Subjective:  Patient ID: [name], male    DOB: 05/11/1935  Age: 85 y.o. MRN: [id number]  CC:  Chief Complaint  Patient presents with   Follow-up    follow up on medication, would like remerron increased.    HPI [name] presents for follow-up of his history of gout and insomnia.  Sleep doctor has started him on Remeron for sleep.  He has been taking 45 mg for an extended period of time.  He still has some issues obtaining more than 4 hours of sleep.  He has not had a gouty attack since discontinuing allopurinol.  He rarely drinks alcohol.  Past Medical History:  Diagnosis Date   Barrett's esophagus    Cataract    Diverticulosis of colon (without mention of hemorrhage)    Elevated PSA    Esophageal stricture    GERD (gastroesophageal reflux disease)    Glaucoma    Gout    Herpes    Hiatal hernia    History of colon cancer    HOH (hard of hearing)    Hyperlipidemia    Hypersomnia, persistent 11/24/2013   Reports Epworth of 10 and higher on CPAP with  good compliance.    Hypertrophy of prostate without urinary obstruction and other lower urinary tract symptoms (LUTS)    Impotence of organic origin    Insomnia    Memory change 04/19/2013   OSA on CPAP    Prostate cancer ([place])    Prostatitis    Stroke ([place])    tia in  2010   TIA (transient ischemic attack)    Vertigo     Past Surgical History:  Procedure Laterality Date   EYE SURGERY     both cataracts   INTRACAPSULAR CATARACT EXTRACTION     Lt eye   MASS EXCISION N/A 07/29/2013   Procedure: MINOR EXCISION OF CHEST KELOID CONTRACTURE;  Surgeon: Theodoro Kos, DO;  Location: [place];  Service: Plastics;  Laterality: N/A;  ACELL placement   MINOR GRAFT APPLICATION N/A [id number]   Procedure: MINOR GRAFT APPLICATION A CELL;  Surgeon: Theodoro Kos, DO;  Location: [place];  Service: Plastics;  Laterality:  N/A;   REFRACTIVE SURGERY      Family History  Problem Relation Age of Onset   Cirrhosis Mother    Jaundice Mother    Hyperlipidemia Father    Heart disease Father    Colon cancer Neg Hx    Esophageal cancer Neg Hx    Rectal cancer Neg Hx    Stomach cancer Neg Hx     Social History   Socioeconomic History   Marital status: Single    Spouse name: Not on file   Number of children: 2   Years of education: 15   Highest education level: Not on file  Occupational History   Occupation: Retired  Tobacco Use   Smoking status: Former Smoker    Quit date: 07/23/1962    Years since quitting: 57.5   Smokeless tobacco: Never Used   Tobacco comment: Stopped in 1963  Substance and Sexual Activity   Alcohol use: Yes    Alcohol/week: 14.0 standard drinks    Types: 14 Standard drinks or equivalent per week    Comment: 2-3 drinks nightly   Drug use: No   Sexual activity: Yes    Comment: 5 partners  Other Topics Concern   Not on file  Social History Narrative  Patient is single.   Patient has two children.   Patient is retired.   Patient has a college education.   Patient is right-handed.   Patient drinks about 3 cups of coffee daily.   Patient gets regular exercise.   Social Determinants of Health   Financial Resource Strain: Low Risk    Difficulty of Paying Living Expenses: Not hard at all  Food Insecurity: No Food Insecurity   Worried About Charity fundraiser in the Last Year: Never true   [place] in the Last Year: Never true  Transportation Needs: No Transportation Needs   Lack of Transportation (Medical): No   Lack of Transportation (Non-Medical): No  Physical Activity:    Days of Exercise per Week:    Minutes of Exercise per Session:   Stress:    Feeling of Stress :   Social Connections:    Frequency of Communication with Friends and Family:    Frequency of Social Gatherings with Friends and Family:    Attends Religious  Services:    Active Member of Clubs or Organizations:    Attends Music therapist:    Marital Status:   Intimate Partner Violence:    Fear of Current or Ex-Partner:    Emotionally Abused:    Physically Abused:    Sexually Abused:     Outpatient Medications Prior to Visit  Medication Sig Dispense Refill   aspirin 81 MG tablet Take 81 mg by mouth daily.       cycloSPORINE (RESTASIS) 0.05 % ophthalmic emulsion 1 drop 2 (two) times daily. As needed.      donepezil (ARICEPT) 10 MG tablet Take 1 tablet (10 mg total) by mouth at bedtime. 90 tablet 0   finasteride (PROSCAR) 5 MG tablet Take 5 mg by mouth daily.     Multiple Vitamin (MULTIVITAMIN) capsule Take 1 capsule by mouth daily.     pantoprazole (PROTONIX) 40 MG tablet Take 1 tablet (40 mg total) by mouth daily. 30 tablet 11   valACYclovir (VALTREX) 1000 MG tablet TAKE 1/2 TABLET BY MOUTH ONCE DAILY 45 tablet 3   mirtazapine (REMERON) 45 MG tablet Take 1 tablet (45 mg total) by mouth at bedtime. 30 tablet 0   NEXIUM 40 MG capsule TAKE 1 CAPSULE BY MOUTH DAILY BEFORE BREAKFAST. (Patient not taking: Reported on 02/07/2020) 90 capsule 6   Probiotic Product (ALIGN PO) Take 1 capsule by mouth daily.     No facility-administered medications prior to visit.    Allergies  Allergen Reactions   Sulfamethoxazole Other (See Comments)    Constipation   Sulfamethoxazole-Trimethoprim     constipation    ROS Review of Systems  Constitutional: Negative.   HENT: Positive for hearing loss. Negative for drooling, ear discharge and sneezing.   Eyes: Negative for photophobia and visual disturbance.  Respiratory: Negative.   Cardiovascular: Negative.   Gastrointestinal: Negative.   Endocrine: Negative for polyphagia and polyuria.  Genitourinary: Negative.   Musculoskeletal: Negative for gait problem.  Neurological: Negative.  Negative for seizures and speech difficulty.  Hematological: Does not bruise/bleed easily.   Psychiatric/Behavioral: Positive for sleep disturbance.   Depression screen [facility] 2/9 02/07/2020 02/02/2020 07/28/2017  Decreased Interest 0 0 0  Down, Depressed, Hopeless 0 0 0  PHQ - 2 Score 0 0 0  Altered sleeping 2 - -  Tired, decreased energy 0 - -  Change in appetite 0 - -  Feeling bad or failure about yourself  0 - -  Trouble concentrating 1 - -  Moving slowly or fidgety/restless 0 - -  Suicidal thoughts 0 - -  PHQ-9 Score 3 - -  Difficult doing work/chores Not difficult at all - -      Objective:    Physical Exam  Constitutional: He is oriented to person, place, and time. He appears well-developed and well-nourished. No distress.  HENT:  Head: Normocephalic and atraumatic.  Right Ear: Tympanic membrane, external ear and ear canal normal.  Left Ear: Tympanic membrane, external ear and ear canal normal.  Eyes: Conjunctivae are normal. Right eye exhibits no discharge. Left eye exhibits no discharge. No scleral icterus.  Neck: No JVD present. No tracheal deviation present.  Cardiovascular: Normal rate, regular rhythm and normal heart sounds.  Pulmonary/Chest: Effort normal and breath sounds normal. No stridor.  Neurological: He is alert and oriented to person, place, and time.  Skin: Skin is warm and dry. He is not diaphoretic.  Psychiatric: He has a normal mood and affect. His behavior is normal.    BP 138/72    Pulse 83    Temp 98.3 F (36.8 C) (Tympanic)    Ht 5\' 6"  (1.676 m)    Wt 149 lb 3.2 oz (67.7 kg)    SpO2 96%    BMI 24.08 kg/m  Wt Readings from Last 3 Encounters:  02/07/20 149 lb 3.2 oz (67.7 kg)  01/19/20 150 lb (68 kg)  07/29/19 142 lb (64.4 kg)     There are no preventive care reminders to display for this patient.  There are no preventive care reminders to display for this patient.  Lab Results  Component Value Date   TSH 2.340 07/28/2017   Lab Results  Component Value Date   WBC 6.9 07/29/2019   HGB 14.2 07/29/2019   HCT 42.7 07/29/2019   MCV  88.9 07/29/2019   PLT 269.0 07/29/2019   Lab Results  Component Value Date   NA 144 07/29/2019   K 4.8 07/29/2019   CO2 30 07/29/2019   GLUCOSE 89 07/29/2019   BUN 16 07/29/2019   CREATININE 0.81 07/29/2019   BILITOT 0.7 07/29/2019   ALKPHOS 81 07/29/2019   AST 18 07/29/2019   ALT 16 07/29/2019   PROT 6.7 07/29/2019   ALBUMIN 4.4 07/29/2019   CALCIUM 10.5 07/29/2019   GFR 91.20 07/29/2019   Lab Results  Component Value Date   CHOL 174 07/29/2019   Lab Results  Component Value Date   HDL 48.10 07/29/2019   Lab Results  Component Value Date   LDLCALC 111 (H) 07/29/2019   Lab Results  Component Value Date   TRIG 77.0 07/29/2019   Lab Results  Component Value Date   CHOLHDL 4 07/29/2019   No results found for: HGBA1C    Assessment & Plan:   Problem List Items Addressed This Visit      Other   Insomnia, persistent - Primary   Relevant Medications   mirtazapine (REMERON) 45 MG tablet   Suvorexant (BELSOMRA) 10 MG TABS   Dysthymia   Relevant Medications   mirtazapine (REMERON) 45 MG tablet    Other Visit Diagnoses    History of gout       Relevant Orders   Uric acid   CBC   Urinalysis, Routine w reflex microscopic   Basic metabolic panel      Meds ordered this encounter  Medications   mirtazapine (REMERON) 45 MG tablet    Sig: Take 1 tablet (45 mg total) by  mouth at bedtime.    Dispense:  90 tablet    Refill:  1    Pt must keep upcoming march 2021 apt for future refills   Suvorexant (BELSOMRA) 10 MG TABS    Sig: Take 1 tablet by mouth at bedtime as needed.    Dispense:  30 tablet    Refill:  1    Follow-up: No follow-ups on file.   Explained that the lower dose of Remeron may be more beneficial for sleep.  Patient has been taking the 45 mg dose for some time now and is not interested in trying a lower dose.  We will try Belsomra added for sleep. Libby Maw, MD

## 2020-02-08 LAB — CBC
HCT: 40.7 % (ref 39.0–52.0)
Hemoglobin: 13.9 g/dL (ref 13.0–17.0)
MCHC: 34.1 g/dL (ref 30.0–36.0)
MCV: 88.6 fl (ref 78.0–100.0)
Platelets: 268 10*3/uL (ref 150.0–400.0)
RBC: 4.59 Mil/uL (ref 4.22–5.81)
RDW: 13.5 % (ref 11.5–15.5)
WBC: 7.5 10*3/uL (ref 4.0–10.5)

## 2020-02-08 LAB — URINALYSIS, ROUTINE W REFLEX MICROSCOPIC
Bilirubin Urine: NEGATIVE
Hgb urine dipstick: NEGATIVE
Ketones, ur: NEGATIVE
Leukocytes,Ua: NEGATIVE
Nitrite: NEGATIVE
RBC / HPF: NONE SEEN (ref 0–?)
Specific Gravity, Urine: 1.025 (ref 1.000–1.030)
Total Protein, Urine: NEGATIVE
Urine Glucose: NEGATIVE
Urobilinogen, UA: 1 (ref 0.0–1.0)
WBC, UA: NONE SEEN (ref 0–?)
pH: 7 (ref 5.0–8.0)

## 2020-02-08 LAB — BASIC METABOLIC PANEL
BUN: 18 mg/dL (ref 6–23)
CO2: 31 mEq/L (ref 19–32)
Calcium: 10.2 mg/dL (ref 8.4–10.5)
Chloride: 106 mEq/L (ref 96–112)
Creatinine, Ser: 1.16 mg/dL (ref 0.40–1.50)
GFR: 60.18 mL/min (ref >60.00)
Glucose, Bld: 99 mg/dL (ref 70–99)
Potassium: 4.9 mEq/L (ref 3.5–5.1)
Sodium: 142 mEq/L (ref 135–145)

## 2020-02-08 LAB — URIC ACID: Uric Acid, Serum: 7.2 mg/dL (ref 4.0–7.8)

## 2020-02-09 ENCOUNTER — Telehealth: Payer: Self-pay | Admitting: Family Medicine

## 2020-02-09 NOTE — Telephone Encounter (Signed)
Patient is returning the call. CB is [phone]

## 2020-03-02 ENCOUNTER — Telehealth: Payer: Self-pay | Admitting: Family Medicine

## 2020-03-02 NOTE — Progress Notes (Signed)
  Chronic Care Management   Outreach Note  03/02/2020 Name: [name] MRN: [id number] DOB: [birth year]  Referred by: Libby Maw, MD Reason for referral : No chief complaint on file.   An unsuccessful telephone outreach was attempted today. The patient was referred to the pharmacist for assistance with care management and care coordination.   This note is not being shared with the patient for the following reason: To respect privacy (The patient or proxy has requested that the information not be shared).  Follow Up Plan:   Earney [place] [facility]

## 2020-03-06 ENCOUNTER — Telehealth: Payer: Self-pay | Admitting: Family Medicine

## 2020-03-06 DIAGNOSIS — G4710 Hypersomnia, unspecified: Secondary | ICD-10-CM

## 2020-03-06 DIAGNOSIS — E7800 Pure hypercholesterolemia, unspecified: Secondary | ICD-10-CM

## 2020-03-06 NOTE — Progress Notes (Signed)
  Chronic Care Management   Note  03/06/2020 Name: COURY SCHOEPP III MRN: [id number] DOB: 22-Nov-1934  Demetra Shiner Lucado III is a 85 y.o. year old male who is a primary care patient of Libby Maw, MD. I reached out to Melany Guernsey III by phone today in response to a referral sent by Mr. Demetra Shiner Spiewak III's PCP, Libby Maw, MD.   Mr. Trites was given information about Chronic Care Management services today including:  1. CCM service includes personalized support from designated clinical staff supervised by his physician, including individualized plan of care and coordination with other care providers 2. 24/7 contact phone numbers for assistance for urgent and routine care needs. 3. Service will only be billed when office clinical staff spend 20 minutes or more in a month to coordinate care. 4. Only one practitioner may furnish and bill the service in a calendar month. 5. The patient may stop CCM services at any time (effective at the end of the month) by phone call to the office staff.   Patient agreed to services and verbal consent obtained.   This note is not being shared with the patient for the following reason: To respect privacy (The patient or proxy has requested that the information not be shared).  Follow up plan:   Earney Hamburg [facility]

## 2020-04-17 NOTE — Chronic Care Management (AMB) (Signed)
Chronic Care Management Pharmacy  Name: [name]  MRN: [id number] DOB: [birth year]  Chief Complaint/ HPI  [name],  85 y.o. , male presents for their Initial CCM visit with the clinical pharmacist via telephone due to COVID-19 Pandemic.  PCP : Libby Maw, MD  Their chronic conditions include: GERD, Hyperlipidemia, Memory changes, Gout,   Office Visits: 02/07/20: Patient presented to Dr. Ethelene Hal for insomnia follow-up. Patient started on Suvorexant 10 mg QHS PRN  02/02/20: Patient presented to Naaman Plummer, RN for AWV.   Consult Visit: 01/19/20: patient presented to Dr. [name] Fairy (Neurology) for OSA follow-up. Patient sleeping 3-4 hours. BiPAP settings adjusted, no medication changes made.  12/28/19: Patient presented to Dr. Katy Fitch (ophthamology) for ocular hypertension follow-up.   Medications: Outpatient Encounter Medications as of 04/19/2020  Medication Sig Note  . aspirin 81 MG tablet Take 81 mg by mouth daily.     . Cholecalciferol (VITAMIN D3) 50 MCG (2000 UT) TABS Take 2,000 Units by mouth daily.   . cycloSPORINE (RESTASIS) 0.05 % ophthalmic emulsion 1 drop 2 (two) times daily.    Marland Kitchen donepezil (ARICEPT) 10 MG tablet Take 1 tablet (10 mg total) by mouth at bedtime.   . finasteride (PROSCAR) 5 MG tablet Take 5 mg by mouth daily.   . mirtazapine (REMERON) 45 MG tablet Take 1 tablet (45 mg total) by mouth at bedtime.   . Multiple Vitamin (MULTIVITAMIN) capsule Take 1 capsule by mouth daily. 05/09/2014: Received from: [facility] Received Sig:   . pantoprazole (PROTONIX) 40 MG tablet Take 1 tablet (40 mg total) by mouth daily.   Marland Kitchen NEXIUM 40 MG capsule TAKE 1 CAPSULE BY MOUTH DAILY BEFORE BREAKFAST. (Patient not taking: Reported on 02/07/2020)   . Probiotic Product (ALIGN PO) Take 1 capsule by mouth daily.   . Suvorexant (BELSOMRA) 10 MG TABS Take 1 tablet by mouth at bedtime as needed. (Patient not taking: Reported on 04/19/2020)   .  valACYclovir (VALTREX) 1000 MG tablet TAKE 1/2 TABLET BY MOUTH ONCE DAILY (Patient taking differently: 1,000 mg daily as needed. )    No facility-administered encounter medications on file as of 04/19/2020.     Current Diagnosis/Assessment:  Goals Addressed            This Visit's Progress   . Chronic Care Management       CARE PLAN ENTRY  Current Barriers:  . Chronic Disease Management support, education, and care coordination needs related to Hyperlipidemia, GERD, BPH, Gout, and Insmonia   Hyperlipidemia Lab Results  Component Value Date/Time   LDLCALC 111 (H) 07/29/2019 10:04 AM   LDLCALC 120 (H) 07/28/2017 03:04 PM   LDLDIRECT 112.0 07/29/2019 10:04 AM   . Pharmacist Clinical Goal(s): o Over the next 90 days, patient will work with PharmD and providers to achieve LDL goal < 100 . Interventions: o Recommend limiting red meat and incorporating more fresh fruit and vegetables into daily diet. Gout . Pharmacist Clinical Goal(s) o Over the next 90 days, patient will work with PharmD and providers to prevent gout flares and achieve Uric Acid less than 6.  . Current regimen:  o None . Interventions: o Recommend limiting consumption of Purine-Rich foods (red meat, alcohol, organ meat)   Insomnia . Pharmacist Clinical Goal(s) o Over the next 90 days, patient will work with PharmD and providers to help maintain healthy sleep  . Current regimen:  o Mirtazapine 45 mg  o Belsomra 10 mg  .  Interventions: o Pharmacist will investigate cost saving options for Belsomra . Patient self care activities - Over the next 90 days, patient will: o Continue practicing healthy sleep hygiene habits and use CPAP nightly.  Medication management . Pharmacist Clinical Goal(s): o Over the next 90 days, patient will work with PharmD and providers to achieve optimal medication adherence . Current pharmacy: [facility] . Interventions o Comprehensive medication review performed. o Utilize  [facility] for medication synchronization, packaging and delivery  o Verbal consent obtained for [facility] enhanced pharmacy services (medication synchronization, adherence packaging, delivery coordination). A medication sync plan was created to allow patient to get all medications delivered once every 30 to 90 days per patient preference. Patient understands they have freedom to choose pharmacy and clinical pharmacist will coordinate care between all prescribers and [facility].  . Patient self care activities - Over the next 90 days, patient will: o Request new prescriptions be sent to [facility] o Take medications as prescribed o Report any questions, concerns, or medication refills to Pharmacist and/or provider(s)      Hyperlipidemia   History of TIA in 2010  Lipid Panel     Component Value Date/Time   CHOL 174 07/29/2019 1004   CHOL 194 07/28/2017 1504   TRIG 77.0 07/29/2019 1004   HDL 48.10 07/29/2019 1004   HDL 54 07/28/2017 1504   LDLCALC 111 (H) 07/29/2019 1004   LDLCALC 120 (H) 07/28/2017 1504   LDLDIRECT 112.0 07/29/2019 1004    CMP Latest Ref Rng & Units 02/07/2020 07/29/2019 07/30/2018  Glucose 70 - 99 mg/dL 99 89 93  BUN 6 - 23 mg/dL 18 16 17   Creatinine 0.40 - 1.50 mg/dL 1.16 0.81 0.77  Sodium 135 - 145 mEq/L 142 144 145  Potassium 3.5 - 5.1 mEq/L 4.9 4.8 4.7  Chloride 96 - 112 mEq/L 106 105 105  CO2 19 - 32 mEq/L 31 30 32  Calcium 8.4 - 10.5 mg/dL 10.2 10.5 10.9(H)  Total Protein 6.0 - 8.3 g/dL - 6.7 7.1  Total Bilirubin 0.2 - 1.2 mg/dL - 0.7 0.6  Alkaline Phos 39 - 117 U/L - 81 72  AST 0 - 37 U/L - 18 14  ALT 0 - 53 U/L - 16 12   The ASCVD Risk score ([place]., et al., 2013) failed to calculate for the following reasons:   The 2013 ASCVD risk score is only valid for ages 31 to 49   Patient has failed these meds in past: n/a Patient is currently uncontrolled on the following medications:   Aspirin 81 mg daily We discussed:   diet and exercise extensively  Plan  Continue current medications  Mild Cognitive Impairment    Patient has failed these meds in past: n/a Patient is currently controlled on the following medications:   Donepezil 10 mg QHS   We discussed:  Patient states memory is stable, but he is still forgetful. Denies side effects from therapy.   Plan  Continue current medications  BPH   Managed by Franchot Gallo  PSA  Date Value Ref Range Status  04/14/2014 9.13 (H) <=4.00 ng/mL Final    Comment:    Test Methodology: ECLIA PSA (Electrochemiluminescence Immunoassay)   For PSA values from 2.5-4.0, particularly in younger men <62 years old, the AUA and NCCN suggest testing for % Free PSA (3515) and evaluation of the rate of increase in PSA (PSA velocity).   Patient has failed these meds in past: n/a Patient is currently controlled  on the following medications:   Finasteride 5 mg daily   We discussed:  Urinary symptoms stable. Denies frequent nocturia.   Plan  Continue current medications  GERD   History of Barrett's esophagus   Patient has failed these meds in past: Esomeprazole Patient is currently controlled on the following medications:   Pantoprazole 40 mg daily   We discussed: Symptoms well controlled. Patient notes he gets severe symptoms if he skips 2-3 days of his medicine.  Plan  Continue current medications  Insomnia    Patient has failed these meds in past: melatonin  Patient is currently uncontrolled on the following medications:   Belsomra 10 mg QHS PRN   Mirtazapine 45 mg QHS We discussed: Patient never picked up [place] as it was too expensive. Under his insurance this is a tier 4 medicine and costs ~$90/monthly.  Patient getting 4-5 hours sleep most nights. Goes to bed watching TV, but denied difficulties falling asleep. Compliant with CPAP   Plan  Continue current medications  Pharmacist to investigate tier exception or PAP for Belsomra.    HSV   Patient has failed these meds in past: n/a Patient is currently controlled on the following medications:   Valacyclovir 500 mg daily PRN  We discussed:  Hasn't needed to use in some time.   Plan  Continue current medications   Gout   Uric Acid, Serum  Date Value Ref Range Status  02/07/2020 7.2 4.0 - 7.8 mg/dL Final  07/29/2019 6.0 4.0 - 7.8 mg/dL Final  07/30/2018 4.8 4.0 - 7.8 mg/dL Final   Patient has failed these meds in past: Allopurinol Patient is currently controlled on the following medications: None    We discussed:  Last gout flare 2-3 years ago. Drinking alcohol less (1-2x month), but does endorse eating pork most days. Counseled patient on maintaining a low purine and low alcohol diet to prevent another gout flare.  Plan  Continue current medications  Misc/OTC    Restasis 0.5% ophth 1 drop BID  Multivitamin daily  Vitamin D3 2000 units daily  Plan  Continue current medications  Vaccines   Reviewed and discussed patient's vaccination history.    Immunization History  Administered Date(s) Administered  . Fluad Quad(high Dose 65+) 07/29/2019  . Influenza, Seasonal, Injecte, Preservative Fre 10/14/2012  . Influenza,inj,Quad PF,6+ Mos 09/15/2015, 07/24/2016, 07/28/2017  . Influenza-Unspecified 10/04/2013, 07/29/2018  . PFIZER SARS-COV-2 Vaccination 11/29/2019, 12/20/2019  . Pneumococcal Conjugate-13 04/18/2015  . Pneumococcal Polysaccharide-23 04/06/2013  . Td 02/03/2008  . Zoster Recombinat (Shingrix) 06/10/2018    Plan  Recommended patient receive Covid-19 vaccine.  Medication Management   Pt uses [facility] for all medications Uses pill box? Yes Pt endorses 100% compliance, but also states he has multiple bottles of finasteride that have stockpiled.   Plan  Utilize [facility] for medication synchronization, packaging and delivery.   Verbal consent obtained for [facility] enhanced pharmacy services  (medication synchronization, adherence packaging, delivery coordination). A medication sync plan was created to allow patient to get all medications delivered once every 30 to 90 days per patient preference. Patient understands they have freedom to choose pharmacy and clinical pharmacist will coordinate care between all prescribers and [facility].  Follow up: 6 month phone visit  [facility]  [phone]

## 2020-04-18 NOTE — Addendum Note (Signed)
Addended by: Lynda Rainwater on: 04/18/2020 10:23 AM   Modules accepted: Orders

## 2020-04-19 ENCOUNTER — Other Ambulatory Visit: Payer: Self-pay

## 2020-04-19 ENCOUNTER — Other Ambulatory Visit: Payer: Self-pay | Admitting: Neurology

## 2020-04-19 ENCOUNTER — Telehealth: Payer: Self-pay | Admitting: Neurology

## 2020-04-19 ENCOUNTER — Telehealth: Payer: Self-pay | Admitting: Gastroenterology

## 2020-04-19 ENCOUNTER — Ambulatory Visit: Payer: PPO

## 2020-04-19 DIAGNOSIS — M109 Gout, unspecified: Secondary | ICD-10-CM

## 2020-04-19 DIAGNOSIS — E7800 Pure hypercholesterolemia, unspecified: Secondary | ICD-10-CM

## 2020-04-19 DIAGNOSIS — G4700 Insomnia, unspecified: Secondary | ICD-10-CM

## 2020-04-19 DIAGNOSIS — F341 Dysthymic disorder: Secondary | ICD-10-CM

## 2020-04-19 MED ORDER — DONEPEZIL HCL 10 MG PO TABS: 10.0000 mg | ORAL_TABLET | Freq: Every day | ORAL | 3 refills | Status: DC

## 2020-04-19 NOTE — Telephone Encounter (Signed)
Medication has been sent to the pharmacy for the patient.

## 2020-04-19 NOTE — Patient Instructions (Addendum)
Visit Information It was great speaking with you today!  Please let me know if you have any questions about our visit.  Goals Addressed            This Visit's Progress   . Chronic Care Management       CARE PLAN ENTRY  Current Barriers:  . Chronic Disease Management support, education, and care coordination needs related to Hyperlipidemia, GERD, BPH, Gout, and Insmonia   Hyperlipidemia Lab Results  Component Value Date/Time   LDLCALC 111 (H) 07/29/2019 10:04 AM   LDLCALC 120 (H) 07/28/2017 03:04 PM   LDLDIRECT 112.0 07/29/2019 10:04 AM   . Pharmacist Clinical Goal(s): o Over the next 90 days, patient will work with PharmD and providers to achieve LDL goal < 100 . Interventions: o Recommend limiting red meat and incorporating more fresh fruit and vegetables into daily diet. Gout . Pharmacist Clinical Goal(s) o Over the next 90 days, patient will work with PharmD and providers to prevent gout flares and achieve Uric Acid less than 6.  . Current regimen:  o None . Interventions: o Recommend limiting consumption of Purine-Rich foods (red meat, alcohol, organ meat)   Insomnia . Pharmacist Clinical Goal(s) o Over the next 90 days, patient will work with PharmD and providers to help maintain healthy sleep  . Current regimen:  o Mirtazapine 45 mg  o Belsomra 10 mg  . Interventions: o Pharmacist will investigate cost saving options for Belsomra . Patient self care activities - Over the next 90 days, patient will: o Continue practicing healthy sleep hygiene habits and use CPAP nightly.  Medication management . Pharmacist Clinical Goal(s): o Over the next 90 days, patient will work with PharmD and providers to achieve optimal medication adherence . Current pharmacy: [facility] . Interventions o Comprehensive medication review performed. o Utilize [facility] for medication synchronization, packaging and delivery  o Verbal consent obtained for [facility] enhanced  pharmacy services (medication synchronization, adherence packaging, delivery coordination). A medication sync plan was created to allow patient to get all medications delivered once every 30 to 90 days per patient preference. Patient understands they have freedom to choose pharmacy and clinical pharmacist will coordinate care between all prescribers and [facility].  . Patient self care activities - Over the next 90 days, patient will: o Request new prescriptions be sent to [facility] o Take medications as prescribed o Report any questions, concerns, or medication refills to Pharmacist and/or provider(s)       [name] was given information about Chronic Care Management services today including:  1. CCM service includes personalized support from designated clinical staff supervised by his physician, including individualized plan of care and coordination with other care providers 2. 24/7 contact phone numbers for assistance for urgent and routine care needs. 3. Standard insurance, coinsurance, copays and deductibles apply for chronic care management only during months in which we provide at least 20 minutes of these services. Most insurances cover these services at 100%, however patients may be responsible for any copay, coinsurance and/or deductible if applicable. This service may help you avoid the need for more expensive face-to-face services. 4. Only one practitioner may furnish and bill the service in a calendar month. 5. The patient may stop CCM services at any time (effective at the end of the month) by phone call to the office staff.  Patient agreed to services and verbal consent obtained.   The patient verbalized understanding of instructions provided today and agreed to receive a  mailed copy of patient instruction and/or educational materials. Telephone follow up appointment with pharmacy team member scheduled for: 10/19/20 at 1:00 PM   [place] [facility] [place] A low-purine eating plan involves making food choices to limit your intake of purine. Purine is a kind of uric acid. Too much uric acid in your blood can cause certain conditions, such as gout and kidney stones. Eating a low-purine diet can help control these conditions. What are tips for following this plan? Reading food labels   Avoid foods with saturated or Trans fat.  Check the ingredient list of grains-based foods, such as bread and cereal, to make sure that they contain whole grains.  Check the ingredient list of sauces or soups to make sure they do not contain meat or fish.  When choosing soft drinks, check the ingredient list to make sure they do not contain high-fructose corn syrup. Shopping  Buy plenty of fresh fruits and vegetables.  Avoid buying canned or fresh fish.  Buy dairy products labeled as low-fat or nonfat.  Avoid buying premade or processed foods. These foods are often high in fat, salt (sodium), and added sugar. Cooking  Use olive oil instead of butter when cooking. Oils like olive oil, canola oil, and sunflower oil contain healthy fats. Meal planning  Learn which foods do or do not affect you. If you find out that a food tends to cause your gout symptoms to flare up, avoid eating that food. You can enjoy foods that do not cause problems. If you have any questions about a food item, talk with your dietitian or health care provider.  Limit foods high in fat, especially saturated fat. Fat makes it harder for your body to get rid of uric acid.  Choose foods that are lower in fat and are lean sources of protein. General guidelines  Limit alcohol intake to no more than 1 drink a day for nonpregnant women and 2 drinks a day for men. One drink equals 12 oz of beer, 5 oz of wine, or 1 oz of hard liquor. Alcohol can affect the way your body gets rid of uric acid.  Drink plenty of water  to keep your urine clear or pale yellow. Fluids can help remove uric acid from your body.  If directed by your health care provider, take a vitamin C supplement.  Work with your health care provider and dietitian to develop a plan to achieve or maintain a healthy weight. Losing weight can help reduce uric acid in your blood. What foods are recommended? The items listed may not be a complete list. Talk with your dietitian about what dietary choices are best for you. Foods low in purines Foods low in purines do not need to be limited. These include:  All fruits.  All low-purine vegetables, pickles, and olives.  Breads, pasta, rice, cornbread, and popcorn. Cake and other baked goods.  All dairy foods.  Eggs, nuts, and nut butters.  Spices and condiments, such as salt, herbs, and vinegar.  Plant oils, butter, and margarine.  Water, sugar-free soft drinks, tea, coffee, and cocoa.  Vegetable-based soups, broths, sauces, and gravies. Foods moderate in purines Foods moderate in purines should be limited to the amounts listed.   cup of asparagus, cauliflower, spinach, mushrooms, or green peas, each day.  2/3 cup uncooked oatmeal, each day.   cup dry wheat bran or wheat germ, each day.  2-3 ounces of meat or  poultry, each day.  4-6 ounces of shellfish, such as crab, lobster, oysters, or shrimp, each day.  1 cup cooked beans, peas, or lentils, each day.  Soup, broths, or bouillon made from meat or fish. Limit these foods as much as possible. What foods are not recommended? The items listed may not be a complete list. Talk with your dietitian about what dietary choices are best for you. Limit your intake of foods high in purines, including:  Beer and other alcohol.  Meat-based gravy or sauce.  Canned or fresh fish, such as: ? Anchovies, sardines, herring, and tuna. ? Mussels and scallops. ? Codfish, trout, and haddock.  [name].  Organ meats, such as: ? Liver or  kidney. ? Tripe. ? Sweetbreads (thymus gland or pancreas).  Wild Clinical biochemist.  Yeast or yeast extract supplements.  Drinks sweetened with high-fructose corn syrup. Summary  Eating a low-purine diet can help control conditions caused by too much uric acid in the body, such as gout or kidney stones.  Choose low-purine foods, limit alcohol, and limit foods high in fat.  You will learn over time which foods do or do not affect you. If you find out that a food tends to cause your gout symptoms to flare up, avoid eating that food. This information is not intended to replace advice given to you by your health care provider. Make sure you discuss any questions you have with your health care provider. Document Revised: 10/03/2017 Document Reviewed: 12/04/2016 Elsevier Patient Education  2020 [employer].

## 2020-04-19 NOTE — Telephone Encounter (Signed)
[facility] requested a refill for pantoprazole.

## 2020-04-19 NOTE — Telephone Encounter (Signed)
The patient wishes to use [facility] going forward. To help [place] transition the patient to the pharmacy, can you please put in new refills of his chronic medications so that we may fill them for him the next time they are due?   Mirtazapine 45 mg  Suvorexant 10 mg (Controlled)   Thanks,  Doristine Section Clinical Pharmacist Velora Heckler at [facility]  [phone]

## 2020-04-19 NOTE — Telephone Encounter (Signed)
[facility] - has called for a refill on pt's donepezil (ARICEPT) 10 MG tablet

## 2020-04-19 NOTE — Telephone Encounter (Signed)
As I put on prescription denial, pt needs appt for further refills. Will wait for patient to contact office.

## 2020-04-20 MED ORDER — MIRTAZAPINE 45 MG PO TABS: 45.0000 mg | ORAL_TABLET | Freq: Every day | ORAL | 1 refills | Status: DC

## 2020-04-20 NOTE — Addendum Note (Signed)
Addended by: Jon Billings on: 04/20/2020 10:33 AM   Modules accepted: Orders

## 2020-04-27 ENCOUNTER — Other Ambulatory Visit: Payer: Self-pay

## 2020-04-27 DIAGNOSIS — F341 Dysthymic disorder: Secondary | ICD-10-CM

## 2020-04-27 DIAGNOSIS — G4700 Insomnia, unspecified: Secondary | ICD-10-CM

## 2020-06-26 DIAGNOSIS — N401 Enlarged prostate with lower urinary tract symptoms: Secondary | ICD-10-CM | POA: Diagnosis not present

## 2020-06-26 DIAGNOSIS — R350 Frequency of micturition: Secondary | ICD-10-CM | POA: Diagnosis not present

## 2020-06-26 DIAGNOSIS — C61 Malignant neoplasm of prostate: Secondary | ICD-10-CM | POA: Diagnosis not present

## 2020-06-26 DIAGNOSIS — A6000 Herpesviral infection of urogenital system, unspecified: Secondary | ICD-10-CM | POA: Diagnosis not present

## 2020-06-26 DIAGNOSIS — N5201 Erectile dysfunction due to arterial insufficiency: Secondary | ICD-10-CM | POA: Diagnosis not present

## 2020-07-07 ENCOUNTER — Telehealth: Payer: Self-pay

## 2020-07-07 DIAGNOSIS — E7800 Pure hypercholesterolemia, unspecified: Secondary | ICD-10-CM

## 2020-07-07 DIAGNOSIS — M109 Gout, unspecified: Secondary | ICD-10-CM

## 2020-07-07 NOTE — Progress Notes (Addendum)
Chronic Care Management Pharmacy Assistant   Name: [name]  MRN: [id number] DOB: 01-11-1935  Reason for Encounter: Medication Review    PCP : Libby Maw, MD  Allergies:   Allergies  Allergen Reactions  . Sulfamethoxazole Other (See Comments)    Constipation  . Sulfamethoxazole-Trimethoprim     constipation    Medications: Outpatient Encounter Medications as of 07/07/2020  Medication Sig Note  . aspirin 81 MG tablet Take 81 mg by mouth daily.     . Cholecalciferol (VITAMIN D3) 50 MCG (2000 UT) TABS Take 2,000 Units by mouth daily.   . cycloSPORINE (RESTASIS) 0.05 % ophthalmic emulsion 1 drop 2 (two) times daily.    Marland Kitchen donepezil (ARICEPT) 10 MG tablet Take 1 tablet (10 mg total) by mouth at bedtime.   . finasteride (PROSCAR) 5 MG tablet Take 5 mg by mouth daily.   . mirtazapine (REMERON) 45 MG tablet Take 1 tablet (45 mg total) by mouth at bedtime.   . Multiple Vitamin (MULTIVITAMIN) capsule Take 1 capsule by mouth daily. 05/09/2014: Received from: [place]:   . NEXIUM 40 MG capsule TAKE 1 CAPSULE BY MOUTH DAILY BEFORE BREAKFAST. (Patient not taking: Reported on 02/07/2020)   . pantoprazole (PROTONIX) 40 MG tablet Take 1 tablet (40 mg total) by mouth daily.   . Probiotic Product (ALIGN PO) Take 1 capsule by mouth daily.   . Suvorexant (BELSOMRA) 10 MG TABS Take 1 tablet by mouth at bedtime as needed. (Patient not taking: Reported on 04/19/2020)   . valACYclovir (VALTREX) 1000 MG tablet TAKE 1/2 TABLET BY MOUTH ONCE DAILY (Patient taking differently: 1,000 mg daily as needed. )    No facility-administered encounter medications on file as of 07/07/2020.    Current Diagnosis: Patient Active Problem List   Diagnosis Date Noted  . Dysthymia 02/07/2020  . OSA treated with BiPAP 01/19/2020  . Need for influenza vaccination 08/25/2019  . Elevated LDL cholesterol level 07/29/2019  . Healthcare maintenance 07/29/2018  . Gout  07/29/2018  . Benign skin lesion of thoracic region 07/29/2018  . Obstructive sleep apnea treated with bilevel positive airway pressure (BiPAP) 05/01/2017  . Hypersomnia, persistent 11/24/2013  . Sebaceous cyst 07/29/2013  . Insomnia, persistent 07/22/2013  . Abnormal chest percussion 07/22/2013  . Memory change 04/19/2013  . Subjective vision disturbance 04/19/2013  . Other symptoms involving digestive system(787.99) 11/13/2011  . Loss of weight 11/13/2011  . Anal or rectal pain 11/13/2011  . Personal history of colonic polyps 11/13/2011  . HSV 02/16/2010  . DRY EYE SYNDROME 02/16/2010  . ESOPHAGEAL STRICTURE 02/16/2010  . GERD 02/16/2010   Follow-Up:  Pharmacist Review   Reviewed chart for medication changes ahead of medication coordination call.  No OVs, Consults, or hospital visits since last care coordination call/Pharmacist visit.   No medication changes indicated.  BP Readings from Last 3 Encounters:  02/07/20 138/72  01/19/20 (!) 143/73  07/29/19 110/70    No results found for: HGBA1C   Patient obtains medications through Adherence Packaging  30 Days   Last adherence delivery included: (medication name and frequency)   None ID  Patient declined (meds) last month due to PRN use/additional supply on hand.  None ID  Patient is due for next adherence delivery on: 07/14/2020. Called patient and reviewed medications and coordinated delivery.  This delivery to include:  Aspirin 81 mg Daily - bedtime  Vitamin D3 50 MCG Daily- Breakfast  Donepezil 10 mg tablet  Daily -Bedtime  Finasteride 5 mg tablet Daily-Bedtime  Mirtazapine 45 mg tablet Daily-Bedtime  Pantoprazole 40 mg tablet Daily- Breakfast  Patient declined the following medications (meds) due to (reason)  Restasis 0.05% two times Daily- Adequate supply  Multivitamin Capsule-Adequate supply  Nexium 40 mg Capsule-No longer taking  Probiotic -No longer taking  Suvorexant 10 mg tablet-No longer  taking  Valacyclovir 1000 mg tablet- PRN Adequate supply     Patient needs refills for Pantoprazole 40 mg tablet. Refill request sent to Dr. Lynne Leader office.   Confirmed delivery date of 07/14/2020, advised patient that pharmacy will contact them the morning of delivery.   [place] Pharmacist Assistant [phone]

## 2020-07-11 ENCOUNTER — Telehealth: Payer: Self-pay | Admitting: Gastroenterology

## 2020-07-11 NOTE — Telephone Encounter (Signed)
Denied medication in automatic refill request in Epic stating patient needs to schedule appt fore refills. Patient can contact office directly to schedule follow up visit.

## 2020-07-11 NOTE — Telephone Encounter (Signed)
Refill on Protonix

## 2020-08-02 ENCOUNTER — Telehealth: Payer: Self-pay

## 2020-08-02 NOTE — Progress Notes (Signed)
Completed patient analysis for patient current medication.  Patient spends around 416.10 on current medications.

## 2020-08-07 ENCOUNTER — Other Ambulatory Visit: Payer: Self-pay

## 2020-08-08 ENCOUNTER — Ambulatory Visit (INDEPENDENT_AMBULATORY_CARE_PROVIDER_SITE_OTHER): Payer: PPO | Admitting: Family Medicine

## 2020-08-08 ENCOUNTER — Telehealth: Payer: Self-pay

## 2020-08-08 ENCOUNTER — Encounter: Payer: Self-pay | Admitting: Family Medicine

## 2020-08-08 VITALS — BP 124/70 | HR 79 | Temp 98.1°F | Ht 66.0 in | Wt 146.4 lb

## 2020-08-08 DIAGNOSIS — L80 Vitiligo: Secondary | ICD-10-CM | POA: Diagnosis not present

## 2020-08-08 DIAGNOSIS — K219 Gastro-esophageal reflux disease without esophagitis: Secondary | ICD-10-CM | POA: Diagnosis not present

## 2020-08-08 DIAGNOSIS — Z8739 Personal history of other diseases of the musculoskeletal system and connective tissue: Secondary | ICD-10-CM | POA: Diagnosis not present

## 2020-08-08 DIAGNOSIS — E7800 Pure hypercholesterolemia, unspecified: Secondary | ICD-10-CM

## 2020-08-08 MED ORDER — PANTOPRAZOLE SODIUM 40 MG PO TBEC: 40.0000 mg | DELAYED_RELEASE_TABLET | Freq: Every day | ORAL | 4 refills | Status: DC

## 2020-08-08 MED ORDER — PANTOPRAZOLE SODIUM 40 MG PO TBEC: 40.0000 mg | DELAYED_RELEASE_TABLET | Freq: Every day | ORAL | 11 refills | Status: DC

## 2020-08-08 NOTE — Progress Notes (Signed)
Chronic Care Management Pharmacy Assistant   Name: [name]  MRN: [id number] DOB: 04-08-1935  Reason for Encounter: Medication Review  PCP : Libby Maw, MD  Allergies:   Allergies  Allergen Reactions  . Sulfamethoxazole Other (See Comments)    Constipation  . Sulfamethoxazole-Trimethoprim     constipation    Medications: Outpatient Encounter Medications as of 08/08/2020  Medication Sig Note  . aspirin 81 MG tablet Take 81 mg by mouth daily.     . Cholecalciferol (VITAMIN D3) 50 MCG (2000 UT) TABS Take 2,000 Units by mouth daily.   . cycloSPORINE (RESTASIS) 0.05 % ophthalmic emulsion 1 drop 2 (two) times daily.    Marland Kitchen donepezil (ARICEPT) 10 MG tablet Take 1 tablet (10 mg total) by mouth at bedtime.   . finasteride (PROSCAR) 5 MG tablet Take 5 mg by mouth daily.   . mirtazapine (REMERON) 45 MG tablet Take 1 tablet (45 mg total) by mouth at bedtime.   . Multiple Vitamin (MULTIVITAMIN) capsule Take 1 capsule by mouth daily. 05/09/2014: Received from: [place]:   . NEXIUM 40 MG capsule TAKE 1 CAPSULE BY MOUTH DAILY BEFORE BREAKFAST. (Patient not taking: Reported on 02/07/2020)   . pantoprazole (PROTONIX) 40 MG tablet Take 1 tablet (40 mg total) by mouth daily.   . Probiotic Product (ALIGN PO) Take 1 capsule by mouth daily.   . Suvorexant (BELSOMRA) 10 MG TABS Take 1 tablet by mouth at bedtime as needed. (Patient not taking: Reported on 04/19/2020)   . valACYclovir (VALTREX) 1000 MG tablet TAKE 1/2 TABLET BY MOUTH ONCE DAILY (Patient taking differently: 1,000 mg daily as needed. )    No facility-administered encounter medications on file as of 08/08/2020.    Current Diagnosis: Patient Active Problem List   Diagnosis Date Noted  . Dysthymia 02/07/2020  . OSA treated with BiPAP 01/19/2020  . Need for influenza vaccination 08/25/2019  . Elevated LDL cholesterol level 07/29/2019  . Healthcare maintenance 07/29/2018  . Gout  07/29/2018  . Benign skin lesion of thoracic region 07/29/2018  . Obstructive sleep apnea treated with bilevel positive airway pressure (BiPAP) 05/01/2017  . Hypersomnia, persistent 11/24/2013  . Sebaceous cyst 07/29/2013  . Insomnia, persistent 07/22/2013  . Abnormal chest percussion 07/22/2013  . Memory change 04/19/2013  . Subjective vision disturbance 04/19/2013  . Other symptoms involving digestive system(787.99) 11/13/2011  . Loss of weight 11/13/2011  . Anal or rectal pain 11/13/2011  . Personal history of colonic polyps 11/13/2011  . HSV 02/16/2010  . DRY EYE SYNDROME 02/16/2010  . ESOPHAGEAL STRICTURE 02/16/2010  . GERD 02/16/2010     Follow-Up:  Pharmacist Review   Reviewed chart for medication changes ahead of medication coordination call.  No OVs, Consults, or hospital visits since last care coordination call/Pharmacist visit.   08/08/2020 PCP Abelino Derrick  No medication changes indicated   BP Readings from Last 3 Encounters:  02/07/20 138/72  01/19/20 (!) 143/73  07/29/19 110/70    No results found for: HGBA1C   Patient obtains medications through Adherence Packaging  30 Days   Last adherence delivery included: (medication name and frequency)  Aspirin 81 mg Daily - bedtime             Vitamin D3 50 MCG Daily- Breakfast             Donepezil 10 mg tablet Daily -Bedtime             Finasteride  5 mg tablet Daily-Bedtime             Mirtazapine 45 mg tablet Daily-Bedtime             Pantoprazole 40 mg tablet Daily- Breakfast  Patient declined (meds) last month due to PRN use/additional supply on hand.   Restasis 0.05% two times Daily- Adequate supply             Multivitamin Capsule-Adequate supply             Nexium 40 mg Capsule-No longer taking             Probiotic -No longer taking             Suvorexant 10 mg tablet-No longer taking             Valacyclovir 1000 mg tablet- PRN Adequate supply  Patient is due for next adherence delivery on:  08/15/2020. Called patient and reviewed medications and coordinated delivery.  This delivery to include:  Aspirin 81 mg Daily - bedtime             Vitamin D3 50 MCG Daily- Breakfast             Donepezil 10 mg tablet Daily -Bedtime             Finasteride 5 mg tablet Daily-Bedtime             Mirtazapine 45 mg tablet Daily-Bedtime             Pantoprazole 40 mg tablet Daily- Breakfast  Multivitamin Capsule Daily-Breakfast   Patient declined the following medications (meds) due to (reason)   Restasis 0.05% two times Daily- Adequate supply   Nexium 40 mg Capsule-No longer taking             Probiotic -No longer taking             Suvorexant 10 mg tablet-No longer taking             Valacyclovir 1000 mg tablet- PRN Adequate supply   Patient needs refills for None ID.  Confirmed delivery date of 08/15/2020, advised patient that pharmacy will contact them the morning of delivery.  [place] Pharmacist Assistant [phone]

## 2020-08-08 NOTE — Progress Notes (Signed)
Established Patient Office Visit  Subjective:  Patient ID: [name], male    DOB: 1934/11/09  Age: 85 y.o. MRN: [id number]  CC:  Chief Complaint  Patient presents with  . Follow-up    6 month follow up, no concerns.     HPI [name] presents for follow-up of gout, GERD and need for flu vaccine.  Status post both Covid vaccines.  GERD is been controlled with Protonix.  He has had no gouty flares status post discontinuation of allopurinol 6 weeks ago.  He is planning on follow-up with his sleep doctor.  Delia Heady was expensive and did not.  Continues with a higher dose of mirtazapine which is quite effective for his dysthymia.  Past Medical History:  Diagnosis Date  . Barrett's esophagus   . Cataract   . Diverticulosis of colon (without mention of hemorrhage)   . Elevated PSA   . Esophageal stricture   . GERD (gastroesophageal reflux disease)   . Glaucoma   . Gout   . Herpes   . Hiatal hernia   . History of colon cancer   . HOH (hard of hearing)   . Hyperlipidemia   . Hypersomnia, persistent 11/24/2013   Reports Epworth of 10 and higher on CPAP with  good compliance.   . Hypertrophy of prostate without urinary obstruction and other lower urinary tract symptoms (LUTS)   . Impotence of organic origin   . Insomnia   . Memory change 04/19/2013  . OSA on CPAP   . Prostate cancer ([place])   . Prostatitis   . Stroke ([place])    tia in  2010  . TIA (transient ischemic attack)   . Vertigo     Past Surgical History:  Procedure Laterality Date  . EYE SURGERY     both cataracts  . INTRACAPSULAR CATARACT EXTRACTION     Lt eye  . MASS EXCISION N/A 07/29/2013   Procedure: MINOR EXCISION OF CHEST KELOID CONTRACTURE;  Surgeon: Theodoro Kos, DO;  Location: [place];  Service: Plastics;  Laterality: N/A;  ACELL placement  . MINOR GRAFT APPLICATION N/A 9/52/8413   Procedure: MINOR GRAFT APPLICATION A CELL;  Surgeon: Theodoro Kos, DO;  Location: [place];  Service: Plastics;  Laterality: N/A;  . REFRACTIVE SURGERY      Family History  Problem Relation Age of Onset  . Cirrhosis Mother   . Jaundice Mother   . Hyperlipidemia Father   . Heart disease Father   . Colon cancer Neg Hx   . Esophageal cancer Neg Hx   . Rectal cancer Neg Hx   . Stomach cancer Neg Hx     Social History   Socioeconomic History  . Marital status: Single    Spouse name: Not on file  . Number of children: 2  . Years of education: 45  . Highest education level: Not on file  Occupational History  . Occupation: Retired  Tobacco Use  . Smoking status: Former Smoker    Quit date: 07/23/1962    Years since quitting: 58.0  . Smokeless tobacco: Never Used  . Tobacco comment: Stopped in 1963  Substance and Sexual Activity  . Alcohol use: Yes    Alcohol/week: 14.0 standard drinks    Types: 14 Standard drinks or equivalent per week    Comment: 2-3 drinks nightly  . Drug use: No  . Sexual activity: Yes    Comment: 5 partners  Other Topics Concern  .  Not on file  Social History Narrative   Patient is single.   Patient has two children.   Patient is retired.   Patient has a college education.   Patient is right-handed.   Patient drinks about 3 cups of coffee daily.   Patient gets regular exercise.   Social Determinants of Health   Financial Resource Strain: Low Risk   . Difficulty of Paying Living Expenses: Not hard at all  Food Insecurity: No Food Insecurity  . Worried About Charity fundraiser in the Last Year: Never true  . Ran Out of Food in the Last Year: Never true  Transportation Needs: No Transportation Needs  . Lack of Transportation (Medical): No  . Lack of Transportation (Non-Medical): No  Physical Activity:   . Days of Exercise per Week: Not on file  . Minutes of Exercise per Session: Not on file  Stress:   . Feeling of Stress : Not on file  Social Connections:   . Frequency of Communication with Friends and Family: Not  on file  . Frequency of Social Gatherings with Friends and Family: Not on file  . Attends Religious Services: Not on file  . Active Member of Clubs or Organizations: Not on file  . Attends Archivist Meetings: Not on file  . Marital Status: Not on file  Intimate Partner Violence:   . Fear of Current or Ex-Partner: Not on file  . Emotionally Abused: Not on file  . Physically Abused: Not on file  . Sexually Abused: Not on file    Outpatient Medications Prior to Visit  Medication Sig Dispense Refill  . aspirin 81 MG tablet Take 81 mg by mouth daily.      . Cholecalciferol (VITAMIN D3) 50 MCG (2000 UT) TABS Take 2,000 Units by mouth daily.    . cycloSPORINE (RESTASIS) 0.05 % ophthalmic emulsion 1 drop 2 (two) times daily.     Marland Kitchen donepezil (ARICEPT) 10 MG tablet Take 1 tablet (10 mg total) by mouth at bedtime. 90 tablet 3  . finasteride (PROSCAR) 5 MG tablet Take 5 mg by mouth daily.    . mirtazapine (REMERON) 45 MG tablet Take 1 tablet (45 mg total) by mouth at bedtime. 90 tablet 1  . Multiple Vitamin (MULTIVITAMIN) capsule Take 1 capsule by mouth daily.    . Probiotic Product (ALIGN PO) Take 1 capsule by mouth daily.    . valACYclovir (VALTREX) 500 MG tablet SMARTSIG:2 Tablet(s) By Mouth Every 12 Hours    . pantoprazole (PROTONIX) 40 MG tablet Take 1 tablet (40 mg total) by mouth daily. 30 tablet 11  . NEXIUM 40 MG capsule TAKE 1 CAPSULE BY MOUTH DAILY BEFORE BREAKFAST. (Patient not taking: Reported on 02/07/2020) 90 capsule 6  . Suvorexant (BELSOMRA) 10 MG TABS Take 1 tablet by mouth at bedtime as needed. (Patient not taking: Reported on 04/19/2020) 30 tablet 1  . valACYclovir (VALTREX) 1000 MG tablet TAKE 1/2 TABLET BY MOUTH ONCE DAILY (Patient not taking: Reported on 08/08/2020) 45 tablet 3   No facility-administered medications prior to visit.    Allergies  Allergen Reactions  . Sulfamethoxazole Other (See Comments)    Constipation  . Sulfamethoxazole-Trimethoprim      constipation    ROS Review of Systems  Constitutional: Negative.   HENT: Negative.   Eyes: Negative for photophobia and visual disturbance.  Respiratory: Negative.   Cardiovascular: Negative.   Gastrointestinal: Negative.   Endocrine: Negative for polyphagia and polyuria.  Genitourinary: Negative.  Musculoskeletal: Negative.   Skin: Negative for pallor and rash.  Allergic/Immunologic: Negative for immunocompromised state.  Neurological: Negative for light-headedness and numbness.  Hematological: Does not bruise/bleed easily.  Psychiatric/Behavioral: Negative.       Objective:    Physical Exam Vitals and nursing note reviewed.  Constitutional:      General: He is not in acute distress.    Appearance: Normal appearance. He is normal weight. He is not ill-appearing, toxic-appearing or diaphoretic.  HENT:     Head: Normocephalic and atraumatic.     Right Ear: External ear normal.     Left Ear: External ear normal.  Eyes:     General: No scleral icterus.       Right eye: No discharge.        Left eye: No discharge.     Extraocular Movements: Extraocular movements intact.     Conjunctiva/sclera: Conjunctivae normal.     Pupils: Pupils are equal, round, and reactive to light.  Cardiovascular:     Rate and Rhythm: Normal rate and regular rhythm.  Pulmonary:     Effort: Pulmonary effort is normal.     Breath sounds: Normal breath sounds.  Abdominal:     General: Bowel sounds are normal.  Musculoskeletal:     Cervical back: No rigidity or tenderness.  Lymphadenopathy:     Cervical: No cervical adenopathy.  Skin:    General: Skin is warm and dry.  Neurological:     Mental Status: He is alert and oriented to person, place, and time.  Psychiatric:        Mood and Affect: Mood normal.        Behavior: Behavior normal.     BP 124/70   Pulse 79   Temp 98.1 F (36.7 C) (Tympanic)   Ht 5\' 6"  (1.676 m)   Wt 146 lb 6.4 oz (66.4 kg)   SpO2 95%   BMI 23.63 kg/m  Wt  Readings from Last 3 Encounters:  08/08/20 146 lb 6.4 oz (66.4 kg)  02/07/20 149 lb 3.2 oz (67.7 kg)  01/19/20 150 lb (68 kg)     Health Maintenance Due  Topic Date Due  . INFLUENZA VACCINE  06/04/2020    There are no preventive care reminders to display for this patient.  Lab Results  Component Value Date   TSH 2.340 07/28/2017   Lab Results  Component Value Date   WBC 7.5 02/07/2020   HGB 13.9 02/07/2020   HCT 40.7 02/07/2020   MCV 88.6 02/07/2020   PLT 268.0 02/07/2020   Lab Results  Component Value Date   NA 142 02/07/2020   K 4.9 02/07/2020   CO2 31 02/07/2020   GLUCOSE 99 02/07/2020   BUN 18 02/07/2020   CREATININE 1.16 02/07/2020   BILITOT 0.7 07/29/2019   ALKPHOS 81 07/29/2019   AST 18 07/29/2019   ALT 16 07/29/2019   PROT 6.7 07/29/2019   ALBUMIN 4.4 07/29/2019   CALCIUM 10.2 02/07/2020   GFR 60.18 02/07/2020   Lab Results  Component Value Date   CHOL 174 07/29/2019   Lab Results  Component Value Date   HDL 48.10 07/29/2019   Lab Results  Component Value Date   LDLCALC 111 (H) 07/29/2019   Lab Results  Component Value Date   TRIG 77.0 07/29/2019   Lab Results  Component Value Date   CHOLHDL 4 07/29/2019   No results found for: HGBA1C    Assessment & Plan:   Problem List Items Addressed  This Visit      Digestive   Gastroesophageal reflux disease   Relevant Medications   pantoprazole (PROTONIX) 40 MG tablet     Musculoskeletal and Integument   Vitiligo     Other   History of gout - Primary   Relevant Orders   Basic metabolic panel   Uric acid      Meds ordered this encounter  Medications  . DISCONTD: pantoprazole (PROTONIX) 40 MG tablet    Sig: Take 1 tablet (40 mg total) by mouth daily.    Dispense:  30 tablet    Refill:  11  . pantoprazole (PROTONIX) 40 MG tablet    Sig: Take 1 tablet (40 mg total) by mouth daily.    Dispense:  90 tablet    Refill:  4    Follow-up: Return in about 6 months (around 02/06/2021).     Libby Maw, MD

## 2020-08-14 ENCOUNTER — Telehealth: Payer: Self-pay

## 2020-08-14 ENCOUNTER — Other Ambulatory Visit (INDEPENDENT_AMBULATORY_CARE_PROVIDER_SITE_OTHER): Payer: PPO

## 2020-08-14 ENCOUNTER — Ambulatory Visit (INDEPENDENT_AMBULATORY_CARE_PROVIDER_SITE_OTHER): Payer: PPO

## 2020-08-14 ENCOUNTER — Other Ambulatory Visit: Payer: Self-pay

## 2020-08-14 DIAGNOSIS — Z23 Encounter for immunization: Secondary | ICD-10-CM

## 2020-08-14 DIAGNOSIS — Z8739 Personal history of other diseases of the musculoskeletal system and connective tissue: Secondary | ICD-10-CM | POA: Diagnosis not present

## 2020-08-14 NOTE — Progress Notes (Signed)
Per orders of Dr Ethelene Hal, injection Influenza  given by Armandina Gemma, cma.  Patient tolerated injection well.

## 2020-08-14 NOTE — Addendum Note (Signed)
Addended by: Lynnea Ferrier on: 08/14/2020 03:58 PM   Modules accepted: Orders

## 2020-08-14 NOTE — Addendum Note (Signed)
Addended by: Jon Billings on: 08/14/2020 03:54 PM   Modules accepted: Orders

## 2020-08-14 NOTE — Telephone Encounter (Signed)
Hey Dr Ethelene Hal,  When patient was in to get his flu shot and blood work done today, he asked about the second shingles vaccine.  I advised that I would ask and get back to him.   Please review and advise.  Thanks. Dm/cma

## 2020-08-15 LAB — BASIC METABOLIC PANEL
BUN: 22 mg/dL (ref 6–23)
CO2: 32 mEq/L (ref 19–32)
Calcium: 10.6 mg/dL — ABNORMAL HIGH (ref 8.4–10.5)
Chloride: 104 mEq/L (ref 96–112)
Creatinine, Ser: 0.93 mg/dL (ref 0.40–1.50)
GFR: 75.54 mL/min (ref >60.00)
Glucose, Bld: 97 mg/dL (ref 70–99)
Potassium: 4.9 mEq/L (ref 3.5–5.1)
Sodium: 143 mEq/L (ref 135–145)

## 2020-08-15 LAB — URINALYSIS, ROUTINE W REFLEX MICROSCOPIC
Bilirubin Urine: NEGATIVE
Hgb urine dipstick: NEGATIVE
Ketones, ur: NEGATIVE
Leukocytes,Ua: NEGATIVE
Nitrite: NEGATIVE
RBC / HPF: NONE SEEN (ref 0–?)
Specific Gravity, Urine: 1.02 (ref 1.000–1.030)
Total Protein, Urine: NEGATIVE
Urine Glucose: NEGATIVE
Urobilinogen, UA: 0.2 (ref 0.0–1.0)
pH: 7 (ref 5.0–8.0)

## 2020-08-15 LAB — URIC ACID: Uric Acid, Serum: 6.2 mg/dL (ref 4.0–7.8)

## 2020-08-31 ENCOUNTER — Encounter: Payer: Self-pay | Admitting: Neurology

## 2020-08-31 ENCOUNTER — Ambulatory Visit: Payer: PPO | Admitting: Neurology

## 2020-08-31 VITALS — BP 135/75 | HR 96 | Ht 66.0 in | Wt 146.0 lb

## 2020-08-31 DIAGNOSIS — G4700 Insomnia, unspecified: Secondary | ICD-10-CM | POA: Diagnosis not present

## 2020-08-31 DIAGNOSIS — G4733 Obstructive sleep apnea (adult) (pediatric): Secondary | ICD-10-CM

## 2020-08-31 DIAGNOSIS — R413 Other amnesia: Secondary | ICD-10-CM

## 2020-08-31 DIAGNOSIS — C61 Malignant neoplasm of prostate: Secondary | ICD-10-CM | POA: Diagnosis not present

## 2020-08-31 MED ORDER — DONEPEZIL HCL 10 MG PO TABS: 10.0000 mg | ORAL_TABLET | Freq: Every day | ORAL | 3 refills | Status: DC

## 2020-08-31 NOTE — Progress Notes (Signed)
[facility]  PATIENT: [name] DOB: [birth year]   REASON FOR VISIT: Follow-up obstructive sleep apnea on BiPAP HISTORY FROM: Patient alone.     HISTORY OF PRESENT ILLNESS:  08-31-2020- RV  Interval history for [name]. [name], an 85 year -old caucasian sleep patient.   He presents today with an 83% compliance 4 days and 78% compliance by hours using his machine on days used average 6 hours 19 minutes.  He is using IV although with an inspiratory pressure setting of 17 expiratory pressure setting of 13 and is a brief function, his residual AHI is 0.8 which speaks for an excellent resolution , 95th percentile air leak  is 11.4L/minute. he sleeps without dentures.  So I am very happy with his compliance and the results of his BiPAP therapy.  Upon the patient's request and concern about subjective memory loss he was today undergoing a Montreal cognitive assessment.  This showed short-term memory trouble. Epworth at 6 points/ 24 .   He scored 26 out of 30 points on his [place] fully oriented able to abstract, calculate, name animals and perform visual-spatial maneuvers.  He had some trouble with trouble with the Trail Making Test which is common when encountered first time and he did not recall on first try 3 out of 5 of his recall words.   So this is fairly similar to his MMSE results which also were always showing short term memory difficulties for words only.  MMSE - Mini Mental State Exam 11/25/2016 05/21/2016 11/20/2015 05/16/2015  Orientation to time 5 5 5 5   Orientation to Place 5 5 5 5   Registration 3 3 3 3   Attention/ Calculation 3 4 5 5   Recall 2 3 3 3   Language- name 2 objects 2 2 2 2   Language- repeat 1 1 1 1   Language- follow 3 step command 3 3 2 3   Language- read & follow direction 1 1 1 1   Write a sentence 1 1 0 1  Copy design 1 1 1 1   Total score 27 29 28 30    Montreal Cognitive Assessment  08/31/2020  Visuospatial/ Executive (0/5) 4    Naming (0/3) 3  Attention: Read list of digits (0/2) 2  Attention: Read list of letters (0/1) 1  Attention: Serial 7 subtraction starting at 100 (0/3) 3  Language: Repeat phrase (0/2) 2  Language : Fluency (0/1) 1  Abstraction (0/2) 2  Delayed Recall (0/5) 2  Orientation (0/6) 6  Total 26     01-19-2020:  I have the pleasure of seeing David. [name] today again he is an established BiPAP user and in his last visit was me we were concerned about her slowly rising apnea hypopnea index.  I adjusted the settings to currently 17/13 centimeters water pressure and his AHI is now 4.9 which is a satisfying result.  There are no central apneas emerging anymore.  He is highly compliant and on average he uses the machine for 7 hours and 30 minutes at night, he is 87% compliant with his days he had a.  Of 3 days where he could not use the machine.  Overall previous months were at 100% compliance.  He has no major air leakage his fatigue score was endorsed at 10 out of 63 points his sleepiness score at 4 out of 24 points.  There is 0 there is 1 point on his depression score out of 15 possible points. He is living alone, has two adult  children, a daughter and a son. He is sleeping less long , wakes up after 3-4 hours and would like an increase in meds. He has invested in Scientist, research (life sciences) estate and is not financially strained.  BP is elevated today. Aricept has been taken for 5 years upon recommendation of a pharmacist friend.    I have the pleasure of meeting today with [name], who has followed as here for BiPAP compliance for long time.  The patient presents again with excellent compliance of 97%, 29 out of 30 days with an average user time of 6 hours 35 minutes, inspiratory pressure is set to 13 expiratory pressure to 10 cmH2O with a residual AHI of 19.8 obstructive apneas.  There were no air leaks and I can really not explained this  high apnea number.  He reports that it is not troublesome for him to  breathe, he is compliant because he likes to use his BiPAP, and I wonder if we have to increase his pressure by about 2 cmH2O.  Last AHI was 14/h.  He reports having GERD and related apneas are a possibility, he had esophageal stretching procedures with GI. He remains hoarse.  I like for him to have a new BiPAP titration.     UPDATE 05/28/2018 [name], 85 year old male returns for follow-up for BiPAP compliance.  He continues to have compliance greater than 4 hours at 83%.  Average usage 6 hours 5 minutes.  Set pressure 9 to 13 cm.  Leak 95th percentile 4.1 AHI 14.4/h  which is much improved from last visit.  No central apneas, his  ESS 4/ 24 .  He still says he is not sleeping enough that he did get a wedge and that has helped with his GERD.   He returns for reevaluation  Interval history from 24 September 2017,CD I have the pleasure of meeting today with [name] after a one-year hiatus.  The patient has been followed for CPAP compliance in our office for several years now.  He has a high compliance again 87% of the time, average use of time is 5 hours and 31 minutes, he is using a VPAP auto machine inspiratory pressure is set at 19 cm water expiratory pressure at 15, he does not have detectable air leaks but still high a number of obstructive apneas which has bothered me for years.  He has not noticed a difference between BiPAP and CPAP.  There has been a trend over the last 30 days to higher AHI is from about 10 on average each night to almost 40, and this is not corresponding to air leaks, usage time there has been no adjustment in pressure. The BiPAP's consistently good airflow pattern and data for respiratory rate contradict the measured residual AHI of over 30. He has severe GERD- could that be the  Artefact ? - yes, oesophageal pressure will induce a reading of OSA, he is hoarse , too.  He should change to an elevated pillow, wedge .    UPDATE 9/28 /2018[name], 85 year old male  returns for follow-up with history of obstructive sleep apnea on BiPAP. He feels he is doing well however compliance data actually worse than 3 months ago. Data dated 07/02/2017 through 9/27/ 2018 Shows 90% compliance greater than 4 hours. Average usage 6 hours 18 minutes. Pressure set 13-17 cm. AHI 32.7 central apneas 0.3. Obstructive apneas 29.2. Patient also feels he has a Leak however this is not evident on the report.Marland Kitchen He is  wondering if his Remeron can be increased. He returns for reevaluation  UPDATE 6/28/ 2018[name], 85 year old male returns for follow-up with history of obstructive sleep apnea. He was on CPAP when last seen he was switched to BiPAP due to high residual apnea index. He feels that he is doing well on BiPAP Compliance data reviewed today for 30 days from 03/31/2017 2 04/29/2017 100% compliance for 30 days greater than 4 hours. Average usage 7 hours 14 minutes. Pressure set 16 cm/12 cm of pressure. AHI 21.3. Central apneas 0.3. Obstructive apneas 17.2. He returns to discuss this download.  01/30/17 MM- [name] is a 85 year old male with a history of memory disturbance and obstructive sleep apnea on CPAP. He returns today for a compliance download. At the last visit his pressure was increased. His download indicates that he uses his machine 60/60 days for compliance of 100%. He uses machine greater than 4 hours 32/60 days for compliance of 53%. On average he uses his machine 4 hours and 7 minutes. He is on auto set with a minimum pressure of 6 cm water and maximum pressure of 20 cm of water with EPR 2. His average pressure is 18.7 cm/m. His residual AHI is 25.1/h. He returns today to discuss his download.   11/25/2016: [name] is a 85 year old male with a history of memory disturbance and obstructive sleep apnea on CPAP. He returns today for follow-up. The patient's CPAP download indicates that he uses machine 28 out of 30 days for compliance of 93%. He only uses machine  greater than 4 hour 12 out of 30 days for compliance of 40%. His residual AHI is 24.8 on a minimum pressure of 6cm of water with a maximum pressure 15 cmof water. His leak in the 95th percentile is 24.9 L/m. The patient's last download was approximately 2 years ago. At that time the download showed excellent treatment of his apnea. The patient feels that his memory has remained stable. He livesat home alone. He is able to complete all ADLs independently. He operates a Teacher, music without difficulty. He is able to manage his finances and prepares all meals without difficulty. He returns today for an evaluation.   REVIEW OF SYSTEMS: Full 14 system review of systems performed and notable only for those listed, all others are neg:   Obstructive sleep apnea, treated  with BiPAP. On Aricept for subjective memory problems.   How likely are you to doze in the following situations: 0 = not likely, 1 = slight chance, 2 = moderate chance, 3 = high chance  Sitting and Reading? Watching Television? Sitting inactive in a public place (theater or meeting)? Lying down in the afternoon when circumstances permit? Sitting and talking to someone? Sitting quietly after lunch without alcohol? In a car, while stopped for a few minutes in traffic? As a passenger in a car for an hour without a break?  Total = 6     ALLERGIES: Allergies  Allergen Reactions  . Sulfamethoxazole Other (See Comments)    Constipation  . Sulfamethoxazole-Trimethoprim     constipation    HOME MEDICATIONS: Outpatient Medications Prior to Visit  Medication Sig Dispense Refill  . aspirin 81 MG tablet Take 81 mg by mouth daily.      . Cholecalciferol (VITAMIN D3) 50 MCG (2000 UT) TABS Take 2,000 Units by mouth daily.    . cycloSPORINE (RESTASIS) 0.05 % ophthalmic emulsion 1 drop 2 (two) times daily.     Marland Kitchen donepezil (ARICEPT) 10  MG tablet Take 1 tablet (10 mg total) by mouth at bedtime. 90 tablet 3  . finasteride (PROSCAR) 5 MG  tablet Take 5 mg by mouth daily.    . mirtazapine (REMERON) 45 MG tablet Take 1 tablet (45 mg total) by mouth at bedtime. 90 tablet 1  . Multiple Vitamin (MULTIVITAMIN) capsule Take 1 capsule by mouth daily.    . pantoprazole (PROTONIX) 40 MG tablet Take 1 tablet (40 mg total) by mouth daily. 90 tablet 4  . Probiotic Product (ALIGN PO) Take 1 capsule by mouth daily.    . valACYclovir (VALTREX) 500 MG tablet SMARTSIG:2 Tablet(s) By Mouth Every 12 Hours     No facility-administered medications prior to visit.    PAST MEDICAL HISTORY: Past Medical History:  Diagnosis Date  . Barrett's esophagus   . Cataract   . Diverticulosis of colon (without mention of hemorrhage)   . Elevated PSA   . Esophageal stricture   . GERD (gastroesophageal reflux disease)   . Glaucoma   . Gout   . Herpes   . Hiatal hernia   . History of colon cancer   . HOH (hard of hearing)   . Hyperlipidemia   . Hypersomnia, persistent 11/24/2013   Reports Epworth of 10 and higher on CPAP with  good compliance.   . Hypertrophy of prostate without urinary obstruction and other lower urinary tract symptoms (LUTS)   . Impotence of organic origin   . Insomnia   . Memory change 04/19/2013  . OSA on CPAP   . Prostate cancer ([place])   . Prostatitis   . Stroke ([place])    tia in  2010  . TIA (transient ischemic attack)   . Vertigo     PAST SURGICAL HISTORY: Past Surgical History:  Procedure Laterality Date  . EYE SURGERY     both cataracts  . INTRACAPSULAR CATARACT EXTRACTION     Lt eye  . MASS EXCISION N/A 07/29/2013   Procedure: MINOR EXCISION OF CHEST KELOID CONTRACTURE;  Surgeon: Theodoro Kos, DO;  Location: [place];  Service: Plastics;  Laterality: N/A;  ACELL placement  . MINOR GRAFT APPLICATION N/A 7/61/6073   Procedure: MINOR GRAFT APPLICATION A CELL;  Surgeon: Theodoro Kos, DO;  Location: [place];  Service: Plastics;  Laterality: N/A;  . REFRACTIVE SURGERY      FAMILY  HISTORY: Family History  Problem Relation Age of Onset  . Cirrhosis Mother   . Jaundice Mother   . Hyperlipidemia Father   . Heart disease Father   . Colon cancer Neg Hx   . Esophageal cancer Neg Hx   . Rectal cancer Neg Hx   . Stomach cancer Neg Hx     SOCIAL HISTORY: Social History   Socioeconomic History  . Marital status: Single    Spouse name: Not on file  . Number of children: 2  . Years of education: 41  . Highest education level: Not on file  Occupational History  . Occupation: Retired  Tobacco Use  . Smoking status: Former Smoker    Quit date: 07/23/1962    Years since quitting: 58.1  . Smokeless tobacco: Never Used  . Tobacco comment: Stopped in 1963  Substance and Sexual Activity  . Alcohol use: Yes    Alcohol/week: 14.0 standard drinks    Types: 14 Standard drinks or equivalent per week    Comment: 2-3 drinks nightly  . Drug use: No  . Sexual activity: Yes    Comment:  5 partners  Other Topics Concern  . Not on file  Social History Narrative   Patient is single.   Patient has two children.   Patient is retired.   Patient has a college education.   Patient is right-handed.   Patient drinks about 3 cups of coffee daily.   Patient gets regular exercise.   Social Determinants of Health   Financial Resource Strain: Low Risk   . Difficulty of Paying Living Expenses: Not hard at all  Food Insecurity: No Food Insecurity  . Worried About Charity fundraiser in the Last Year: Never true  . Ran Out of Food in the Last Year: Never true  Transportation Needs: No Transportation Needs  . Lack of Transportation (Medical): No  . Lack of Transportation (Non-Medical): No  Physical Activity:   . Days of Exercise per Week: Not on file  . Minutes of Exercise per Session: Not on file  Stress:   . Feeling of Stress : Not on file  Social Connections:   . Frequency of Communication with Friends and Family: Not on file  . Frequency of Social Gatherings with Friends  and Family: Not on file  . Attends Religious Services: Not on file  . Active Member of Clubs or Organizations: Not on file  . Attends Archivist Meetings: Not on file  . Marital Status: Not on file  Intimate Partner Violence:   . Fear of Current or Ex-Partner: Not on file  . Emotionally Abused: Not on file  . Physically Abused: Not on file  . Sexually Abused: Not on file     PHYSICAL EXAM  Vitals:   08/31/20 1535  BP: 135/75  Pulse: 96  Weight: 146 lb (66.2 kg)  Height: 5\' 6"  (1.676 m)   Body mass index is 23.57 kg/m.  Generalized: Well developed, in no acute distress  Head: normocephalic and atraumatic,  Oropharynx benign mallompatti 2-3 Neck: Supple, circumference 14 inches Musculoskeletal: No deformity  Skin no edema Neurological examination   Mentation: Alert oriented to time, place, history taking.  Attention span and concentration appropriate.  Recent and remote memory intact.- he is on aricept (!)   Follows all commands speech and language fluent.   Cranial nerve : .no loss of Taste, but slowly declining sense of smell .  Pupils were equal round reactive to light, with normal  extraocular movements were full, visual field were full on confrontational test.  Facial sensation and strength were normal.  Hearing remains impaired bilaterally.  Uvula and tongue move in midline. head turning and shoulder shrug were normal and symmetric.Tongue protrusion into cheek strength was normal. Motor: normal bulk and tone, full strength in the BUE, BLE,  Sensory: normal and symmetric to light touch,  Coordination: finger-nose-without dysmetria and without tremor.no  ataxia.  Gait and Station: Rising up from seated position without assistance, normal stance,  moderate stride, good arm swing,  smooth turning with 3 steps.DTR symmetric.   DIAGNOSTIC DATA (LABS, IMAGING, TESTING) - I reviewed patient records, labs, notes, testing and imaging myself where  available.  Lab Results  Component Value Date   WBC 7.5 02/07/2020   HGB 13.9 02/07/2020   HCT 40.7 02/07/2020   MCV 88.6 02/07/2020   PLT 268.0 02/07/2020      Component Value Date/Time   NA 143 08/14/2020 1538   NA 142 07/28/2017 1504   K 4.9 08/14/2020 1538   CL 104 08/14/2020 1538   CO2 32 08/14/2020 1538   GLUCOSE  97 08/14/2020 1538   BUN 22 08/14/2020 1538   BUN 14 07/28/2017 1504   CREATININE 0.93 08/14/2020 1538   CREATININE 0.78 07/24/2016 1432   CALCIUM 10.6 (H) 08/14/2020 1538   PROT 6.7 07/29/2019 1004   PROT 7.0 07/28/2017 1504   ALBUMIN 4.4 07/29/2019 1004   ALBUMIN 4.7 07/28/2017 1504   AST 18 07/29/2019 1004   ALT 16 07/29/2019 1004   ALKPHOS 81 07/29/2019 1004   BILITOT 0.7 07/29/2019 1004   BILITOT 0.5 07/28/2017 1504   GFRNONAA 79 07/28/2017 1504   GFRNONAA 87 04/18/2015 0826   GFRAA 92 07/28/2017 1504   GFRAA >89 04/18/2015 0826   Lab Results  Component Value Date   CHOL 174 07/29/2019   HDL 48.10 07/29/2019   LDLCALC 111 (H) 07/29/2019   LDLDIRECT 112.0 07/29/2019   TRIG 77.0 07/29/2019   CHOLHDL 4 07/29/2019    Lab Results  Component Value Date   YDXAJOIN86 767 07/24/2016   Lab Results  Component Value Date   TSH 2.340 07/28/2017      ASSESSMENT AND PLAN:  [name] is   85 y.o. year-old male , who has only recently returned to the gym and feels deconditioned. His  past medical history is that of :  Obstructive sleep apnea on BiPAP,  subjective memory problems, his MMSE and MOCA indicate at best mild cognitive impairment, all trouble is short term memory.  and his pharmacist and friend recommended aricept and he has taken it for years. Newly reported is loss of smell. He didn't get COVID and is vaccinated now.   BiPAP data reviewed 04/27/2018 -05/26/2018 shows compliance greater than 4 hours at 87%.   Average user time is 7 h 30 min.   PLAN:  BIPAP - Keep pressure to 17/13 cm.    Re- order supplies F/U in 12  months-  Refilled Aricept and recommended to discuss REMERON increase with dr Ethelene Hal. Failed Belsomra,melatonin and is too old for Ambien.     [facility] [address], [place], [place] [address] [phone]

## 2020-08-31 NOTE — Patient Instructions (Signed)

## 2020-09-01 DIAGNOSIS — G4733 Obstructive sleep apnea (adult) (pediatric): Secondary | ICD-10-CM | POA: Diagnosis not present

## 2020-09-07 ENCOUNTER — Telehealth: Payer: Self-pay

## 2020-09-07 DIAGNOSIS — E7800 Pure hypercholesterolemia, unspecified: Secondary | ICD-10-CM

## 2020-09-07 DIAGNOSIS — K219 Gastro-esophageal reflux disease without esophagitis: Secondary | ICD-10-CM

## 2020-09-07 NOTE — Progress Notes (Signed)
Chronic Care Management Pharmacy Assistant   Name: [name]  MRN: [id number] DOB: 1934-11-18  Reason for Encounter: Medication Review  PCP : Libby Maw, MD  Allergies:   Allergies  Allergen Reactions  . Sulfamethoxazole Other (See Comments)    Constipation  . Sulfamethoxazole-Trimethoprim     constipation    Medications: Outpatient Encounter Medications as of 09/07/2020  Medication Sig Note  . aspirin 81 MG tablet Take 81 mg by mouth daily.     . Cholecalciferol (VITAMIN D3) 50 MCG (2000 UT) TABS Take 2,000 Units by mouth daily.   . cycloSPORINE (RESTASIS) 0.05 % ophthalmic emulsion 1 drop 2 (two) times daily.    Marland Kitchen donepezil (ARICEPT) 10 MG tablet Take 1 tablet (10 mg total) by mouth at bedtime.   . finasteride (PROSCAR) 5 MG tablet Take 5 mg by mouth daily.   . mirtazapine (REMERON) 45 MG tablet Take 1 tablet (45 mg total) by mouth at bedtime.   . Multiple Vitamin (MULTIVITAMIN) capsule Take 1 capsule by mouth daily. 05/09/2014: Received from: [facility] Received Sig:   . pantoprazole (PROTONIX) 40 MG tablet Take 1 tablet (40 mg total) by mouth daily.   . Probiotic Product (ALIGN PO) Take 1 capsule by mouth daily.   . valACYclovir (VALTREX) 500 MG tablet SMARTSIG:2 Tablet(s) By Mouth Every 12 Hours    No facility-administered encounter medications on file as of 09/07/2020.    Current Diagnosis: Patient Active Problem List   Diagnosis Date Noted  . Prostate cancer ([place]) 08/31/2020  . Vitiligo 08/08/2020  . History of gout 08/08/2020  . Dysthymia 02/07/2020  . OSA treated with BiPAP 01/19/2020  . Need for influenza vaccination 08/25/2019  . Elevated LDL cholesterol level 07/29/2019  . Healthcare maintenance 07/29/2018  . Gout 07/29/2018  . Benign skin lesion of thoracic region 07/29/2018  . Obstructive sleep apnea treated with bilevel positive airway pressure (BiPAP) 05/01/2017  . Hypersomnia, persistent 11/24/2013  .  Sebaceous cyst 07/29/2013  . Insomnia, persistent 07/22/2013  . Abnormal chest percussion 07/22/2013  . Memory change 04/19/2013  . Subjective vision disturbance 04/19/2013  . Other symptoms involving digestive system(787.99) 11/13/2011  . Loss of weight 11/13/2011  . Anal or rectal pain 11/13/2011  . Personal history of colonic polyps 11/13/2011  . HSV 02/16/2010  . DRY EYE SYNDROME 02/16/2010  . ESOPHAGEAL STRICTURE 02/16/2010  . Gastroesophageal reflux disease 02/16/2010    Follow-Up:  Pharmacist Review   Reviewed chart for medication changes ahead of medication coordination call.  OVs, Consults, or hospital visits since last care coordination call/Pharmacist visit.  08/08/20 PCP Abelino Derrick   No medication changes indicated.  BP Readings from Last 3 Encounters:  08/31/20 135/75  08/08/20 124/70  02/07/20 138/72    No results found for: HGBA1C   Patient obtains medications through Adherence Packaging  30 Days   Last adherence delivery included: (medication name and frequency)  Aspirin 81 mg Daily - bedtime Vitamin D3 50 MCG Daily- Breakfast Donepezil 10 mg tablet Daily -Bedtime Finasteride 5 mg tablet Daily-Bedtime Mirtazapine 45 mg tablet Daily-Bedtime Pantoprazole 40 mg tablet Daily- Breakfast             Multivitamin Capsule Daily-Breakfast Patient declined (meds) last month due to PRN use/additional supply on hand.  Restasis 0.05% two times Daily- Adequate supply             Nexium 40 mg Capsule-No longer taking Probiotic -No longer taking Suvorexant 10 mg  tablet-No longer taking Valacyclovir 1000 mg tablet- PRN Adequate supply  Patient is due for next adherence delivery on: 09/14/2020. Called patient and reviewed medications and coordinated delivery.  This delivery to include:  Aspirin 81 mg Daily - bedtime Vitamin D3 50 MCG Daily-  Breakfast Donepezil 10 mg tablet Daily -Bedtime Finasteride 5 mg tablet Daily-Bedtime Mirtazapine 45 mg tablet Daily-Bedtime Pantoprazole 40 mg tablet Daily- Breakfast             Multivitamin Capsule Daily-Breakfast   Patient declined the following medications (meds) due to (reason)  Restasis 0.05% two times Daily- Adequate supply  Valacyclovir 1000 mg tablet- PRN Adequate supply  Patient needs refills for Finasteride 5 mg tablet.  Confirmed delivery date of 09/14/2020, advised patient that pharmacy will contact them the morning of delivery.  [place] Pharmacist Assistant [phone]

## 2020-09-22 ENCOUNTER — Encounter: Payer: Self-pay | Admitting: Gastroenterology

## 2020-09-22 ENCOUNTER — Ambulatory Visit: Payer: PPO | Admitting: Gastroenterology

## 2020-09-22 VITALS — BP 122/84 | HR 79 | Ht 66.0 in | Wt 147.8 lb

## 2020-09-22 DIAGNOSIS — K219 Gastro-esophageal reflux disease without esophagitis: Secondary | ICD-10-CM | POA: Diagnosis not present

## 2020-09-22 MED ORDER — PANTOPRAZOLE SODIUM 40 MG PO TBEC: 40.0000 mg | DELAYED_RELEASE_TABLET | Freq: Every day | ORAL | 3 refills | Status: DC

## 2020-09-22 NOTE — Patient Instructions (Signed)
We have sent the following medications to your pharmacy for you to pick up at your convenience: Protonix.   Normal BMI (Body Mass Index- based on height and weight) is between 23 and 30. Your BMI today is Body mass index is 23.86 kg/m. Marland Kitchen Please consider follow up  regarding your BMI with your Primary Care Provider.  Thank you for choosing me and [facility].  Pricilla Riffle. Dagoberto Ligas., MD., Marval Regal

## 2020-09-22 NOTE — Progress Notes (Signed)
° ° °  History of Present Illness: This is an 85 year old male with a history of GERD and esophageal stricture.  EGD findings below.  States his reflux has been well controlled.  He occasionally notes certain foods are slightly difficult to swallow but with careful chewing and avoiding certain foods he is no longer having difficulties.   EGD 11/2018 - Benign-appearing esophageal stenosis, 14 mm. Dilated to 17 mm. - Ectopic gastric mucosa in the proximal esophagus. Biopsied. - Non-bleeding erosive gastropathy. Biopsied. - Pancreatic rest in antrum. - Small hiatal hernia. - Normal duodenal bulb and second portion of the duodenum.   Current Medications, Allergies, Past Medical History, Past Surgical History, Family History and Social History were reviewed in [employer] record.   Physical Exam: General: Well developed, well nourished, no acute distress Head: Normocephalic and atraumatic Eyes:  sclerae anicteric, EOMI Ears: Normal auditory acuity Mouth: Not examined, mask on during Covid-19 pandemic Lungs: Clear throughout to auscultation Heart: Regular rate and rhythm; no murmurs, rubs or bruits Abdomen: Soft, non tender and non distended. No masses, hepatosplenomegaly or hernias noted. Normal Bowel sounds Rectal: Not done Musculoskeletal: Symmetrical with no gross deformities  Pulses:  Normal pulses noted Extremities: No clubbing, cyanosis, edema or deformities noted Neurological: Alert oriented x 4, grossly nonfocal Psychological:  Alert and cooperative. Normal mood and affect   Assessment and Recommendations:  1. GERD with LPR and a history of an esophageal stricture.  Currently minimal dysphagia symptoms that he avoids with careful chewing and avoiding certain foods.  I offered repeat EGD with dilation however he does not feel that he needs a dilation at this point.  If dysphagia symptoms worsen he will reconsider.  Follow antireflux measures and continue  pantoprazole 40 mg daily. REV in 1 year.   2. Personal history of colon adenocarcinoma in situ in 2005.  Last colonoscopy in 2013 by Dr. Sharlett Iles and showed only diverticulosis.  Due to age no plans for future surveillance colonoscopies.

## 2020-10-06 ENCOUNTER — Telehealth: Payer: Self-pay

## 2020-10-06 NOTE — Progress Notes (Signed)
Chronic Care Management Pharmacy Assistant   Name: JOACHIM CARTON III  MRN: [id number] DOB: 03-Dec-1934  Reason for Encounter: Medication Review  PCP : Libby Maw, MD  Allergies:   Allergies  Allergen Reactions  . Sulfamethoxazole Other (See Comments)    Constipation  . Sulfamethoxazole-Trimethoprim     constipation    Medications: Outpatient Encounter Medications as of 10/06/2020  Medication Sig  . aspirin 81 MG tablet Take 81 mg by mouth daily.    . Cholecalciferol (VITAMIN D3) 50 MCG (2000 UT) TABS Take 2,000 Units by mouth daily.  . cycloSPORINE (RESTASIS) 0.05 % ophthalmic emulsion 1 drop 2 (two) times daily.   Marland Kitchen donepezil (ARICEPT) 10 MG tablet Take 1 tablet (10 mg total) by mouth at bedtime.  . finasteride (PROSCAR) 5 MG tablet Take 5 mg by mouth daily.  . mirtazapine (REMERON) 45 MG tablet Take 1 tablet (45 mg total) by mouth at bedtime.  . Multiple Vitamin (MULTIVITAMIN) capsule Take 1 capsule by mouth daily.  . pantoprazole (PROTONIX) 40 MG tablet Take 1 tablet (40 mg total) by mouth daily.  . valACYclovir (VALTREX) 500 MG tablet Take 500 mg by mouth as needed.    No facility-administered encounter medications on file as of 10/06/2020.    Current Diagnosis: Patient Active Problem List   Diagnosis Date Noted  . Prostate cancer ([place]) 08/31/2020  . Vitiligo 08/08/2020  . History of gout 08/08/2020  . Dysthymia 02/07/2020  . OSA treated with BiPAP 01/19/2020  . Need for influenza vaccination 08/25/2019  . Elevated LDL cholesterol level 07/29/2019  . Healthcare maintenance 07/29/2018  . Gout 07/29/2018  . Benign skin lesion of thoracic region 07/29/2018  . Obstructive sleep apnea treated with bilevel positive airway pressure (BiPAP) 05/01/2017  . Hypersomnia, persistent 11/24/2013  . Sebaceous cyst 07/29/2013  . Insomnia, persistent 07/22/2013  . Abnormal chest percussion 07/22/2013  . Memory change 04/19/2013  . Subjective vision disturbance  04/19/2013  . Other symptoms involving digestive system(787.99) 11/13/2011  . Loss of weight 11/13/2011  . Anal or rectal pain 11/13/2011  . Personal history of colonic polyps 11/13/2011  . HSV 02/16/2010  . DRY EYE SYNDROME 02/16/2010  . ESOPHAGEAL STRICTURE 02/16/2010  . Gastroesophageal reflux disease 02/16/2010    Follow-Up:  Pharmacist Review   Reviewed chart for medication changes ahead of medication coordination call.  OVs, Consults, or hospital visits since last care coordination call/Pharmacist visit.   09/22/2020 Gastroenterology Lucio Edward  No medication changes indicated   BP Readings from Last 3 Encounters:  09/22/20 122/84  08/31/20 135/75  08/08/20 124/70    No results found for: HGBA1C   Patient obtains medications through Adherence Packaging  30 Days   Last adherence delivery included: (medication name and frequency)   Aspirin 81 mg Daily - bedtime Vitamin D3 50 MCG Daily- Breakfast Donepezil 10 mg tablet Daily -Bedtime Finasteride 5 mg tablet Daily-Bedtime Mirtazapine 45 mg tablet Daily-Bedtime Pantoprazole 40 mg tablet Daily- Breakfast Multivitamin Capsule Daily-Breakfast Patient declined (meds) last month due to PRN use/additional supply on hand.  Restasis 0.05% two times Daily- Adequate supply             Valacyclovir 1000 mg tablet- PRN Adequate supply  Patient is due for next adherence delivery on: 10/13/2020. Called patient and reviewed medications and coordinated delivery.  This delivery to include:   Aspirin 81 mg Daily - bedtime Vitamin D3 50 MCG Daily- Breakfast Donepezil 10 mg tablet Daily -Bedtime Finasteride 5 mg tablet Daily-Bedtime  Mirtazapine 45 mg tablet Daily-Bedtime Pantoprazole 40 mg tablet Daily- Breakfast Multivitamin Capsule Daily-Breakfast  Restasis 0.05% two times  Daily  Patient declined the following medications (meds) due to (reason)     Valacyclovir 1000 mg tablet- PRN Adequate supply Patient needs refills for Finasteride 5 mg tablet. Reached out to Urology to request refill on 10/06/2020.  Confirmed delivery date of 10/13/2020, advised patient that pharmacy will contact them the morning of delivery.  Patient states his blood pressure has been ranging around 120's/75's.  [place] Pharmacist Assistant [phone]

## 2020-10-19 ENCOUNTER — Telehealth: Payer: PPO

## 2020-10-30 ENCOUNTER — Telehealth: Payer: PPO

## 2020-10-30 NOTE — Chronic Care Management (AMB) (Deleted)
Chronic Care Management Pharmacy  Name: [name]  MRN: [id number] DOB: 28-Oct-1935  Chief Complaint/ HPI  [name],  85 y.o. , male presents for their Follow-Up CCM visit with the clinical pharmacist via telephone.  PCP : Libby Maw, MD  Their chronic conditions include: GERD, Hyperlipidemia, Memory changes, Gout,   Office Visits: 08/08/20: Patient presented to Dr. Ethelene Hal for follow-up. Valacyclovir decreased to 500 mg PRN. Esomeprazole 40 mg, Suvorexant stopped (pt not taking)  02/07/20: Patient presented to Dr. Ethelene Hal for insomnia follow-up. Patient started on Suvorexant 10 mg QHS PRN  02/02/20: Patient presented to Naaman Plummer, RN for AWV.   Consult Visit: 09/22/20: Patient presented to Dr. Fuller Plan (GI) for follow-up. Probiotic stopped.  01/19/20: patient presented to Dr. [name] Fairy (Neurology) for OSA follow-up. Patient sleeping 3-4 hours. BiPAP settings adjusted, no medication changes made.  12/28/19: Patient presented to Dr. Katy Fitch (ophthamology) for ocular hypertension follow-up.   Medications: Outpatient Encounter Medications as of 10/30/2020  Medication Sig  . aspirin 81 MG tablet Take 81 mg by mouth daily.    . Cholecalciferol (VITAMIN D3) 50 MCG (2000 UT) TABS Take 2,000 Units by mouth daily.  . cycloSPORINE (RESTASIS) 0.05 % ophthalmic emulsion 1 drop 2 (two) times daily.   Marland Kitchen donepezil (ARICEPT) 10 MG tablet Take 1 tablet (10 mg total) by mouth at bedtime.  . finasteride (PROSCAR) 5 MG tablet Take 5 mg by mouth daily.  . mirtazapine (REMERON) 45 MG tablet Take 1 tablet (45 mg total) by mouth at bedtime.  . Multiple Vitamin (MULTIVITAMIN) capsule Take 1 capsule by mouth daily.  . pantoprazole (PROTONIX) 40 MG tablet Take 1 tablet (40 mg total) by mouth daily.  . valACYclovir (VALTREX) 500 MG tablet Take 500 mg by mouth as needed.    No facility-administered encounter medications on file as of 10/30/2020.     Current  Diagnosis/Assessment:  Goals Addressed   None    Hyperlipidemia   History of TIA in 2010  Lipid Panel     Component Value Date/Time   CHOL 174 07/29/2019 1004   CHOL 194 07/28/2017 1504   TRIG 77.0 07/29/2019 1004   HDL 48.10 07/29/2019 1004   HDL 54 07/28/2017 1504   LDLCALC 111 (H) 07/29/2019 1004   LDLCALC 120 (H) 07/28/2017 1504   LDLDIRECT 112.0 07/29/2019 1004    CMP Latest Ref Rng & Units 08/14/2020 02/07/2020 07/29/2019  Glucose 70 - 99 mg/dL 97 99 89  BUN 6 - 23 mg/dL 22 18 16   Creatinine 0.40 - 1.50 mg/dL 0.93 1.16 0.81  Sodium 135 - 145 mEq/L 143 142 144  Potassium 3.5 - 5.1 mEq/L 4.9 4.9 4.8  Chloride 96 - 112 mEq/L 104 106 105  CO2 19 - 32 mEq/L 32 31 30  Calcium 8.4 - 10.5 mg/dL 10.6(H) 10.2 10.5  Total Protein 6.0 - 8.3 g/dL - - 6.7  Total Bilirubin 0.2 - 1.2 mg/dL - - 0.7  Alkaline Phos 39 - 117 U/L - - 81  AST 0 - 37 U/L - - 18  ALT 0 - 53 U/L - - 16   The ASCVD Risk score Mikey Bussing DC Jr., et al., 2013) failed to calculate for the following reasons:   The 2013 ASCVD risk score is only valid for ages 74 to 66   Patient has failed these meds in past: n/a Patient is currently uncontrolled on the following medications:   Aspirin 81 mg daily We discussed:  diet and exercise  extensively  Plan  Continue current medications  Mild Cognitive Impairment    Patient has failed these meds in past: n/a Patient is currently controlled on the following medications:   Donepezil 10 mg QHS   We discussed:  Patient states memory is stable, but he is still forgetful. Denies side effects from therapy.   Plan  Continue current medications  BPH   Managed by Marcine Matar  PSA  Date Value Ref Range Status  04/14/2014 9.13 (H) <=4.00 ng/mL Final    Comment:    Test Methodology: ECLIA PSA (Electrochemiluminescence Immunoassay)   For PSA values from 2.5-4.0, particularly in younger men <53 years old, the AUA and NCCN suggest testing for % Free PSA (3515)  and evaluation of the rate of increase in PSA (PSA velocity).   Patient has failed these meds in past: n/a Patient is currently controlled on the following medications:   Finasteride 5 mg daily   We discussed:  Urinary symptoms stable. Denies frequent nocturia.   Plan  Continue current medications  GERD   History of Barrett's esophagus   Patient has failed these meds in past: Esomeprazole Patient is currently controlled on the following medications:   Pantoprazole 40 mg daily   We discussed: Symptoms well controlled. Patient notes he gets severe symptoms if he skips 2-3 days of his medicine.  Plan  Continue current medications  Insomnia    Patient has failed these meds in past: melatonin  Patient is currently uncontrolled on the following medications:   Belsomra 10 mg QHS PRN   Mirtazapine 45 mg QHS We discussed: Patient never picked up Belcomra as it was too expensive. Under his insurance this is a tier 4 medicine and costs ~$90/monthly.  Patient getting 4-5 hours sleep most nights. Goes to bed watching TV, but denied difficulties falling asleep. Compliant with CPAP   Plan  Continue current medications  Pharmacist to investigate tier exception or PAP for Belsomra.   HSV   Patient has failed these meds in past: n/a Patient is currently controlled on the following medications:   Valacyclovir 500 mg daily PRN  We discussed:  Hasn't needed to use in some time.   Plan  Continue current medications   Gout   Uric Acid, Serum  Date Value Ref Range Status  08/14/2020 6.2 4.0 - 7.8 mg/dL Final  74/94/4967 7.2 4.0 - 7.8 mg/dL Final  59/16/3846 6.0 4.0 - 7.8 mg/dL Final   Patient has failed these meds in past: Allopurinol Patient is currently controlled on the following medications: None    We discussed:  Last gout flare 2-3 years ago. Drinking alcohol less (1-2x month), but does endorse eating pork most days. Counseled patient on maintaining a low purine and low  alcohol diet to prevent another gout flare.  Plan  Continue current medications  Misc/OTC    Restasis 0.5% ophth 1 drop BID  Multivitamin daily  Vitamin D3 2000 units daily  Plan  Continue current medications  Vaccines   Reviewed and discussed patient's vaccination history.    Immunization History  Administered Date(s) Administered  . Fluad Quad(high Dose 65+) 07/29/2019, 08/14/2020  . Influenza, Seasonal, Injecte, Preservative Fre 10/14/2012  . Influenza,inj,Quad PF,6+ Mos 09/15/2015, 07/24/2016, 07/28/2017  . Influenza-Unspecified 10/04/2013, 07/29/2018  . PFIZER SARS-COV-2 Vaccination 11/29/2019, 12/20/2019  . Pneumococcal Conjugate-13 04/18/2015  . Pneumococcal Polysaccharide-23 04/06/2013  . Td 02/03/2008  . Zoster Recombinat (Shingrix) 06/10/2018    Plan  Recommended patient receive Covid-19 vaccine.  Medication Management  Pt uses [facility] pharmacy for all medications Uses pill box? Yes Pt endorses 100% compliance, but also states he has multiple bottles of finasteride that have stockpiled.   Plan  Utilize [facility] for medication synchronization, packaging and delivery.   Verbal consent obtained for [facility] enhanced pharmacy services (medication synchronization, adherence packaging, delivery coordination). A medication sync plan was created to allow patient to get all medications delivered once every 30 to 90 days per patient preference. Patient understands they have freedom to choose pharmacy and clinical pharmacist will coordinate care between all prescribers and [facility].  Follow up: 6 month phone visit  [name] Clinical Pharmacist [facility] Gub[facility] at [facility]  [phone]

## 2020-10-31 ENCOUNTER — Telehealth: Payer: PPO

## 2020-10-31 NOTE — Chronic Care Management (AMB) (Deleted)
Chronic Care Management Pharmacy  Name: [name]  MRN: [id number] DOB: [birth year]  Chief Complaint/ HPI  [name],  85 y.o. , male presents for their Follow-Up CCM visit with the clinical pharmacist via telephone.  PCP : Mliss Sax, MD  Their chronic conditions include: GERD, Hyperlipidemia, Memory changes, Gout,   Office Visits: 08/08/20: Patient presented to Dr. Doreene Burke for follow-up. Valacyclovir decreased to 500 mg PRN. Esomeprazole 40 mg, Suvorexant stopped (pt not taking)  02/07/20: Patient presented to Dr. Doreene Burke for insomnia follow-up. Patient started on Suvorexant 10 mg QHS PRN  02/02/20: Patient presented to Mady Haagensen, RN for AWV.   Consult Visit: 09/22/20: Patient presented to Dr. Russella Dar (GI) for follow-up. Probiotic stopped.  01/19/20: patient presented to Dr. Vickey Huger (Neurology) for OSA follow-up. Patient sleeping 3-4 hours. BiPAP settings adjusted, no medication changes made.  12/28/19: Patient presented to Dr. Dione Booze (ophthamology) for ocular hypertension follow-up.   Medications: Outpatient Encounter Medications as of 10/31/2020  Medication Sig  . aspirin 81 MG tablet Take 81 mg by mouth daily.    . Cholecalciferol (VITAMIN D3) 50 MCG (2000 UT) TABS Take 2,000 Units by mouth daily.  . cycloSPORINE (RESTASIS) 0.05 % ophthalmic emulsion 1 drop 2 (two) times daily.   Marland Kitchen donepezil (ARICEPT) 10 MG tablet Take 1 tablet (10 mg total) by mouth at bedtime.  . finasteride (PROSCAR) 5 MG tablet Take 5 mg by mouth daily.  . mirtazapine (REMERON) 45 MG tablet Take 1 tablet (45 mg total) by mouth at bedtime.  . Multiple Vitamin (MULTIVITAMIN) capsule Take 1 capsule by mouth daily.  . pantoprazole (PROTONIX) 40 MG tablet Take 1 tablet (40 mg total) by mouth daily.  . valACYclovir (VALTREX) 500 MG tablet Take 500 mg by mouth as needed.    No facility-administered encounter medications on file as of 10/31/2020.     Current  Diagnosis/Assessment:  Goals Addressed   None    Hyperlipidemia   History of TIA in 2010  Lipid Panel     Component Value Date/Time   CHOL 174 07/29/2019 1004   CHOL 194 07/28/2017 1504   TRIG 77.0 07/29/2019 1004   HDL 48.10 07/29/2019 1004   HDL 54 07/28/2017 1504   LDLCALC 111 (H) 07/29/2019 1004   LDLCALC 120 (H) 07/28/2017 1504   LDLDIRECT 112.0 07/29/2019 1004    CMP Latest Ref Rng & Units 08/14/2020 02/07/2020 07/29/2019  Glucose 70 - 99 mg/dL 97 99 89  BUN 6 - 23 mg/dL 22 18 16   Creatinine 0.40 - 1.50 mg/dL 6.33 3.54  Sodium 135 - 145 mEq/L 143 142 144  Potassium 3.5 - 5.1 mEq/L 4.9 4.9 4.8  Chloride 96 - 112 mEq/L 104 106 105  CO2 19 - 32 mEq/L 32 31 30  Calcium 8.4 - 10.5 mg/dL 10.6(H) 10.2 10.5  Total Protein 6.0 - 8.3 g/dL - - 6.7  Total Bilirubin 0.2 - 1.2 mg/dL - - 0.7  Alkaline Phos 39 - 117 U/L - - 81  AST 0 - 37 U/L - - 18  ALT 0 - 53 U/L - - 16   The ASCVD Risk score 5.62 DC Jr., et al., 2013) failed to calculate for the following reasons:   The 2013 ASCVD risk score is only valid for ages 69 to 25   Patient has failed these meds in past: n/a Patient is currently uncontrolled on the following medications:   Aspirin 81 mg daily We discussed:  diet and exercise  extensively  Plan  Continue current medications  Mild Cognitive Impairment    Patient has failed these meds in past: n/a Patient is currently controlled on the following medications:   Donepezil 10 mg QHS   We discussed:  Patient states memory is stable, but he is still forgetful. Denies side effects from therapy.   Plan  Continue current medications  BPH   Managed by Marcine Matar  PSA  Date Value Ref Range Status  04/14/2014 9.13 (H) <=4.00 ng/mL Final    Comment:    Test Methodology: ECLIA PSA (Electrochemiluminescence Immunoassay)   For PSA values from 2.5-4.0, particularly in younger men <22 years old, the AUA and NCCN suggest testing for % Free PSA (3515)  and evaluation of the rate of increase in PSA (PSA velocity).   Patient has failed these meds in past: n/a Patient is currently controlled on the following medications:   Finasteride 5 mg daily   We discussed:  Urinary symptoms stable. Denies frequent nocturia.   Plan  Continue current medications  GERD   History of Barrett's esophagus   Patient has failed these meds in past: Esomeprazole Patient is currently controlled on the following medications:   Pantoprazole 40 mg daily   We discussed: Symptoms well controlled. Patient notes he gets severe symptoms if he skips 2-3 days of his medicine.  Plan  Continue current medications  Insomnia    Patient has failed these meds in past: melatonin  Patient is currently uncontrolled on the following medications:   Belsomra 10 mg QHS PRN   Mirtazapine 45 mg QHS We discussed: Patient never picked up Belcomra as it was too expensive. Under his insurance this is a tier 4 medicine and costs ~$90/monthly.  Patient getting 4-5 hours sleep most nights. Goes to bed watching TV, but denied difficulties falling asleep. Compliant with CPAP   Plan  Continue current medications  Pharmacist to investigate tier exception or PAP for Belsomra.   HSV   Patient has failed these meds in past: n/a Patient is currently controlled on the following medications:   Valacyclovir 500 mg daily PRN  We discussed:  Hasn't needed to use in some time.   Plan  Continue current medications   Gout   Uric Acid, Serum  Date Value Ref Range Status  08/14/2020 6.2 4.0 - 7.8 mg/dL Final  75/88/3254 7.2 4.0 - 7.8 mg/dL Final  98/26/4158 6.0 4.0 - 7.8 mg/dL Final   Patient has failed these meds in past: Allopurinol Patient is currently controlled on the following medications: None    We discussed:  Last gout flare 2-3 years ago. Drinking alcohol less (1-2x month), but does endorse eating pork most days. Counseled patient on maintaining a low purine and low  alcohol diet to prevent another gout flare.  Plan  Continue current medications  Misc/OTC    Restasis 0.5% ophth 1 drop BID  Multivitamin daily  Vitamin D3 2000 units daily  Plan  Continue current medications  Vaccines   Reviewed and discussed patient's vaccination history.    Immunization History  Administered Date(s) Administered  . Fluad Quad(high Dose 65+) 07/29/2019, 08/14/2020  . Influenza, Seasonal, Injecte, Preservative Fre 10/14/2012  . Influenza,inj,Quad PF,6+ Mos 09/15/2015, 07/24/2016, 07/28/2017  . Influenza-Unspecified 10/04/2013, 07/29/2018  . PFIZER SARS-COV-2 Vaccination 11/29/2019, 12/20/2019  . Pneumococcal Conjugate-13 04/18/2015  . Pneumococcal Polysaccharide-23 04/06/2013  . Td 02/03/2008  . Zoster Recombinat (Shingrix) 06/10/2018    Plan  Recommended patient receive Covid-19 vaccine.  Medication Management  Pt uses [facility] pharmacy for all medications Uses pill box? Yes Pt endorses 100% compliance, but also states he has multiple bottles of finasteride that have stockpiled.   Plan  Utilize [facility] for medication synchronization, packaging and delivery.   Verbal consent obtained for [facility] enhanced pharmacy services (medication synchronization, adherence packaging, delivery coordination). A medication sync plan was created to allow patient to get all medications delivered once every 30 to 90 days per patient preference. Patient understands they have freedom to choose pharmacy and clinical pharmacist will coordinate care between all prescribers and [facility].  Follow up: 6 month phone visit  [name] Clinical Pharmacist Corinda Gubler at [facility]  [phone]

## 2020-11-01 ENCOUNTER — Telehealth: Payer: Self-pay

## 2020-11-01 NOTE — Progress Notes (Addendum)
Chronic Care Management Pharmacy Assistant   Name: [name]  MRN: [id number] DOB: 1935/01/17  Reason for Encounter: Medication Review  PCP : David Sax, MD  Allergies:   Allergies  Allergen Reactions   Sulfamethoxazole Other (See Comments)    Constipation   Sulfamethoxazole-Trimethoprim     constipation    Medications: Outpatient Encounter Medications as of 11/01/2020  Medication Sig   aspirin 81 MG tablet Take 81 mg by mouth daily.     Cholecalciferol (VITAMIN D3) 50 MCG (2000 UT) TABS Take 2,000 Units by mouth daily.   cycloSPORINE (RESTASIS) 0.05 % ophthalmic emulsion 1 drop 2 (two) times daily.    donepezil (ARICEPT) 10 MG tablet Take 1 tablet (10 mg total) by mouth at bedtime.   finasteride (PROSCAR) 5 MG tablet Take 5 mg by mouth daily.   mirtazapine (REMERON) 45 MG tablet Take 1 tablet (45 mg total) by mouth at bedtime.   Multiple Vitamin (MULTIVITAMIN) capsule Take 1 capsule by mouth daily.   pantoprazole (PROTONIX) 40 MG tablet Take 1 tablet (40 mg total) by mouth daily.   valACYclovir (VALTREX) 500 MG tablet Take 500 mg by mouth as needed.    No facility-administered encounter medications on file as of 11/01/2020.    Current Diagnosis: Patient Active Problem List   Diagnosis Date Noted   Prostate cancer (HCC) 08/31/2020   Vitiligo 08/08/2020   History of gout 08/08/2020   Dysthymia 02/07/2020   OSA treated with BiPAP 01/19/2020   Need for influenza vaccination 08/25/2019   Elevated LDL cholesterol level 07/29/2019   Healthcare maintenance 07/29/2018   Gout 07/29/2018   Benign skin lesion of thoracic region 07/29/2018   Obstructive sleep apnea treated with bilevel positive airway pressure (BiPAP) 05/01/2017   Hypersomnia, persistent 11/24/2013   Sebaceous cyst 07/29/2013   Insomnia, persistent 07/22/2013   Abnormal chest percussion 07/22/2013   Memory change 04/19/2013   Subjective vision disturbance 04/19/2013   Other symptoms  involving digestive system(787.99) 11/13/2011   Loss of weight 11/13/2011   Anal or rectal pain 11/13/2011   Personal history of colonic polyps 11/13/2011   HSV 02/16/2010   DRY EYE SYNDROME 02/16/2010   ESOPHAGEAL STRICTURE 02/16/2010   Gastroesophageal reflux disease 02/16/2010    Goals Addressed   None    Reviewed chart for medication changes ahead of medication coordination call.  No OVs, Consults, or hospital visits since last care coordination call/Pharmacist visit.   No medication changes indicated.  BP Readings from Last 3 Encounters:  09/22/20 122/84  08/31/20 135/75  08/08/20 124/70    No results found for: HGBA1C   Patient obtains medications through Adherence Packaging  30 Days   Last adherence delivery included:  Aspirin 81 mg Daily - bedtime             Vitamin D3 50 MCG Daily- Breakfast             Donepezil 10 mg tablet Daily -Bedtime             Finasteride 5 mg tablet Daily-Bedtime             Mirtazapine 45 mg tablet Daily-Bedtime             Pantoprazole 40 mg tablet Daily- Breakfast             Multivitamin Capsule Daily-Breakfast             Restasis 0.05% two times Daily  Patient declined medication last month  due to PRN use/additional supply on hand.       Valacyclovir 1000 mg tablet- PRN Adequate supply  Patient is due for next adherence delivery on: 11/10/2020. Called patient and reviewed medications and coordinated delivery.  This delivery to include:  Aspirin 81 mg Daily - bedtime             Vitamin D3 50 MCG Daily- Breakfast             Donepezil 10 mg tablet Daily -Bedtime             Finasteride 5 mg tablet Daily-Bedtime             Mirtazapine 45 mg tablet Daily-Bedtime             Pantoprazole 40 mg tablet Daily- Breakfast             Multivitamin Capsule Daily-Breakfast  Latanoprost 0.005% Eye Drops -one drop in each eye at bedtime  Patient will not  need a short fill of medication, prior to adherence delivery.  Patient will  not need acute fill of medication, prior to adherence delivery.   Patient declined the following medications medication due to     Valacyclovir 1000 mg tablet- PRN Adequate supply             Restasis 0.05% two times Daily-Adequate supply  Patient needs refills for None ID.  Confirmed delivery date of 11/10/2020, advised patient that pharmacy will contact them the morning of delivery.  Blood pressure readings: Patient states his blood pressure ranges around 120/75.  Follow-Up:  Pharmacist Review   [name] Clinical Pharmacist Assistant [phone]   Patient scheduled for CPP follow-up on 11/22/19 at 3:00 PM 12 minutes spent in review, coordination, and documentation.  [name] Clinical Pharmacist [facility] at [facility]  [phone]

## 2020-11-20 ENCOUNTER — Telehealth: Payer: Self-pay

## 2020-11-20 NOTE — Progress Notes (Signed)
Spoke to patient to confirmed patient telephone appointment on 11/21/2020 for CCM at 3:00 pm with Junius Argyle the Clinical pharmacist.    Patient Verbalized understanding.  [place] Pharmacist Assistant [phone]

## 2020-11-21 ENCOUNTER — Telehealth: Payer: PPO

## 2020-11-21 NOTE — Chronic Care Management (AMB) (Deleted)
Chronic Care Management Pharmacy  Name: [name]  MRN: [id number] DOB: 1935/04/06  Chief Complaint/ HPI  [name],  86 y.o. , male presents for their Follow-Up CCM visit with the clinical pharmacist via telephone.  PCP : Libby Maw, MD  Their chronic conditions include: GERD, Hyperlipidemia, Memory changes, Gout,   Office Visits: 08/08/20: Patient presented to Dr. Ethelene Hal for follow-up. Valacyclovir decreased to 500 mg PRN. Esomeprazole 40 mg, Suvorexant stopped (pt not taking)  02/07/20: Patient presented to Dr. Ethelene Hal for insomnia follow-up. Patient started on Suvorexant 10 mg QHS PRN  02/02/20: Patient presented to Naaman Plummer, RN for AWV.   Consult Visit: 09/22/20: Patient presented to Dr. Fuller Plan (GI) for follow-up. Probiotic stopped.  01/19/20: patient presented to Dr. [name] Fairy (Neurology) for OSA follow-up. Patient sleeping 3-4 hours. BiPAP settings adjusted, no medication changes made.  12/28/19: Patient presented to Dr. Katy Fitch (ophthamology) for ocular hypertension follow-up.   Medications: Outpatient Encounter Medications as of 11/21/2020  Medication Sig  . aspirin 81 MG tablet Take 81 mg by mouth daily.    . Cholecalciferol (VITAMIN D3) 50 MCG (2000 UT) TABS Take 2,000 Units by mouth daily.  . cycloSPORINE (RESTASIS) 0.05 % ophthalmic emulsion 1 drop 2 (two) times daily.   Marland Kitchen donepezil (ARICEPT) 10 MG tablet Take 1 tablet (10 mg total) by mouth at bedtime.  . finasteride (PROSCAR) 5 MG tablet Take 5 mg by mouth daily.  Marland Kitchen latanoprost (XALATAN) 0.005 % ophthalmic solution 1 drop at bedtime.  . mirtazapine (REMERON) 45 MG tablet Take 1 tablet (45 mg total) by mouth at bedtime.  . Multiple Vitamin (MULTIVITAMIN) capsule Take 1 capsule by mouth daily.  . pantoprazole (PROTONIX) 40 MG tablet Take 1 tablet (40 mg total) by mouth daily.  . valACYclovir (VALTREX) 500 MG tablet Take 500 mg by mouth as needed.    No facility-administered encounter  medications on file as of 11/21/2020.     Current Diagnosis/Assessment:  Goals Addressed   None    Hyperlipidemia   History of TIA in 2010  Lipid Panel     Component Value Date/Time   CHOL 174 07/29/2019 1004   CHOL 194 07/28/2017 1504   TRIG 77.0 07/29/2019 1004   HDL 48.10 07/29/2019 1004   HDL 54 07/28/2017 1504   LDLCALC 111 (H) 07/29/2019 1004   LDLCALC 120 (H) 07/28/2017 1504   LDLDIRECT 112.0 07/29/2019 1004    CMP Latest Ref Rng & Units 08/14/2020 02/07/2020 07/29/2019  Glucose 70 - 99 mg/dL 97 99 89  BUN 6 - 23 mg/dL 22 18 16   Creatinine 0.40 - 1.50 mg/dL 0.93 1.16 0.81  Sodium 135 - 145 mEq/L 143 142 144  Potassium 3.5 - 5.1 mEq/L 4.9 4.9 4.8  Chloride 96 - 112 mEq/L 104 106 105  CO2 19 - 32 mEq/L 32 31 30  Calcium 8.4 - 10.5 mg/dL 10.6(H) 10.2 10.5  Total Protein 6.0 - 8.3 g/dL - - 6.7  Total Bilirubin 0.2 - 1.2 mg/dL - - 0.7  Alkaline Phos 39 - 117 U/L - - 81  AST 0 - 37 U/L - - 18  ALT 0 - 53 U/L - - 16   The ASCVD Risk score Mikey Bussing DC Jr., et al., 2013) failed to calculate for the following reasons:   The 2013 ASCVD risk score is only valid for ages 31 to 24   Patient has failed these meds in past: n/a Patient is currently uncontrolled on the following medications:  Aspirin 81 mg daily We discussed:  diet and exercise extensively  Plan  Continue current medications  Mild Cognitive Impairment    Patient has failed these meds in past: n/a Patient is currently controlled on the following medications:   Donepezil 10 mg QHS   We discussed:  Patient states memory is stable, but he is still forgetful. Denies side effects from therapy.   Plan  Continue current medications  BPH   Managed by Franchot Gallo  PSA  Date Value Ref Range Status  04/14/2014 9.13 (H) <=4.00 ng/mL Final    Comment:    Test Methodology: ECLIA PSA (Electrochemiluminescence Immunoassay)   For PSA values from 2.5-4.0, particularly in younger men <14 years old, the  AUA and NCCN suggest testing for % Free PSA (3515) and evaluation of the rate of increase in PSA (PSA velocity).   Patient has failed these meds in past: n/a Patient is currently controlled on the following medications:   Finasteride 5 mg daily   We discussed:  Urinary symptoms stable. Denies frequent nocturia.   Plan  Continue current medications  GERD   History of Barrett's esophagus   Patient has failed these meds in past: Esomeprazole Patient is currently controlled on the following medications:   Pantoprazole 40 mg daily   We discussed: Symptoms well controlled. Patient notes he gets severe symptoms if he skips 2-3 days of his medicine.  Plan  Continue current medications  Insomnia    Patient has failed these meds in past: melatonin  Patient is currently uncontrolled on the following medications:   Belsomra 10 mg QHS PRN   Mirtazapine 45 mg QHS We discussed: Patient never picked up [place] as it was too expensive. Under his insurance this is a tier 4 medicine and costs ~$90/monthly.  Patient getting 4-5 hours sleep most nights. Goes to bed watching TV, but denied difficulties falling asleep. Compliant with CPAP   Plan  Continue current medications  Pharmacist to investigate tier exception or PAP for Belsomra.   HSV   Patient has failed these meds in past: n/a Patient is currently controlled on the following medications:   Valacyclovir 500 mg daily PRN  We discussed:  Hasn't needed to use in some time.   Plan  Continue current medications   Gout   Uric Acid, Serum  Date Value Ref Range Status  08/14/2020 6.2 4.0 - 7.8 mg/dL Final  02/07/2020 7.2 4.0 - 7.8 mg/dL Final  07/29/2019 6.0 4.0 - 7.8 mg/dL Final   Patient has failed these meds in past: Allopurinol Patient is currently controlled on the following medications: None    We discussed:  Last gout flare 2-3 years ago. Drinking alcohol less (1-2x month), but does endorse eating pork most days.  Counseled patient on maintaining a low purine and low alcohol diet to prevent another gout flare.  Plan  Continue current medications  Misc/OTC    Restasis 0.5% ophth 1 drop BID  Multivitamin daily  Vitamin D3 2000 units daily  Plan  Continue current medications  Vaccines   Reviewed and discussed patient's vaccination history.    Immunization History  Administered Date(s) Administered  . Fluad Quad(high Dose 65+) 07/29/2019, 08/14/2020  . Influenza, Seasonal, Injecte, Preservative Fre 10/14/2012  . Influenza,inj,Quad PF,6+ Mos 09/15/2015, 07/24/2016, 07/28/2017  . Influenza-Unspecified 10/04/2013, 07/29/2018  . PFIZER(Purple Top)SARS-COV-2 Vaccination 11/29/2019, 12/20/2019  . Pneumococcal Conjugate-13 04/18/2015  . Pneumococcal Polysaccharide-23 04/06/2013  . Td 02/03/2008  . Zoster Recombinat (Shingrix) 06/10/2018    Plan  Recommended patient receive Covid-19 vaccine.  Medication Management   Pt uses [facility] pharmacy for all medications Uses pill box? Yes Pt endorses 100% compliance, but also states he has multiple bottles of finasteride that have stockpiled.   Plan  Utilize [facility] for medication synchronization, packaging and delivery.   Verbal consent obtained for [facility] enhanced pharmacy services (medication synchronization, adherence packaging, delivery coordination). A medication sync plan was created to allow patient to get all medications delivered once every 30 to 90 days per patient preference. Patient understands they have freedom to choose pharmacy and clinical pharmacist will coordinate care between all prescribers and [facility].  Follow up: 6 month phone visit  [place] at [facility]  [phone]

## 2020-11-23 ENCOUNTER — Other Ambulatory Visit: Payer: Self-pay | Admitting: Family Medicine

## 2020-11-23 DIAGNOSIS — G4700 Insomnia, unspecified: Secondary | ICD-10-CM

## 2020-11-23 DIAGNOSIS — F341 Dysthymic disorder: Secondary | ICD-10-CM

## 2020-11-23 NOTE — Telephone Encounter (Signed)
Refill request on pending medication, last OV 08/08/20. Please advise.

## 2020-12-01 ENCOUNTER — Telehealth: Payer: Self-pay

## 2020-12-01 NOTE — Progress Notes (Signed)
Chronic Care Management Pharmacy Assistant   Name: [name]  MRN: [id number] DOB: Mar 31, 1935  Reason for Encounter: Medication Review  Patient Questions:  1.  Have you seen any other providers since your last visit? No  2.  Any changes in your medicines or health? No    PCP : Libby Maw, MD  Allergies:   Allergies  Allergen Reactions  . Sulfamethoxazole Other (See Comments)    Constipation  . Sulfamethoxazole-Trimethoprim     constipation    Medications: Outpatient Encounter Medications as of 12/01/2020  Medication Sig  . aspirin 81 MG tablet Take 81 mg by mouth daily.    . Cholecalciferol (VITAMIN D3) 50 MCG (2000 UT) TABS Take 2,000 Units by mouth daily.  . cycloSPORINE (RESTASIS) 0.05 % ophthalmic emulsion 1 drop 2 (two) times daily.   Marland Kitchen donepezil (ARICEPT) 10 MG tablet Take 1 tablet (10 mg total) by mouth at bedtime.  . finasteride (PROSCAR) 5 MG tablet Take 5 mg by mouth daily.  Marland Kitchen latanoprost (XALATAN) 0.005 % ophthalmic solution 1 drop at bedtime.  . mirtazapine (REMERON) 45 MG tablet Take 1 tablet (45 mg total) by mouth at bedtime. Needs appt with pcp for additional refills  . Multiple Vitamin (MULTIVITAMIN) capsule Take 1 capsule by mouth daily.  . pantoprazole (PROTONIX) 40 MG tablet Take 1 tablet (40 mg total) by mouth daily.  . valACYclovir (VALTREX) 500 MG tablet Take 500 mg by mouth as needed.    No facility-administered encounter medications on file as of 12/01/2020.    Current Diagnosis: Patient Active Problem List   Diagnosis Date Noted  . Prostate cancer ([place]) 08/31/2020  . Vitiligo 08/08/2020  . History of gout 08/08/2020  . Dysthymia 02/07/2020  . OSA treated with BiPAP 01/19/2020  . Need for influenza vaccination 08/25/2019  . Elevated LDL cholesterol level 07/29/2019  . Healthcare maintenance 07/29/2018  . Gout 07/29/2018  . Benign skin lesion of thoracic region 07/29/2018  . Obstructive sleep apnea treated with bilevel  positive airway pressure (BiPAP) 05/01/2017  . Hypersomnia, persistent 11/24/2013  . Sebaceous cyst 07/29/2013  . Insomnia, persistent 07/22/2013  . Abnormal chest percussion 07/22/2013  . Memory change 04/19/2013  . Subjective vision disturbance 04/19/2013  . Other symptoms involving digestive system(787.99) 11/13/2011  . Loss of weight 11/13/2011  . Anal or rectal pain 11/13/2011  . Personal history of colonic polyps 11/13/2011  . HSV 02/16/2010  . DRY EYE SYNDROME 02/16/2010  . ESOPHAGEAL STRICTURE 02/16/2010  . Gastroesophageal reflux disease 02/16/2010    Goals Addressed   None    Reviewed chart for medication changes ahead of medication coordination call.  No OVs, Consults, or hospital visits since last care coordination call/Pharmacist visit. No medication changes indicated   BP Readings from Last 3 Encounters:  09/22/20 122/84  08/31/20 135/75  08/08/20 124/70    No results found for: HGBA1C   Patient obtains medications through Adherence Packaging  30 Days   Last adherence delivery included:     Aspirin 81 mg Daily - bedtime Vitamin D3 50 MCG Daily- Breakfast Donepezil 10 mg tablet Daily -Bedtime Finasteride 5 mg tablet Daily-Bedtime Mirtazapine 45 mg tablet Daily-Bedtime Pantoprazole 40 mg tablet Daily- Breakfast Multivitamin Capsule Daily-Breakfast             Latanoprost 0.005% Eye Drops -one drop in each eye at bedtime  Patient declined medication last month     Valacyclovir 1000 mg tablet- PRN Adequate supply Restasis 0.05% two times  Daily-Adequate supply  Patient is due for next adherence delivery on: 12/08/2020. Called patient and reviewed medications and coordinated delivery.  This delivery to include:   Aspirin 81 mg Daily - bedtime Vitamin D3 50 MCG Daily- Breakfast Donepezil 10 mg tablet Daily -Bedtime Finasteride 5 mg  tablet Daily-Bedtime Mirtazapine 45 mg tablet Daily-Bedtime Pantoprazole 40 mg tablet Daily- Breakfast Multivitamin Capsule Daily-Breakfast             Latanoprost 0.005% Eye Drops -one drop in each eye at bedtime  Patient will not need a short fill of medication, prior to adherence delivery. Patient will not need a acute fill of medication, prior to adherence delivery.  Patient declined the following medications:  Valacyclovir 1000 mg tablet- PRN Adequate supply Restasis 0.05% two times Daily-Adequate supply  Patient needs refills for None ID.  Confirmed delivery date of 12/08/2020, advised patient that pharmacy will contact them the morning of delivery.  Patient states his blood pressure ranging around 120/70's.  Follow-Up:  Pharmacist Review   Bessie [place] Pharmacist Assistant [phone]

## 2020-12-28 DIAGNOSIS — Z961 Presence of intraocular lens: Secondary | ICD-10-CM | POA: Diagnosis not present

## 2020-12-28 DIAGNOSIS — H40013 Open angle with borderline findings, low risk, bilateral: Secondary | ICD-10-CM | POA: Diagnosis not present

## 2020-12-28 DIAGNOSIS — H18423 Band keratopathy, bilateral: Secondary | ICD-10-CM | POA: Diagnosis not present

## 2020-12-28 DIAGNOSIS — H16223 Keratoconjunctivitis sicca, not specified as Sjogren's, bilateral: Secondary | ICD-10-CM | POA: Diagnosis not present

## 2021-01-02 ENCOUNTER — Telehealth: Payer: Self-pay

## 2021-01-02 ENCOUNTER — Other Ambulatory Visit: Payer: Self-pay

## 2021-01-02 DIAGNOSIS — G4700 Insomnia, unspecified: Secondary | ICD-10-CM

## 2021-01-02 DIAGNOSIS — F341 Dysthymic disorder: Secondary | ICD-10-CM

## 2021-01-02 NOTE — Progress Notes (Signed)
Chronic Care Management Pharmacy Assistant   Name: [name]  MRN: [id number] DOB: 1934/12/19  Reason for Encounter: Medication Review  Patient Questions:  1.  Have you seen any other providers since your last visit? No  2.  Any changes in your medicines or health? No   PCP : Libby Maw, MD  Allergies:   Allergies  Allergen Reactions  . Sulfamethoxazole Other (See Comments)    Constipation  . Sulfamethoxazole-Trimethoprim     constipation    Medications: Outpatient Encounter Medications as of 01/02/2021  Medication Sig  . aspirin 81 MG tablet Take 81 mg by mouth daily.    . Cholecalciferol (VITAMIN D3) 50 MCG (2000 UT) TABS Take 2,000 Units by mouth daily.  . cycloSPORINE (RESTASIS) 0.05 % ophthalmic emulsion 1 drop 2 (two) times daily.   Marland Kitchen donepezil (ARICEPT) 10 MG tablet Take 1 tablet (10 mg total) by mouth at bedtime.  . finasteride (PROSCAR) 5 MG tablet Take 5 mg by mouth daily.  Marland Kitchen latanoprost (XALATAN) 0.005 % ophthalmic solution 1 drop at bedtime.  . mirtazapine (REMERON) 45 MG tablet Take 1 tablet (45 mg total) by mouth at bedtime. Needs appt with pcp for additional refills  . Multiple Vitamin (MULTIVITAMIN) capsule Take 1 capsule by mouth daily.  . pantoprazole (PROTONIX) 40 MG tablet Take 1 tablet (40 mg total) by mouth daily.  . valACYclovir (VALTREX) 500 MG tablet Take 500 mg by mouth as needed.    No facility-administered encounter medications on file as of 01/02/2021.    Current Diagnosis: Patient Active Problem List   Diagnosis Date Noted  . Prostate cancer ([place]) 08/31/2020  . Vitiligo 08/08/2020  . History of gout 08/08/2020  . Dysthymia 02/07/2020  . OSA treated with BiPAP 01/19/2020  . Need for influenza vaccination 08/25/2019  . Elevated LDL cholesterol level 07/29/2019  . Healthcare maintenance 07/29/2018  . Gout 07/29/2018  . Benign skin lesion of thoracic region 07/29/2018  . Obstructive sleep apnea treated with bilevel  positive airway pressure (BiPAP) 05/01/2017  . Hypersomnia, persistent 11/24/2013  . Sebaceous cyst 07/29/2013  . Insomnia, persistent 07/22/2013  . Abnormal chest percussion 07/22/2013  . Memory change 04/19/2013  . Subjective vision disturbance 04/19/2013  . Other symptoms involving digestive system(787.99) 11/13/2011  . Loss of weight 11/13/2011  . Anal or rectal pain 11/13/2011  . Personal history of colonic polyps 11/13/2011  . HSV 02/16/2010  . DRY EYE SYNDROME 02/16/2010  . ESOPHAGEAL STRICTURE 02/16/2010  . Gastroesophageal reflux disease 02/16/2010    Goals Addressed   None    Reviewed chart for medication changes ahead of medication coordination call.  No OVs, Consults, or hospital visits since last care coordination call/Pharmacist visit. No medication changes indicated   BP Readings from Last 3 Encounters:  09/22/20 122/84  08/31/20 135/75  08/08/20 124/70    No results found for: HGBA1C   Patient obtains medications through Adherence Packaging  30 Days   Last adherence delivery included:    Aspirin 81 mg Daily - bedtime Vitamin D3 50 MCG Daily- Breakfast Donepezil 10 mg tablet Daily -Bedtime Finasteride 5 mg tablet Daily-Bedtime Mirtazapine 45 mg tablet Daily-Bedtime Pantoprazole 40 mg tablet Daily- Breakfast Multivitamin Capsule Daily-Breakfast Latanoprost 0.005% Eye Drops -one drop in each eye at bedtime   Patient declined medication last month:  Valacyclovir 1000 mg tablet- PRN Adequate supply  Restasis 0.05% two times Daily-Adequate supply  Patient is due for next adherence delivery on: 01/09/2021. Called patient and  reviewed medications and coordinated delivery.  This delivery to include:   Aspirin 81 mg Daily - bedtime Vitamin D3 50 MCG Daily- Breakfast Donepezil 10 mg tablet Daily -Bedtime Finasteride 5 mg tablet  Daily-Bedtime Mirtazapine 45 mg tablet Daily-Bedtime Pantoprazole 40 mg tablet Daily- Breakfast Multivitamin Capsule Daily-Breakfast  Patient will not need a short fill of medication, prior to adherence delivery. Patient will not need a acute fill of medication, prior to adherence delivery.  Patient declined the following medications:  Latanoprost 0.005% Eye Drops -one drop in each eye at bedtime-Adequate supply  Valacyclovir 1000 mg tablet- PRN Adequate supply  Restasis 0.05% two times Daily-Adequate supply  Patient needs refills for Mirtazapine 45 mg , reach out to PCP to request refill on 01/02/2021(Per PCP office patient needs to keep appointment in April for further refills).  Confirmed delivery date of 01/09/2021, advised patient that pharmacy will contact them the morning of delivery.  Patient states his blood pressure ranging around 120/70's Follow-Up:  Pharmacist Review   [place] Pharmacist Assistant [phone]

## 2021-01-02 NOTE — Telephone Encounter (Signed)
Bessie calling from [facility] request a refill for pt.  Last fill 11/23/20  #30/0 Last OV 08/08/20 Next OV 02/06/21

## 2021-01-03 MED ORDER — MIRTAZAPINE 45 MG PO TABS: 45.0000 mg | ORAL_TABLET | Freq: Every day | ORAL | 0 refills | Status: DC

## 2021-02-02 ENCOUNTER — Telehealth: Payer: Self-pay | Admitting: Family Medicine

## 2021-02-02 ENCOUNTER — Telehealth: Payer: Self-pay

## 2021-02-02 NOTE — Telephone Encounter (Signed)
Caller Name: [place]  MEDICATION(S): mirtazapine (REMERON) 45 MG tablet  Pt will be out in a few days  Has the patient contacted their pharmacy?yes - no refills left  Preferred Pharmacy: [facility]

## 2021-02-02 NOTE — Progress Notes (Signed)
    Chronic Care Management Pharmacy Assistant   Name: [name]  MRN: [id number] DOB: Jan 13, 1935  Reason for Encounter: Medication Review/Medication coordination call.   Recent office visits:  None ID  Recent consult visits:  None ID  Hospital visits:  None in previous 6 months  Medications: Outpatient Encounter Medications as of 02/02/2021  Medication Sig  . aspirin 81 MG tablet Take 81 mg by mouth daily.    . Cholecalciferol (VITAMIN D3) 50 MCG (2000 UT) TABS Take 2,000 Units by mouth daily.  . cycloSPORINE (RESTASIS) 0.05 % ophthalmic emulsion 1 drop 2 (two) times daily.   Marland Kitchen donepezil (ARICEPT) 10 MG tablet Take 1 tablet (10 mg total) by mouth at bedtime.  . finasteride (PROSCAR) 5 MG tablet Take 5 mg by mouth daily.  Marland Kitchen latanoprost (XALATAN) 0.005 % ophthalmic solution 1 drop at bedtime.  . mirtazapine (REMERON) 45 MG tablet Take 1 tablet (45 mg total) by mouth at bedtime. Needs appt with pcp for additional refills  . Multiple Vitamin (MULTIVITAMIN) capsule Take 1 capsule by mouth daily.  . pantoprazole (PROTONIX) 40 MG tablet Take 1 tablet (40 mg total) by mouth daily.  . valACYclovir (VALTREX) 500 MG tablet Take 500 mg by mouth as needed.    No facility-administered encounter medications on file as of 02/02/2021.    Star Rating Drugs:None ID   Reviewed chart for medication changes ahead of medication coordination call.   BP Readings from Last 3 Encounters:  09/22/20 122/84  08/31/20 135/75  08/08/20 124/70    No results found for: HGBA1C   Patient obtains medications through Adherence Packaging  30 Days   Last adherence delivery included:   Aspirin 81 mg Daily - bedtime  Vitamin D3 50 MCG Daily- Breakfast  Donepezil 10 mg tablet Daily -Bedtime  Finasteride 5 mg tablet Daily-Bedtime  Mirtazapine 45 mg tablet Daily-Bedtime  Pantoprazole 40 mg tablet Daily- Breakfast  Multivitamin Capsule Daily-Breakfast  Patient declined medications last  month:  Latanoprost 0.005% Eye Drops -one drop in each eye at bedtime-Adequate supply  Valacyclovir 1000 mg tablet- PRN Adequate supply  Restasis 0.05% two times Daily-Adequate supply  Patient is due for next adherence delivery on: 02/08/2021. Called patient and reviewed medications and coordinated delivery.  This delivery to include:  Aspirin 81 mg Daily - bedtime  Vitamin D3 50 MCG Daily- Breakfast  Donepezil 10 mg tablet Daily -Bedtime  Finasteride 5 mg tablet Daily-Bedtime  Mirtazapine 45 mg tablet Daily-Bedtime  Pantoprazole 40 mg tablet Daily- Breakfast  Multivitamin Capsule Daily-Breakfast  Patient declined the following medications  Latanoprost 0.005% Eye Drops -one drop in each eye at bedtime-Adequate supply  Valacyclovir 1000 mg tablet- PRN Adequate supply  Restasis 0.05% two times Daily-Adequate supply  Patient needs refills for Mirtazapine 45 mg , reach out to PCP to request refill on 02/02/2021(Per PCP office patient needs to keep appointment in April for further refills).  Confirmed delivery date of 02/08/2021, advised patient that pharmacy will contact them the morning of delivery.  [place] Pharmacist Assistant [phone]

## 2021-02-06 ENCOUNTER — Other Ambulatory Visit: Payer: Self-pay

## 2021-02-06 ENCOUNTER — Ambulatory Visit (INDEPENDENT_AMBULATORY_CARE_PROVIDER_SITE_OTHER): Payer: PPO

## 2021-02-06 VITALS — BP 126/70 | HR 94 | Temp 97.7°F | Resp 16 | Ht 66.0 in | Wt 146.4 lb

## 2021-02-06 DIAGNOSIS — Z0000 Encounter for general adult medical examination without abnormal findings: Secondary | ICD-10-CM | POA: Diagnosis not present

## 2021-02-06 NOTE — Progress Notes (Signed)
Subjective:   [name] is a 86 y.o. male who presents for Medicare Annual/Subsequent preventive examination.  Review of Systems     Cardiac Risk Factors include: advanced age (>66men, >46 women);male gender;dyslipidemia     Objective:    Today's Vitals   02/06/21 1328  BP: 126/70  Pulse: 94  Resp: 16  Temp: 97.7 F (36.5 C)  TempSrc: Temporal  SpO2: 96%  Weight: 146 lb 6.4 oz (66.4 kg)  Height: 5\' 6"  (1.676 m)   Body mass index is 23.63 kg/m.  Advanced Directives 02/06/2021 02/02/2020 07/28/2017 11/20/2015 05/16/2015 07/23/2013  Does Patient Have a Medical Advance Directive? No No No No No Patient does not have advance directive  Would patient like information on creating a medical advance directive? No - Patient declined No - Patient declined Yes (ED - Information included in AVS) - - -    Current Medications (verified) Outpatient Encounter Medications as of 02/06/2021  Medication Sig  . aspirin 81 MG tablet Take 81 mg by mouth daily.  . Cholecalciferol (VITAMIN D3) 50 MCG (2000 UT) TABS Take 2,000 Units by mouth daily.  . cycloSPORINE (RESTASIS) 0.05 % ophthalmic emulsion 1 drop 2 (two) times daily.   Marland Kitchen donepezil (ARICEPT) 10 MG tablet Take 1 tablet (10 mg total) by mouth at bedtime.  . finasteride (PROSCAR) 5 MG tablet Take 5 mg by mouth daily.  Marland Kitchen latanoprost (XALATAN) 0.005 % ophthalmic solution 1 drop at bedtime.  . mirtazapine (REMERON) 45 MG tablet Take 1 tablet (45 mg total) by mouth at bedtime. Needs appt with pcp for additional refills  . Multiple Vitamin (MULTIVITAMIN) capsule Take 1 capsule by mouth daily.  . pantoprazole (PROTONIX) 40 MG tablet Take 1 tablet (40 mg total) by mouth daily.  . valACYclovir (VALTREX) 500 MG tablet Take 500 mg by mouth as needed.    No facility-administered encounter medications on file as of 02/06/2021.    Allergies (verified) Sulfamethoxazole and Sulfamethoxazole-trimethoprim   History: Past Medical History:   Diagnosis Date  . Barrett's esophagus   . Cataract   . Diverticulosis of colon (without mention of hemorrhage)   . Elevated PSA   . Esophageal stricture   . GERD (gastroesophageal reflux disease)   . Glaucoma   . Gout   . Herpes   . Hiatal hernia   . History of colon cancer   . HOH (hard of hearing)   . Hyperlipidemia   . Hypersomnia, persistent 11/24/2013   Reports Epworth of 10 and higher on CPAP with  good compliance.   . Hypertrophy of prostate without urinary obstruction and other lower urinary tract symptoms (LUTS)   . Impotence of organic origin   . Insomnia   . Memory change 04/19/2013  . OSA on CPAP   . Prostate cancer ([place])   . Prostatitis   . Stroke ([place])    tia in  2010  . TIA (transient ischemic attack)   . Vertigo    Past Surgical History:  Procedure Laterality Date  . EYE SURGERY     both cataracts  . INTRACAPSULAR CATARACT EXTRACTION     Lt eye  . MASS EXCISION N/A 07/29/2013   Procedure: MINOR EXCISION OF CHEST KELOID CONTRACTURE;  Surgeon: Theodoro Kos, DO;  Location: [facility];  Service: Plastics;  Laterality: N/A;  ACELL placement  . MINOR GRAFT APPLICATION N/A 0/06/6760   Procedure: MINOR GRAFT APPLICATION A CELL;  Surgeon: Theodoro Kos, DO;  Location: [facility];  Service: Clinical cytogeneticist;  Laterality: N/A;  . REFRACTIVE SURGERY     Family History  Problem Relation Age of Onset  . Cirrhosis Mother   . Jaundice Mother   . Hyperlipidemia Father   . Heart disease Father   . Colon cancer Neg Hx   . Esophageal cancer Neg Hx   . Rectal cancer Neg Hx   . Stomach cancer Neg Hx    Social History   Socioeconomic History  . Marital status: Single    Spouse name: Not on file  . Number of children: 2  . Years of education: 9  . Highest education level: Not on file  Occupational History  . Occupation: Retired  Tobacco Use  . Smoking status: Former Smoker    Types: Cigarettes    Quit date: 07/23/1962    Years since  quitting: 58.5  . Smokeless tobacco: Never Used  . Tobacco comment: Stopped in 1963  Vaping Use  . Vaping Use: Never used  Substance and Sexual Activity  . Alcohol use: Yes    Comment: occasionally  . Drug use: No  . Sexual activity: Yes    Comment: 5 partners  Other Topics Concern  . Not on file  Social History Narrative   Patient is single.   Patient has two children.   Patient is retired.   Patient has a college education.   Patient is right-handed.   Patient drinks about 3 cups of coffee daily.   Patient gets regular exercise.   Social Determinants of Health   Financial Resource Strain: Low Risk   . Difficulty of Paying Living Expenses: Not hard at all  Food Insecurity: No Food Insecurity  . Worried About Charity fundraiser in the Last Year: Never true  . Ran Out of Food in the Last Year: Never true  Transportation Needs: No Transportation Needs  . Lack of Transportation (Medical): No  . Lack of Transportation (Non-Medical): No  Physical Activity: Sufficiently Active  . Days of Exercise per Week: 3 days  . Minutes of Exercise per Session: 60 min  Stress: No Stress Concern Present  . Feeling of Stress : Not at all  Social Connections: Socially Isolated  . Frequency of Communication with Friends and Family: More than three times a week  . Frequency of Social Gatherings with Friends and Family: More than three times a week  . Attends Religious Services: Never  . Active Member of Clubs or Organizations: No  . Attends Archivist Meetings: Never  . Marital Status: Never married    Tobacco Counseling Counseling given: Not Answered Comment: Stopped in 1963   Clinical Intake:  Pre-visit preparation completed: Yes  Pain : No/denies pain     Nutritional Status: BMI of 19-24  Normal Nutritional Risks: None Diabetes: No  How often do you need to have someone help you when you read instructions, pamphlets, or other written materials from your doctor or  pharmacy?: 1 - Never  Diabetic?No  Interpreter Needed?: No  Information entered by :: Caroleen Hamman LPN   Activities of Daily Living In your present state of health, do you have any difficulty performing the following activities: 02/06/2021  Hearing? N  Vision? N  Difficulty concentrating or making decisions? N  Walking or climbing stairs? N  Dressing or bathing? N  Doing errands, shopping? N  Preparing Food and eating ? N  Using the Toilet? N  In the past six months, have you accidently leaked urine? N  Do you  have problems with loss of bowel control? N  Managing your Medications? N  Managing your Finances? N  Housekeeping or managing your Housekeeping? N  Some recent data might be hidden    Patient Care Team: Libby Maw, MD as PCP - General (Family Medicine) Melissa Montane, MD as Consulting Physician (Otolaryngology) Dohmeier, Asencion Partridge, MD as Consulting Physician (Neurology) Dillingham, Loel Lofty, DO as Attending Physician (Plastic Surgery) Warden Fillers, MD as Consulting Physician (Ophthalmology) Franchot Gallo, MD as Consulting Physician (Urology) Kathrynn Ducking, MD as Consulting Physician (Neurology) Germaine Pomfret, [facility] as Pharmacist (Pharmacist)  Indicate any recent Medical Services you may have received from other than [facility] providers in the past year (date may be approximate).     Assessment:   This is a routine wellness examination for [name].  Hearing/Vision screen  Hearing Screening   125Hz  250Hz  500Hz  1000Hz  2000Hz  3000Hz  4000Hz  6000Hz  8000Hz   Right ear:           Left ear:           Comments: No issues  Vision Screening Comments: Reading glasses Last eye exam-2-3 months ago-Dr. Katy Fitch  Dietary issues and exercise activities discussed: Current Exercise Habits: Home exercise routine, Type of exercise: strength training/weights;walking, Time (Minutes): 60, Frequency (Times/Week): 3, Weekly Exercise (Minutes/Week): 180, Intensity:  Mild, Exercise limited by: None identified  Goals    . Chronic Care Management     CARE PLAN ENTRY  Current Barriers:  . Chronic Disease Management support, education, and care coordination needs related to Hyperlipidemia, GERD, BPH, Gout, and Insmonia   Hyperlipidemia Lab Results  Component Value Date/Time   LDLCALC 111 (H) 07/29/2019 10:04 AM   LDLCALC 120 (H) 07/28/2017 03:04 PM   LDLDIRECT 112.0 07/29/2019 10:04 AM   . Pharmacist Clinical Goal(s): o Over the next 90 days, patient will work with PharmD and providers to achieve LDL goal < 100 . Interventions: o Recommend limiting red meat and incorporating more fresh fruit and vegetables into daily diet. Gout . Pharmacist Clinical Goal(s) o Over the next 90 days, patient will work with PharmD and providers to prevent gout flares and achieve Uric Acid less than 6.  . Current regimen:  o None . Interventions: o Recommend limiting consumption of Purine-Rich foods (red meat, alcohol, organ meat)   Insomnia . Pharmacist Clinical Goal(s) o Over the next 90 days, patient will work with PharmD and providers to help maintain healthy sleep  . Current regimen:  o Mirtazapine 45 mg  o Belsomra 10 mg  . Interventions: o Pharmacist will investigate cost saving options for Belsomra . Patient self care activities - Over the next 90 days, patient will: o Continue practicing healthy sleep hygiene habits and use CPAP nightly.  Medication management . Pharmacist Clinical Goal(s): o Over the next 90 days, patient will work with PharmD and providers to achieve optimal medication adherence . Current pharmacy: [facility] . Interventions o Comprehensive medication review performed. o Utilize [facility] for medication synchronization, packaging and delivery  o Verbal consent obtained for [facility] enhanced pharmacy services (medication synchronization, adherence packaging, delivery coordination). A medication sync plan was  created to allow patient to get all medications delivered once every 30 to 90 days per patient preference. Patient understands they have freedom to choose pharmacy and clinical pharmacist will coordinate care between all prescribers and [facility].  . Patient self care activities - Over the next 90 days, patient will: o Request new prescriptions be sent to [facility]  medications as prescribed o Report any questions, concerns, or medication refills to Pharmacist and/or provider(s)    . Maintain healthy life and independence      Depression Screen PHQ 2/9 Scores 02/06/2021 02/07/2020 02/02/2020 07/28/2017 07/24/2016 12/10/2015 04/18/2015  PHQ - 2 Score 0 0 0 0 0 0 0  PHQ- 9 Score - 3 - - - - -    Fall Risk Fall Risk  02/06/2021 08/31/2020 02/07/2020 02/02/2020 09/29/2019  Falls in the past year? 0 0 0 0 0  Comment - - - - Emmi Telephone Survey: data to providers prior to load  Number falls in past yr: 0 - - 0 -  Injury with Fall? 0 - - 0 -  Follow up Falls prevention discussed - - Education provided;Falls prevention discussed -    FALL RISK PREVENTION PERTAINING TO THE HOME:  Any stairs in or around the home? No  Home free of loose throw rugs in walkways, pet beds, electrical cords, etc? Yes  Adequate lighting in your home to reduce risk of falls? Yes   ASSISTIVE DEVICES UTILIZED TO PREVENT FALLS:  Life alert? No  Use of a cane, walker or w/c? No  Grab bars in the bathroom? No  Shower chair or bench in shower? No  Elevated toilet seat or a handicapped toilet? No   TIMED UP AND GO:  Was the test performed? Yes .  Length of time to ambulate 10 feet: 11 sec.   Gait steady and fast without use of assistive device  Cognitive Function: Patient is followed by Neurologist for memory changes. MMSE - Mini Mental State Exam 11/25/2016 05/21/2016 11/20/2015 05/16/2015  Orientation to time 5 5 5 5   Orientation to Place 5 5 5 5   Registration 3 3 3 3   Attention/ Calculation 3 4 5 5    Recall 2 3 3 3   Language- name 2 objects 2 2 2 2   Language- repeat 1 1 1 1   Language- follow 3 step command 3 3 2 3   Language- read & follow direction 1 1 1 1   Write a sentence 1 1 0 1  Copy design 1 1 1 1   Total score 27 29 28 30    Montreal Cognitive Assessment  08/31/2020  Visuospatial/ Executive (0/5) 4  Naming (0/3) 3  Attention: Read list of digits (0/2) 2  Attention: Read list of letters (0/1) 1  Attention: Serial 7 subtraction starting at 100 (0/3) 3  Language: Repeat phrase (0/2) 2  Language : Fluency (0/1) 1  Abstraction (0/2) 2  Delayed Recall (0/5) 2  Orientation (0/6) 6  Total 26      Immunizations Immunization History  Administered Date(s) Administered  . Fluad Quad(high Dose 65+) 07/29/2019, 08/14/2020  . Influenza, Seasonal, Injecte, Preservative Fre 10/14/2012  . Influenza,inj,Quad PF,6+ Mos 09/15/2015, 07/24/2016, 07/28/2017  . Influenza-Unspecified 10/04/2013, 07/29/2018  . PFIZER(Purple Top)SARS-COV-2 Vaccination 11/29/2019, 12/20/2019  . Pneumococcal Conjugate-13 04/18/2015  . Pneumococcal Polysaccharide-23 04/06/2013  . Td 02/03/2008  . Zoster Recombinat (Shingrix) 06/10/2018    TDAP status: Due, Education has been provided regarding the importance of this vaccine. Advised may receive this vaccine at local pharmacy or Health Dept. Aware to provide a copy of the vaccination record if obtained from local pharmacy or Health Dept. Verbalized acceptance and understanding.  Flu Vaccine status: Up to date  Pneumococcal vaccine status: Up to date  Covid-19 vaccine status: Completed vaccines per patient. Date of booster unknown  Qualifies for Shingles Vaccine? No   Zostavax completed Yes  Shingrix Completed?: Yes  Screening Tests Health Maintenance  Topic Date Due  . COVID-19 Vaccine (3 - Pfizer risk 4-dose series) 01/17/2020  . TETANUS/TDAP  07/25/2023 (Originally 02/20/2018)  . INFLUENZA VACCINE  06/04/2021  . PNA vac Low Risk Adult  Completed   . HPV VACCINES  Aged Out    Health Maintenance  Health Maintenance Due  Topic Date Due  . COVID-19 Vaccine (3 - Pfizer risk 4-dose series) 01/17/2020    Colorectal cancer screening: No longer required.   Lung Cancer Screening: (Low Dose CT Chest recommended if Age 60-80 years, 30 pack-year currently smoking OR have quit w/in 15years.) does not qualify.    Additional Screening:  Hepatitis C Screening: does not qualify  Vision Screening: Recommended annual ophthalmology exams for early detection of glaucoma and other disorders of the eye. Is the patient up to date with their annual eye exam?  Yes  Who is the provider or what is the name of the office in which the patient attends annual eye exams? Dr. Katy Fitch  Dental Screening: Recommended annual dental exams for proper oral hygiene  Community Resource Referral / Chronic Care Management: CRR required this visit?  No   CCM required this visit?  No      Plan:     I have personally reviewed and noted the following in the patient's chart:   . Medical and social history . Use of alcohol, tobacco or illicit drugs  . Current medications and supplements . Functional ability and status . Nutritional status . Physical activity . Advanced directives . List of other physicians . Hospitalizations, surgeries, and ER visits in previous 12 months . Vitals . Screenings to include cognitive, depression, and falls . Referrals and appointments  In addition, I have reviewed and discussed with patient certain preventive protocols, quality metrics, and best practice recommendations. A written personalized care plan for preventive services as well as general preventive health recommendations were provided to patient.   Patient to access avs on mychart  Marta Antu, [place]   01/06/3613  Nurse Health Advisor  Nurse Notes: None

## 2021-02-06 NOTE — Patient Instructions (Signed)
[name] , Thank you for taking time to come for your Medicare Wellness Visit. I appreciate your ongoing commitment to your health goals. Please review the following plan we discussed and let me know if I can assist you in the future.   Screening recommendations/referrals: Colonoscopy: No longer required Recommended yearly ophthalmology/optometry visit for glaucoma screening and checkup Recommended yearly dental visit for hygiene and checkup  Vaccinations: Influenza vaccine: Up to date Pneumococcal vaccine: Completed vaccines Tdap vaccine: Discuss with pharmacy Shingles vaccine: Completed one dose Covid-19: Completed vaccines  Advanced directives: Declined information today  Conditions/risks identified: See problem list  Next appointment: Follow up in one year for your annual wellness visit. 02/12/2022 @ 9:00  Preventive Care 84 Years and Older, Male Preventive care refers to lifestyle choices and visits with your health care provider that can promote health and wellness. What does preventive care include?  A yearly physical exam. This is also called an annual well check.  Dental exams once or twice a year.  Routine eye exams. Ask your health care provider how often you should have your eyes checked.  Personal lifestyle choices, including:  Daily care of your teeth and gums.  Regular physical activity.  Eating a healthy diet.  Avoiding tobacco and drug use.  Limiting alcohol use.  Practicing safe sex.  Taking low doses of aspirin every day.  Taking vitamin and mineral supplements as recommended by your health care provider. What happens during an annual well check? The services and screenings done by your health care provider during your annual well check will depend on your age, overall health, lifestyle risk factors, and family history of disease. Counseling  Your health care provider may ask you questions about your:  Alcohol use.  Tobacco use.  Drug  use.  Emotional well-being.  Home and relationship well-being.  Sexual activity.  Eating habits.  History of falls.  Memory and ability to understand (cognition).  Work and work Statistician. Screening  You may have the following tests or measurements:  Height, weight, and BMI.  Blood pressure.  Lipid and cholesterol levels. These may be checked every 5 years, or more frequently if you are over 39 years old.  Skin check.  Lung cancer screening. You may have this screening every year starting at age 84 if you have a 30-pack-year history of smoking and currently smoke or have quit within the past 15 years.  Fecal occult blood test (FOBT) of the stool. You may have this test every year starting at age 84.  Flexible sigmoidoscopy or colonoscopy. You may have a sigmoidoscopy every 5 years or a colonoscopy every 10 years starting at age 84.  Prostate cancer screening. Recommendations will vary depending on your family history and other risks.  Hepatitis C blood test.  Hepatitis B blood test.  Sexually transmitted disease (STD) testing.  Diabetes screening. This is done by checking your blood sugar (glucose) after you have not eaten for a while (fasting). You may have this done every 1-3 years.  Abdominal aortic aneurysm (AAA) screening. You may need this if you are a current or former smoker.  Osteoporosis. You may be screened starting at age 84 if you are at high risk. Talk with your health care provider about your test results, treatment options, and if necessary, the need for more tests. Vaccines  Your health care provider may recommend certain vaccines, such as:  Influenza vaccine. This is recommended every year.  Tetanus, diphtheria, and acellular pertussis (Tdap, Td) vaccine. You  may need a Td booster every 10 years.  Zoster vaccine. You may need this after age 84.  Pneumococcal 13-valent conjugate (PCV13) vaccine. One dose is recommended after age  84.  Pneumococcal polysaccharide (PPSV23) vaccine. One dose is recommended after age 84. Talk to your health care provider about which screenings and vaccines you need and how often you need them. This information is not intended to replace advice given to you by your health care provider. Make sure you discuss any questions you have with your health care provider. Document Released: 11/17/2015 Document Revised: 07/10/2016 Document Reviewed: 08/22/2015 Elsevier Interactive Patient Education  2017 [place] Prevention in the Home Falls can cause injuries. They can happen to people of all ages. There are many things you can do to make your home safe and to help prevent falls. What can I do on the outside of my home?  Regularly fix the edges of walkways and driveways and fix any cracks.  Remove anything that might make you trip as you walk through a door, such as a raised step or threshold.  Trim any bushes or trees on the path to your home.  Use bright outdoor lighting.  Clear any walking paths of anything that might make someone trip, such as rocks or tools.  Regularly check to see if handrails are loose or broken. Make sure that both sides of any steps have handrails.  Any raised decks and porches should have guardrails on the edges.  Have any leaves, snow, or ice cleared regularly.  Use sand or salt on walking paths during winter.  Clean up any spills in your garage right away. This includes oil or grease spills. What can I do in the bathroom?  Use night lights.  Install grab bars by the toilet and in the tub and shower. Do not use towel bars as grab bars.  Use non-skid mats or decals in the tub or shower.  If you need to sit down in the shower, use a plastic, non-slip stool.  Keep the floor dry. Clean up any water that spills on the floor as soon as it happens.  Remove soap buildup in the tub or shower regularly.  Attach bath mats securely with double-sided  non-slip rug tape.  Do not have throw rugs and other things on the floor that can make you trip. What can I do in the bedroom?  Use night lights.  Make sure that you have a light by your bed that is easy to reach.  Do not use any sheets or blankets that are too big for your bed. They should not hang down onto the floor.  Have a firm chair that has side arms. You can use this for support while you get dressed.  Do not have throw rugs and other things on the floor that can make you trip. What can I do in the kitchen?  Clean up any spills right away.  Avoid walking on wet floors.  Keep items that you use a lot in easy-to-reach places.  If you need to reach something above you, use a strong step stool that has a grab bar.  Keep electrical cords out of the way.  Do not use floor polish or wax that makes floors slippery. If you must use wax, use non-skid floor wax.  Do not have throw rugs and other things on the floor that can make you trip. What can I do with my stairs?  Do not leave any items  on the stairs.  Make sure that there are handrails on both sides of the stairs and use them. Fix handrails that are broken or loose. Make sure that handrails are as long as the stairways.  Check any carpeting to make sure that it is firmly attached to the stairs. Fix any carpet that is loose or worn.  Avoid having throw rugs at the top or bottom of the stairs. If you do have throw rugs, attach them to the floor with carpet tape.  Make sure that you have a light switch at the top of the stairs and the bottom of the stairs. If you do not have them, ask someone to add them for you. What else can I do to help prevent falls?  Wear shoes that:  Do not have high heels.  Have rubber bottoms.  Are comfortable and fit you well.  Are closed at the toe. Do not wear sandals.  If you use a stepladder:  Make sure that it is fully opened. Do not climb a closed stepladder.  Make sure that both  sides of the stepladder are locked into place.  Ask someone to hold it for you, if possible.  Clearly mark and make sure that you can see:  Any grab bars or handrails.  First and last steps.  Where the edge of each step is.  Use tools that help you move around (mobility aids) if they are needed. These include:  Canes.  Walkers.  Scooters.  Crutches.  Turn on the lights when you go into a dark area. Replace any light bulbs as soon as they burn out.  Set up your furniture so you have a clear path. Avoid moving your furniture around.  If any of your floors are uneven, fix them.  If there are any pets around you, be aware of where they are.  Review your medicines with your doctor. Some medicines can make you feel dizzy. This can increase your chance of falling. Ask your doctor what other things that you can do to help prevent falls. This information is not intended to replace advice given to you by your health care provider. Make sure you discuss any questions you have with your health care provider. Document Released: 08/17/2009 Document Revised: 03/28/2016 Document Reviewed: 11/25/2014 Elsevier Interactive Patient Education  2017 [employer].

## 2021-02-28 ENCOUNTER — Other Ambulatory Visit: Payer: Self-pay | Admitting: Family Medicine

## 2021-02-28 DIAGNOSIS — F341 Dysthymic disorder: Secondary | ICD-10-CM

## 2021-02-28 DIAGNOSIS — G4700 Insomnia, unspecified: Secondary | ICD-10-CM

## 2021-03-01 ENCOUNTER — Telehealth: Payer: Self-pay

## 2021-03-01 NOTE — Progress Notes (Signed)
    Chronic Care Management Pharmacy Assistant   Name: [name]  MRN: [id number] DOB: 1935/09/13  Reason for Encounter: Medication Review/Medication Coordination Call.   Recent office visits:  02/06/2021 Caroleen Hamman LPN (PCP Office)  Recent consult visits:  No recent [facility] visits:  None in previous 6 months  Medications: Outpatient Encounter Medications as of 03/01/2021  Medication Sig  . aspirin 81 MG tablet Take 81 mg by mouth daily.  . Cholecalciferol (VITAMIN D3) 50 MCG (2000 UT) TABS Take 2,000 Units by mouth daily.  . cycloSPORINE (RESTASIS) 0.05 % ophthalmic emulsion 1 drop 2 (two) times daily.   Marland Kitchen donepezil (ARICEPT) 10 MG tablet Take 1 tablet (10 mg total) by mouth at bedtime.  . finasteride (PROSCAR) 5 MG tablet Take 5 mg by mouth daily.  Marland Kitchen latanoprost (XALATAN) 0.005 % ophthalmic solution 1 drop at bedtime.  . mirtazapine (REMERON) 45 MG tablet TAKE ONE TABLET BY MOUTH EVERYDAY AT BEDTIME  . Multiple Vitamin (MULTIVITAMIN) capsule Take 1 capsule by mouth daily.  . pantoprazole (PROTONIX) 40 MG tablet Take 1 tablet (40 mg total) by mouth daily.  . valACYclovir (VALTREX) 500 MG tablet Take 500 mg by mouth as needed.    No facility-administered encounter medications on file as of 03/01/2021.    Star Rating Drugs: None ID  Reviewed chart for medication changes ahead of medication coordination call.   BP Readings from Last 3 Encounters:  02/06/21 126/70  09/22/20 122/84  08/31/20 135/75    No results found for: HGBA1C   Patient obtains medications through Adherence Packaging  30 Days   Last adherence delivery included:  Aspirin 81 mg Daily - bedtime  Vitamin D3 50 MCG Daily- Breakfast  Donepezil 10 mg tablet Daily -Bedtime  Finasteride 5 mg tablet Daily-Bedtime  Mirtazapine 45 mg tablet Daily-Bedtime  Pantoprazole 40 mg tablet Daily- Breakfast  Multivitamin Capsule Daily-Breakfast   Patient declined medication last  month:   Latanoprost 0.005% Eye Drops -one drop in each eye at bedtime-Adequate supply  Valacyclovir 1000 mg tablet- PRN Adequate supply  Restasis 0.05% two times Daily-Adequate supply   Patient is due for next adherence delivery on: 03/12/2021 (2nd Routine) . Called patient and reviewed medications and coordinated delivery.  This delivery to include:  Aspirin 81 mg Daily - bedtime  Vitamin D3 50 MCG Daily- Breakfast  Donepezil 10 mg tablet Daily -Bedtime  Finasteride 5 mg tablet Daily-Bedtime  Mirtazapine 45 mg tablet Daily-Bedtime  Pantoprazole 40 mg tablet Daily- Breakfast  Multivitamin Capsule Daily-Breakfast  Patient declined the following medications:   Latanoprost 0.005% Eye Drops -one drop in each eye at bedtime-Adequate supply  Valacyclovir 1000 mg tablet- PRN Adequate supply  Restasis 0.05% two times Daily-Adequate supply   Patient needs refills for None ID.  Confirmed delivery date of 03/23/2021, advised patient that pharmacy will contact them the morning of delivery.  Rafael Hernandez Pharmacist Assistant [phone]

## 2021-04-04 ENCOUNTER — Telehealth: Payer: Self-pay

## 2021-04-04 NOTE — Progress Notes (Signed)
    Chronic Care Management Pharmacy Assistant   Name: [name]  MRN: [id number] DOB: 12-16-1934  Reason for Encounter: Medication Review/Medication Coordination Call   Recent office visits: No Recent Office visits  Recent consult visits:  No Recent Consult visits  Hospital visits:  None in previous 6 months  Medications: Outpatient Encounter Medications as of 04/04/2021  Medication Sig  . aspirin 81 MG tablet Take 81 mg by mouth daily.  . Cholecalciferol (VITAMIN D3) 50 MCG (2000 UT) TABS Take 2,000 Units by mouth daily.  . cycloSPORINE (RESTASIS) 0.05 % ophthalmic emulsion 1 drop 2 (two) times daily.   Marland Kitchen donepezil (ARICEPT) 10 MG tablet Take 1 tablet (10 mg total) by mouth at bedtime.  . finasteride (PROSCAR) 5 MG tablet Take 5 mg by mouth daily.  Marland Kitchen latanoprost (XALATAN) 0.005 % ophthalmic solution 1 drop at bedtime.  . mirtazapine (REMERON) 45 MG tablet TAKE ONE TABLET BY MOUTH EVERYDAY AT BEDTIME  . Multiple Vitamin (MULTIVITAMIN) capsule Take 1 capsule by mouth daily.  . pantoprazole (PROTONIX) 40 MG tablet Take 1 tablet (40 mg total) by mouth daily.  . valACYclovir (VALTREX) 500 MG tablet Take 500 mg by mouth as needed.    No facility-administered encounter medications on file as of 04/04/2021.   Star Rating Drugs: None ID  Reviewed chart for medication changes ahead of medication coordination call.   BP Readings from Last 3 Encounters:  02/06/21 126/70  09/22/20 122/84  08/31/20 135/75    No results found for: HGBA1C   Patient obtains medications through Adherence Packaging  30 Days   Last adherence delivery included:   Aspirin 81 mg Daily - bedtime  Vitamin D3 50 MCG Daily- Breakfast  Donepezil 10 mg tablet Daily -Bedtime  Finasteride 5 mg tablet Daily-Bedtime  Mirtazapine 45 mg tablet Daily-Bedtime  Pantoprazole 40 mg tablet Daily- Breakfast  Multivitamin Capsule Daily-Breakfast   Patient declined medication last month .   Latanoprost  0.005% Eye Drops -one drop in each eye at bedtime-Adequate supply  Valacyclovir 1000 mg tablet- PRN Adequate supply  Restasis 0.05% two times Daily-Adequate supply   Patient is due for next adherence delivery on: 04/11/2021 (2nd route) Called patient and reviewed medications and coordinated delivery.  This delivery to include:  Aspirin 81 mg Daily - bedtime  Vitamin D3 50 MCG Daily- Breakfast  Donepezil 10 mg tablet Daily -Bedtime  Finasteride 5 mg tablet Daily-Bedtime  Mirtazapine 45 mg tablet Daily-Bedtime  Pantoprazole 40 mg tablet Daily- Breakfast  Multivitamin Capsule Daily-Breakfast   Patient declined the following medications   Latanoprost 0.005% Eye Drops -one drop in each eye at bedtime-Adequate supply  Valacyclovir 1000 mg tablet- PRN Adequate supply  Restasis 0.05% two times Daily-Adequate supply  Patient needs refills for None ID  Confirmed delivery date of 04/05/2021 advised patient that pharmacy will contact them the morning of delivery.  Patient scheduled a telephone f/u appointment with the clinical pharmacist  On July 11 @1030 . Sent message to scheduler.  [place] Pharmacist Assistant [phone]

## 2021-04-25 ENCOUNTER — Telehealth: Payer: Self-pay

## 2021-04-25 NOTE — Progress Notes (Signed)
    Chronic Care Management Pharmacy Assistant   Name: [name]  MRN: [id number] DOB: 1935-06-16  Reason for Encounter: Medication Review/Medication Coordination call.    Recent office visits:  No recent Office Visit  Recent consult visits:  No recent [facility] visits:  None in previous 6 months  Medications: Outpatient Encounter Medications as of 04/25/2021  Medication Sig   aspirin 81 MG tablet Take 81 mg by mouth daily.   Cholecalciferol (VITAMIN D3) 50 MCG (2000 UT) TABS Take 2,000 Units by mouth daily.   cycloSPORINE (RESTASIS) 0.05 % ophthalmic emulsion 1 drop 2 (two) times daily.    donepezil (ARICEPT) 10 MG tablet Take 1 tablet (10 mg total) by mouth at bedtime.   finasteride (PROSCAR) 5 MG tablet Take 5 mg by mouth daily.   latanoprost (XALATAN) 0.005 % ophthalmic solution 1 drop at bedtime.   mirtazapine (REMERON) 45 MG tablet TAKE ONE TABLET BY MOUTH EVERYDAY AT BEDTIME   Multiple Vitamin (MULTIVITAMIN) capsule Take 1 capsule by mouth daily.   pantoprazole (PROTONIX) 40 MG tablet Take 1 tablet (40 mg total) by mouth daily.   valACYclovir (VALTREX) 500 MG tablet Take 500 mg by mouth as needed.    No facility-administered encounter medications on file as of 04/25/2021.    Care Gaps: None ID Star Rating Drugs: None ID  Reviewed chart for medication changes ahead of medication coordination call.  BP Readings from Last 3 Encounters:  02/06/21 126/70  09/22/20 122/84  08/31/20 135/75    No results found for: HGBA1C   Patient obtains medications through Adherence Packaging  30 Days   Last adherence delivery included:  Aspirin 81 mg Daily - bedtime Vitamin D3 50 MCG Daily- Breakfast Donepezil 10 mg tablet Daily -Bedtime Finasteride 5 mg tablet Daily-Bedtime Mirtazapine 45 mg tablet Daily-Bedtime Pantoprazole 40 mg tablet Daily- Breakfast Multivitamin Capsule Daily-Breakfast  Patient declined medications last month: Latanoprost 0.005%  Eye Drops -one drop in each eye at bedtime-Adequate supply Valacyclovir 1000 mg tablet- PRN Adequate supply Restasis 0.05% two times Daily-Adequate supply  Patient is due for next adherence delivery on: 05/03/2021. Called patient and reviewed medications and coordinated delivery.  This delivery to include: Aspirin 81 mg Daily - bedtime Vitamin D3 50 MCG Daily- Breakfast Donepezil 10 mg tablet Daily -Bedtime Finasteride 5 mg tablet Daily-Bedtime Mirtazapine 45 mg tablet Daily-Bedtime Pantoprazole 40 mg tablet Daily- Breakfast Multivitamin Capsule Daily-Breakfast   Patient declined the following medications: Latanoprost 0.005% Eye Drops -one drop in each eye at bedtime-Adequate supply Valacyclovir 1000 mg tablet- PRN Adequate supply Restasis 0.05% two times Daily-Adequate supply  Patient needs refills for None ID.  Confirmed delivery date of 05/03/2021, advised patient that pharmacy will contact them the morning of delivery.  Patient is schedule for a telephone follow up visit with the clinical pharmacist on 05/14/2021 at 10:15 am.   Patient had a annual wellness visit on 02/06/2021 at his PCP office and has a future Annual wellness visit schedule  on 02/12/2022.  [place] Pharmacist Assistant [phone]

## 2021-05-10 ENCOUNTER — Telehealth: Payer: Self-pay | Admitting: Neurology

## 2021-05-10 ENCOUNTER — Telehealth: Payer: Self-pay

## 2021-05-10 NOTE — Telephone Encounter (Signed)
Pt called, not sleeping well for about 2 months, mask not fitting good.  I Deneise Lever) gave him contact info to [employer].

## 2021-05-10 NOTE — Progress Notes (Signed)
Reached out to patient to ask if we could reschedule his telephone appointment on 05/14/2021 with the clinical pharmacist to 06/26/2021 at 10:00 am for a follow up.Patient states that fine he can call anytime he prefers.Sent message to scheduler.  [place] Pharmacist Assistant [phone]

## 2021-05-10 NOTE — Telephone Encounter (Signed)
Called the patient to get more information. In reviewing the data, the mask does appear to not be sealing well. Encouraged the patient to reach out to the DME company to have a in person mask refit. Pt verbalized understanding. Pt was appreciative for the call back.

## 2021-05-14 ENCOUNTER — Telehealth: Payer: PPO

## 2021-05-16 DIAGNOSIS — G4733 Obstructive sleep apnea (adult) (pediatric): Secondary | ICD-10-CM | POA: Diagnosis not present

## 2021-05-20 ENCOUNTER — Other Ambulatory Visit: Payer: Self-pay | Admitting: Family Medicine

## 2021-05-20 DIAGNOSIS — G4700 Insomnia, unspecified: Secondary | ICD-10-CM

## 2021-05-20 DIAGNOSIS — F341 Dysthymic disorder: Secondary | ICD-10-CM

## 2021-05-23 ENCOUNTER — Telehealth: Payer: Self-pay

## 2021-05-23 NOTE — Progress Notes (Signed)
    Chronic Care Management Pharmacy Assistant   Name: [name]  MRN: [id number] DOB: [birth year]  Reason for Encounter: Medication Review/ Medication Coordination Call.   Recent office visits:  No recent Office Visit  Recent consult visits:  No recent [facility] visits:  None in previous 6 months  Medications: Outpatient Encounter Medications as of 05/23/2021  Medication Sig   aspirin 81 MG tablet Take 81 mg by mouth daily.   Cholecalciferol (VITAMIN D3) 50 MCG (2000 UT) TABS Take 2,000 Units by mouth daily.   cycloSPORINE (RESTASIS) 0.05 % ophthalmic emulsion 1 drop 2 (two) times daily.    donepezil (ARICEPT) 10 MG tablet Take 1 tablet (10 mg total) by mouth at bedtime.   finasteride (PROSCAR) 5 MG tablet Take 5 mg by mouth daily.   latanoprost (XALATAN) 0.005 % ophthalmic solution 1 drop at bedtime.   mirtazapine (REMERON) 45 MG tablet TAKE ONE TABLET BY MOUTH EVERYDAY AT BEDTIME   Multiple Vitamin (MULTIVITAMIN) capsule Take 1 capsule by mouth daily.   pantoprazole (PROTONIX) 40 MG tablet Take 1 tablet (40 mg total) by mouth daily.   valACYclovir (VALTREX) 500 MG tablet Take 500 mg by mouth as needed.    No facility-administered encounter medications on file as of 05/23/2021.    Care Gaps: Shinfrix Vaccine COVID-19 Vaccine Star Rating Drugs: None ID Medication Fill Gaps: latanoprost 0.005 % ophthalmic solution last filled 12/04/2020 25 day supply  Reviewed chart for medication changes ahead of medication coordination call.  BP Readings from Last 3 Encounters:  02/06/21 126/70  09/22/20 122/84  08/31/20 135/75    No results found for: HGBA1C   Patient obtains medications through Adherence Packaging  30 Days   Last adherence delivery included: Aspirin 81 mg Daily - bedtime Vitamin D3 50 MCG Daily- Breakfast Donepezil 10 mg tablet Daily -Bedtime Finasteride 5 mg tablet Daily-Bedtime Mirtazapine 45 mg tablet Daily-Bedtime Pantoprazole 40 mg  tablet Daily- Breakfast Multivitamin Capsule Daily-Breakfast  Patient declined medications last month: Latanoprost 0.005% Eye Drops -one drop in each eye at bedtime-Adequate supply Valacyclovir 1000 mg tablet- PRN Adequate supply Restasis 0.05% two times Daily-Adequate supply  Patient is due for next adherence delivery on: 06/01/2021. Called patient and reviewed medications and coordinated delivery.  This delivery to include: Aspirin 81 mg Daily - bedtime Vitamin D3 50 MCG Daily- Breakfast Donepezil 10 mg tablet Daily -Bedtime Finasteride 5 mg tablet Daily-Bedtime Mirtazapine 45 mg tablet Daily-Bedtime Pantoprazole 40 mg tablet Daily- Breakfast Multivitamin Capsule Daily-Breakfast  Patient declined the following medications  Latanoprost 0.005% Eye Drops -one drop in each eye at bedtime-Adequate supply Valacyclovir 1000 mg tablet- PRN Adequate supply Restasis 0.05% two times Daily-Adequate supply  Patient needs refills for None ID.  Confirmed delivery date of 06/01/2021, advised patient that pharmacy will contact them the morning of delivery.  [place] Pharmacist Assistant [phone]

## 2021-05-28 ENCOUNTER — Other Ambulatory Visit: Payer: Self-pay

## 2021-05-29 ENCOUNTER — Ambulatory Visit (INDEPENDENT_AMBULATORY_CARE_PROVIDER_SITE_OTHER): Payer: PPO | Admitting: Family Medicine

## 2021-05-29 ENCOUNTER — Encounter: Payer: Self-pay | Admitting: Family Medicine

## 2021-05-29 VITALS — BP 130/80 | HR 78 | Temp 97.3°F | Wt 142.2 lb

## 2021-05-29 DIAGNOSIS — K219 Gastro-esophageal reflux disease without esophagitis: Secondary | ICD-10-CM

## 2021-05-29 DIAGNOSIS — F341 Dysthymic disorder: Secondary | ICD-10-CM | POA: Diagnosis not present

## 2021-05-29 DIAGNOSIS — G4700 Insomnia, unspecified: Secondary | ICD-10-CM | POA: Diagnosis not present

## 2021-05-29 DIAGNOSIS — G4733 Obstructive sleep apnea (adult) (pediatric): Secondary | ICD-10-CM

## 2021-05-29 DIAGNOSIS — Z0000 Encounter for general adult medical examination without abnormal findings: Secondary | ICD-10-CM | POA: Diagnosis not present

## 2021-05-29 LAB — CBC
HCT: 42.7 % (ref 39.0–52.0)
Hemoglobin: 14.2 g/dL (ref 13.0–17.0)
MCHC: 33.2 g/dL (ref 30.0–36.0)
MCV: 88.7 fl (ref 78.0–100.0)
Platelets: 294 10*3/uL (ref 150.0–400.0)
RBC: 4.82 Mil/uL (ref 4.22–5.81)
RDW: 13.4 % (ref 11.5–15.5)
WBC: 8.5 10*3/uL (ref 4.0–10.5)

## 2021-05-29 LAB — BASIC METABOLIC PANEL
BUN: 16 mg/dL (ref 6–23)
CO2: 29 mEq/L (ref 19–32)
Calcium: 10.6 mg/dL — ABNORMAL HIGH (ref 8.4–10.5)
Chloride: 103 mEq/L (ref 96–112)
Creatinine, Ser: 0.9 mg/dL (ref 0.40–1.50)
GFR: 78.72 mL/min (ref >60.00)
Glucose, Bld: 65 mg/dL — ABNORMAL LOW (ref 70–99)
Potassium: 5.2 mEq/L — ABNORMAL HIGH (ref 3.5–5.1)
Sodium: 140 mEq/L (ref 135–145)

## 2021-05-29 NOTE — Progress Notes (Signed)
Established Patient Office Visit  Subjective:  Patient ID: [name], male    DOB: 02-20-1935  Age: 86 y.o. MRN: [id number]  CC:  Chief Complaint  Patient presents with   Follow-up    6 mo f/u. Med recheck.     HPI [name] presents for 105-monthfollow-up.  Continues to do well with mirtazapine.  Continues to use his CPAP machine between the 2 his sleep is improved.  Also he has decreased his alcohol intake which is helped as well.  No history of hypertension.  History of BPH with prostate cancer.  Continues to see urology.  No further gouty attacks in some time.  GERD is controlled with pantoprazole.  Past Medical History:  Diagnosis Date   Barrett's esophagus    Cataract    Diverticulosis of colon (without mention of hemorrhage)    Elevated PSA    Esophageal stricture    GERD (gastroesophageal reflux disease)    Glaucoma    Gout    Herpes    Hiatal hernia    History of colon cancer    HOH (hard of hearing)    Hyperlipidemia    Hypersomnia, persistent 11/24/2013   Reports Epworth of 10 and higher on CPAP with  good compliance.    Hypertrophy of prostate without urinary obstruction and other lower urinary tract symptoms (LUTS)    Impotence of organic origin    Insomnia    Memory change 04/19/2013   OSA on CPAP    [place]    Prostatitis    [place]    [place]  [place]   TIA (transient ischemic attack)    Vertigo     Past Surgical History:  Procedure Laterality Date   EYE SURGERY     both cataracts   INTRACAPSULAR CATARACT EXTRACTION     Lt eye   MASS EXCISION N/A 07/29/2013   Procedure: MINOR EXCISION OF CHEST KELOID CONTRACTURE;  Surgeon: CTheodoro Kos DO;  [facility]  Service: Plastics;  Laterality: N/A;  ACELL placement   MINOR GRAFT APPLICATION N/A [id number]  Procedure: MINOR GRAFT APPLICATION A CELL;  Surgeon: CTheodoro Kos DO;  [facility]  Service: Plastics;  Laterality:  N/A;   REFRACTIVE SURGERY      Family History  Problem Relation Age of Onset   Cirrhosis Mother    Jaundice Mother    Hyperlipidemia Father    Heart disease Father    Colon cancer Neg Hx    Esophageal cancer Neg Hx    Rectal cancer Neg Hx    Stomach cancer Neg Hx     Social History   Socioeconomic History   Marital status: Single    Spouse name: Not on file   Number of children: 2   Years of education: 15   Highest education level: Not on file  Occupational History   Occupation: Retired  Tobacco Use   Smoking status: Former    Types: Cigarettes    Quit date: 07/23/1962    Years since quitting: 58.8   Smokeless tobacco: Never   Tobacco comments:    Stopped in 1963  Vaping Use   Vaping Use: Never used  Substance and Sexual Activity   Alcohol use: Yes    Comment: occasionally   Drug use: No   Sexual activity: Yes    Comment: 5 partners  Other Topics Concern   Not on file  Social History Narrative  Patient is single.   Patient has two children.   Patient is retired.   Patient has a college education.   Patient is right-handed.   Patient drinks about 3 cups of coffee daily.   Patient gets regular exercise.   Social Determinants of Health   Financial Resource Strain: Low Risk    Difficulty of Paying Living Expenses: Not hard at all  Food Insecurity: No Food Insecurity   Worried About Charity fundraiser in the Last Year: Never true   [place] in the Last Year: Never true  Transportation Needs: No Transportation Needs   Lack of Transportation (Medical): No   Lack of Transportation (Non-Medical): No  Physical Activity: Sufficiently Active   Days of Exercise per Week: 3 days   Minutes of Exercise per Session: 60 min  Stress: No Stress Concern Present   Feeling of Stress : Not at all  Social Connections: Socially Isolated   Frequency of Communication with Friends and Family: More than three times a week   Frequency of Social Gatherings with Friends  and Family: More than three times a week   Attends Religious Services: Never   Marine scientist or Organizations: No   Attends Music therapist: Never   Marital Status: Never married  Human resources officer Violence: Not At Risk   Fear of Current or Ex-Partner: No   Emotionally Abused: No   Physically Abused: No   Sexually Abused: No    Outpatient Medications Prior to Visit  Medication Sig Dispense Refill   aspirin 81 MG tablet Take 81 mg by mouth daily.     Cholecalciferol (VITAMIN D3) 50 MCG (2000 UT) TABS Take 2,000 Units by mouth daily.     cycloSPORINE (RESTASIS) 0.05 % ophthalmic emulsion 1 drop 2 (two) times daily.      donepezil (ARICEPT) 10 MG tablet Take 1 tablet (10 mg total) by mouth at bedtime. 90 tablet 3   finasteride (PROSCAR) 5 MG tablet Take 5 mg by mouth daily.     latanoprost (XALATAN) 0.005 % ophthalmic solution 1 drop at bedtime.     mirtazapine (REMERON) 45 MG tablet TAKE ONE TABLET BY MOUTH EVERYDAY AT BEDTIME 30 tablet 2   Multiple Vitamin (MULTIVITAMIN) capsule Take 1 capsule by mouth daily.     pantoprazole (PROTONIX) 40 MG tablet Take 1 tablet (40 mg total) by mouth daily. 90 tablet 3   valACYclovir (VALTREX) 500 MG tablet Take 500 mg by mouth as needed.      No facility-administered medications prior to visit.    Allergies  Allergen Reactions   Sulfamethoxazole Other (See Comments)    Constipation   Sulfamethoxazole-Trimethoprim     constipation    ROS Review of Systems  Constitutional: Negative.   HENT: Negative.    Eyes:  Negative for photophobia and visual disturbance.  Respiratory: Negative.    Cardiovascular: Negative.   Gastrointestinal: Negative.   Genitourinary:  Negative for difficulty urinating, frequency and urgency.  Musculoskeletal:  Negative for gait problem.  Neurological:  Negative for speech difficulty and weakness.  Psychiatric/Behavioral:  Negative for dysphoric mood and sleep disturbance.      Objective:     Physical Exam Vitals and nursing note reviewed.  Constitutional:      Appearance: Normal appearance. He is normal weight.  HENT:     Head: Normocephalic and atraumatic.     Right Ear: Tympanic membrane, ear canal and external ear normal.     Left Ear:  Tympanic membrane, ear canal and external ear normal.     Mouth/Throat:     Mouth: Mucous membranes are moist.     Pharynx: Oropharynx is clear. No oropharyngeal exudate or posterior oropharyngeal erythema.  Eyes:     General: No scleral icterus.       Right eye: No discharge.        Left eye: No discharge.     Extraocular Movements: Extraocular movements intact.     Conjunctiva/sclera: Conjunctivae normal.     Pupils: Pupils are equal, round, and reactive to light.  Neck:     Vascular: No carotid bruit.  Cardiovascular:     Rate and Rhythm: Normal rate and regular rhythm.  Pulmonary:     Effort: Pulmonary effort is normal.     Breath sounds: Normal breath sounds.  Abdominal:     General: Bowel sounds are normal.  Musculoskeletal:     Cervical back: No rigidity or tenderness.     Right lower leg: No edema.     Left lower leg: No edema.  Lymphadenopathy:     Cervical: No cervical adenopathy.  Skin:    General: Skin is warm and dry.  Neurological:     Mental Status: He is alert and oriented to person, place, and time.  Psychiatric:        Mood and Affect: Mood normal.        Behavior: Behavior normal.    BP 130/80   Pulse 78   Temp (!) 97.3 F (36.3 C) (Temporal)   Wt 142 lb 3.2 oz (64.5 kg)   SpO2 97%   BMI 22.95 kg/m  Wt Readings from Last 3 Encounters:  05/29/21 142 lb 3.2 oz (64.5 kg)  02/06/21 146 lb 6.4 oz (66.4 kg)  09/22/20 147 lb 12.8 oz (67 kg)     Health Maintenance Due  Topic Date Due   Zoster Vaccines- Shingrix (2 of 2) 08/05/2018   COVID-19 Vaccine (3 - Pfizer risk series) 01/17/2020    There are no preventive care reminders to display for this patient.  Lab Results  Component Value Date    TSH 2.340 07/28/2017   Lab Results  Component Value Date   WBC 7.5 02/07/2020   HGB 13.9 02/07/2020   HCT 40.7 02/07/2020   MCV 88.6 02/07/2020   PLT 268.0 02/07/2020   Lab Results  Component Value Date   NA 143 08/14/2020   K 4.9 08/14/2020   CO2 32 08/14/2020   GLUCOSE 97 08/14/2020   BUN 22 08/14/2020   CREATININE 0.93 08/14/2020   BILITOT 0.7 07/29/2019   ALKPHOS 81 07/29/2019   AST 18 07/29/2019   ALT 16 07/29/2019   PROT 6.7 07/29/2019   ALBUMIN 4.4 07/29/2019   CALCIUM 10.6 (H) 08/14/2020   GFR 75.54 08/14/2020   Lab Results  Component Value Date   CHOL 174 07/29/2019   Lab Results  Component Value Date   HDL 48.10 07/29/2019   Lab Results  Component Value Date   LDLCALC 111 (H) 07/29/2019   Lab Results  Component Value Date   TRIG 77.0 07/29/2019   Lab Results  Component Value Date   CHOLHDL 4 07/29/2019   No results found for: HGBA1C    Assessment & Plan:   Problem List Items Addressed This Visit       Respiratory   OSA treated with BiPAP     Digestive   Gastroesophageal reflux disease - Primary     Other  Insomnia, persistent   Healthcare maintenance   Relevant Orders   Basic metabolic panel   CBC   Urinalysis, Routine w reflex microscopic   Dysthymia    No orders of the defined types were placed in this encounter.   Follow-up: Return in about 6 months (around 11/29/2021).  Recheck blood pressure was 130/80.  Continue mirtazapine for dysthymia and insomnia.  Continue pantoprazole for GERD.  Libby Maw, MD

## 2021-05-30 LAB — URINALYSIS, ROUTINE W REFLEX MICROSCOPIC
Bilirubin Urine: NEGATIVE
Hgb urine dipstick: NEGATIVE
Leukocytes,Ua: NEGATIVE
Nitrite: NEGATIVE
RBC / HPF: NONE SEEN (ref 0–?)
Specific Gravity, Urine: 1.025 (ref 1.000–1.030)
Total Protein, Urine: NEGATIVE
Urine Glucose: NEGATIVE
Urobilinogen, UA: 0.2 (ref 0.0–1.0)
pH: 6 (ref 5.0–8.0)

## 2021-06-20 DIAGNOSIS — C61 Malignant neoplasm of prostate: Secondary | ICD-10-CM | POA: Diagnosis not present

## 2021-06-22 ENCOUNTER — Telehealth: Payer: Self-pay

## 2021-06-22 NOTE — Progress Notes (Signed)
    Chronic Care Management Pharmacy Assistant   Name: [name]  MRN: [id number] DOB: 1935/03/02  Reason for Encounter: Medication Review/Medication Coordination Call.   Recent office visits:  05/29/2021 Dr.Kremer MD (PCP) No Medication Changes noted  Recent consult visits:  No recent [facility] visits:  None in previous 6 months  Medications: Outpatient Encounter Medications as of 06/22/2021  Medication Sig   aspirin 81 MG tablet Take 81 mg by mouth daily.   Cholecalciferol (VITAMIN D3) 50 MCG (2000 UT) TABS Take 2,000 Units by mouth daily.   cycloSPORINE (RESTASIS) 0.05 % ophthalmic emulsion 1 drop 2 (two) times daily.    donepezil (ARICEPT) 10 MG tablet Take 1 tablet (10 mg total) by mouth at bedtime.   finasteride (PROSCAR) 5 MG tablet Take 5 mg by mouth daily.   latanoprost (XALATAN) 0.005 % ophthalmic solution 1 drop at bedtime.   mirtazapine (REMERON) 45 MG tablet TAKE ONE TABLET BY MOUTH EVERYDAY AT BEDTIME   Multiple Vitamin (MULTIVITAMIN) capsule Take 1 capsule by mouth daily.   pantoprazole (PROTONIX) 40 MG tablet Take 1 tablet (40 mg total) by mouth daily.   valACYclovir (VALTREX) 500 MG tablet Take 500 mg by mouth as needed.    No facility-administered encounter medications on file as of 06/22/2021.   Care Gaps: Shinfrix Vaccine COVID-19 Vaccine Influenza Vaccine Star Rating Drugs: None ID Medication Fill Gaps: latanoprost 0.005 % ophthalmic solution last filled 12/04/2020 25 day supply  Reviewed chart for medication changes ahead of medication coordination call.    BP Readings from Last 3 Encounters:  05/29/21 130/80  02/06/21 126/70  09/22/20 122/84    No results found for: HGBA1C   Patient obtains medications through Adherence Packaging  30 Days   Last adherence delivery included:  Aspirin 81 mg Daily - bedtime Vitamin D3 50 MCG Daily- Breakfast Donepezil 10 mg tablet Daily -Bedtime Finasteride 5 mg tablet  Daily-Bedtime Mirtazapine 45 mg tablet Daily-Bedtime Pantoprazole 40 mg tablet Daily- Breakfast Multivitamin Capsule Daily-Breakfast  Patient declined medication last month Latanoprost 0.005% Eye Drops -one drop in each eye at bedtime-Adequate supply Valacyclovir 1000 mg tablet- PRN Adequate supply Restasis 0.05% two times Daily-Adequate supply  Patient is due for next adherence delivery on: 07/03/2021. Called patient and reviewed medications and coordinated delivery.  This delivery to include: Aspirin 81 mg Daily - bedtime Vitamin D3 50 MCG Daily- Breakfast Donepezil 10 mg tablet Daily -Bedtime Finasteride 5 mg tablet Daily-Bedtime Mirtazapine 45 mg tablet Daily-Bedtime Pantoprazole 40 mg tablet Daily- Breakfast Multivitamin Capsule Daily-Breakfast   Patient declined the following medications  Latanoprost 0.005% Eye Drops -one drop in each eye at bedtime-Adequate supply Valacyclovir 1000 mg tablet- PRN Adequate supply Restasis 0.05% two times Daily-Adequate supply Patient needs refills for None ID.  Confirmed delivery date of 07/03/2021, advised patient that pharmacy will contact them the morning of delivery.  [place] Pharmacist Assistant [phone]

## 2021-06-25 ENCOUNTER — Telehealth: Payer: Self-pay

## 2021-06-25 NOTE — Progress Notes (Signed)
Spoke to patient to confirmed patient telephone appointment on 06/26/2021 for CCM at 10:00 am with Junius Argyle the Clinical pharmacist.   Patient Verbalized understanding and denies any side effects from any of his current medications  [place] Pharmacist Assistant [phone]

## 2021-06-26 ENCOUNTER — Ambulatory Visit (INDEPENDENT_AMBULATORY_CARE_PROVIDER_SITE_OTHER): Payer: PPO

## 2021-06-26 DIAGNOSIS — G4700 Insomnia, unspecified: Secondary | ICD-10-CM

## 2021-06-26 DIAGNOSIS — Z8739 Personal history of other diseases of the musculoskeletal system and connective tissue: Secondary | ICD-10-CM

## 2021-06-26 DIAGNOSIS — Z8673 Personal history of transient ischemic attack (TIA), and cerebral infarction without residual deficits: Secondary | ICD-10-CM

## 2021-06-26 DIAGNOSIS — K219 Gastro-esophageal reflux disease without esophagitis: Secondary | ICD-10-CM

## 2021-06-26 DIAGNOSIS — G3184 Mild cognitive impairment, so stated: Secondary | ICD-10-CM

## 2021-06-26 DIAGNOSIS — N400 Enlarged prostate without lower urinary tract symptoms: Secondary | ICD-10-CM | POA: Diagnosis not present

## 2021-06-26 NOTE — Progress Notes (Signed)
Chronic Care Management Pharmacy Note  06/26/2021 Name:  [name] MRN:  [id number] DOB:  April 10, 1935  Summary: Patient presents for CCM follow-up. He continues to utilize [employer], which has been working well for him. He denies any concerns or side effects from his medications.  Recommendations/Changes made from today's visit: Continue current medications  Plan: CPP follow-up 1 year    Subjective: [name] is an 86 y.o. year old male who is a primary patient of Ethelene Hal, Mortimer Fries, MD.  The CCM team was consulted for assistance with disease management and care coordination needs.    Engaged with patient by telephone for follow up visit in response to provider referral for pharmacy case management and/or care coordination services.   Consent to Services:  The patient was given information about Chronic Care Management services, agreed to services, and gave verbal consent prior to initiation of services.  Please see initial visit note for detailed documentation.   Patient Care Team: Libby Maw, MD as PCP - General (Family Medicine) Melissa Montane, MD as Consulting Physician (Otolaryngology) Dohmeier, Asencion Partridge, MD as Consulting Physician (Neurology) Dillingham, Loel Lofty, DO as Attending Physician (Plastic Surgery) Warden Fillers, MD as Consulting Physician (Ophthalmology) Franchot Gallo, MD as Consulting Physician (Urology) Kathrynn Ducking, MD as Consulting Physician (Neurology) Germaine Pomfret, [facility] as Pharmacist (Pharmacist)  Recent office visits: 05/29/21: Patient presented to Dr. Ethelene Hal for follow-up.  Recent consult visits: None in previous 6 months  Hospital visits: None in previous 6 months   Objective:  Lab Results  Component Value Date   CREATININE 0.90 05/29/2021   BUN 16 05/29/2021   GFR 78.72 05/29/2021   GFRNONAA 79 07/28/2017   GFRAA 92 07/28/2017   NA 140 05/29/2021   K 5.2 (H) 05/29/2021    CALCIUM 10.6 (H) 05/29/2021   CO2 29 05/29/2021   GLUCOSE 65 (L) 05/29/2021    Lab Results  Component Value Date/Time   GFR 78.72 05/29/2021 02:47 PM   GFR 75.54 08/14/2020 03:38 PM    Last diabetic Eye exam: No results found for: HMDIABEYEEXA  Last diabetic Foot exam: No results found for: HMDIABFOOTEX   Lab Results  Component Value Date   CHOL 174 07/29/2019   HDL 48.10 07/29/2019   LDLCALC 111 (H) 07/29/2019   LDLDIRECT 112.0 07/29/2019   TRIG 77.0 07/29/2019   CHOLHDL 4 07/29/2019    Hepatic Function Latest Ref Rng & Units 07/29/2019 07/30/2018 07/28/2017  Total Protein 6.0 - 8.3 g/dL 6.7 7.1 7.0  Albumin 3.5 - 5.2 g/dL 4.4 4.6 4.7  AST 0 - 37 U/L _0 ALT 0 - 53 U/L _1 Alk Phosphatase 39 - 117 U/L 81 72 86  Total Bilirubin 0.2 - 1.2 mg/dL 0.7 0.6 0.5    Lab Results  Component Value Date/Time   TSH 2.340 07/28/2017 03:04 PM   TSH 1.92 07/24/2016 02:32 PM    CBC Latest Ref Rng & Units 05/29/2021 02/07/2020 07/29/2019  WBC 4.0 - 10.5 K/uL 8.5 7.5 6.9  Hemoglobin 13.0 - 17.0 g/dL 14.2 13.9 14.2  Hematocrit 39.0 - 52.0 % 42.7 40.7 42.7  Platelets 150.0 - 400.0 K/uL 294.0 268.0 269.0    Lab Results  Component Value Date/Time   VD25OH 36.3 07/28/2017 03:04 PM   VD25OH 35 07/24/2016 02:32 PM    Clinical ASCVD: Yes  The ASCVD Risk score Mikey Bussing DC Jr., et al., 2013) failed to calculate for the following reasons:  The 2013 ASCVD risk score is only valid for ages 107 to 60    Depression screen PHQ 2/9 02/06/2021 02/07/2020 02/02/2020  Decreased Interest 0 0 0  Down, Depressed, Hopeless 0 0 0  PHQ - 2 Score 0 0 0  Altered sleeping - 2 -  Tired, decreased energy - 0 -  Change in appetite - 0 -  Feeling bad or failure about yourself  - 0 -  Trouble concentrating - 1 -  Moving slowly or fidgety/restless - 0 -  Suicidal thoughts - 0 -  PHQ-9 Score - 3 -  Difficult doing work/chores - Not difficult at all -    Social History   Tobacco Use  Smoking Status  Former   Types: Cigarettes   Quit date: 07/23/1962   Years since quitting: 58.9  Smokeless Tobacco Never  Tobacco Comments   Stopped in 1963   BP Readings from Last 3 Encounters:  05/29/21 130/80  02/06/21 126/70  09/22/20 122/84   Pulse Readings from Last 3 Encounters:  05/29/21 78  02/06/21 94  09/22/20 79   Wt Readings from Last 3 Encounters:  05/29/21 142 lb 3.2 oz (64.5 kg)  02/06/21 146 lb 6.4 oz (66.4 kg)  09/22/20 147 lb 12.8 oz (67 kg)   BMI Readings from Last 3 Encounters:  05/29/21 22.95 kg/m  02/06/21 23.63 kg/m  09/22/20 23.86 kg/m    Assessment/Interventions: Review of patient past medical history, allergies, medications, health status, including review of consultants reports, laboratory and other test data, was performed as part of comprehensive evaluation and provision of chronic care management services.   SDOH:  (Social Determinants of Health) assessments and interventions performed: Yes  SDOH Screenings   Alcohol Screen: Low Risk    Last Alcohol Screening Score (AUDIT): 1  Depression (PHQ2-9): Low Risk    PHQ-2 Score: 0  Financial Resource Strain: Low Risk    Difficulty of Paying Living Expenses: Not hard at all  Food Insecurity: No Food Insecurity   Worried About Charity fundraiser in the Last Year: Never true   Ran Out of Food in the Last Year: Never true  Housing: Low Risk    Last Housing Risk Score: 0  Physical Activity: Sufficiently Active   Days of Exercise per Week: 3 days   Minutes of Exercise per Session: 60 min  Social Connections: Socially Isolated   Frequency of Communication with Friends and Family: More than three times a week   Frequency of Social Gatherings with Friends and Family: More than three times a week   Attends Religious Services: Never   Marine scientist or Organizations: No   Attends Music therapist: Never   Marital Status: Never married  Stress: No Stress Concern Present   Feeling of Stress  : Not at all  Tobacco Use: Medium Risk   Smoking Tobacco Use: Former   Smokeless Tobacco Use: Never  Transportation Needs: No Data processing manager (Medical): No   Lack of Transportation (Non-Medical): No    CCM Care Plan  Allergies  Allergen Reactions   Sulfamethoxazole Other (See Comments)    Constipation   Sulfamethoxazole-Trimethoprim     constipation    Medications Reviewed Today     Reviewed by Libby Maw, MD (Physician) on 05/29/21 at [place] List Status: <None>   Medication Order Taking? Sig Documenting Provider Last Dose Status Informant  aspirin 81 MG tablet [id number] Yes Take 81 mg by mouth  daily. [provider] Taking Active   Cholecalciferol (VITAMIN D3) 50 MCG (2000 UT) TABS [id number] Yes Take 2,000 Units by mouth daily. [provider] Taking Active   cycloSPORINE (RESTASIS) 0.05 % ophthalmic emulsion [id number] Yes 1 drop 2 (two) times daily.  [provider] Taking Active   donepezil (ARICEPT) 10 MG tablet [id number] Yes Take 1 tablet (10 mg total) by mouth at bedtime. Dohmeier, Asencion Partridge, MD Taking Active   finasteride (PROSCAR) 5 MG tablet [id number] Yes Take 5 mg by mouth daily. [provider] Taking Active   latanoprost (XALATAN) 0.005 % ophthalmic solution [id number] Yes 1 drop at bedtime. [provider] Taking Active   mirtazapine (REMERON) 45 MG tablet [id number] Yes TAKE ONE TABLET BY MOUTH EVERYDAY AT BEDTIME Libby Maw, MD Taking Active   Multiple Vitamin (MULTIVITAMIN) capsule [id number] Yes Take 1 capsule by mouth daily. [provider] Taking Active            Med Note Hardie Pulley, AMMIE J   Fri Sep 22, 2020  9:15 AM)    pantoprazole (PROTONIX) 40 MG tablet [id number] Yes Take 1 tablet (40 mg total) by mouth daily. Ladene Artist, MD Taking Active   valACYclovir (VALTREX) 500 MG tablet [id number] Yes Take 500 mg by mouth as needed.  [provider]  Taking Active             Patient Active Problem List   Diagnosis Date Noted   Prostate cancer ([place]) 08/31/2020   Vitiligo 08/08/2020   History of gout 08/08/2020   Dysthymia 02/07/2020   OSA treated with BiPAP 01/19/2020   Need for influenza vaccination 08/25/2019   Elevated LDL cholesterol level 07/29/2019   Healthcare maintenance 07/29/2018   Gout 07/29/2018   Benign skin lesion of thoracic region 07/29/2018   Obstructive sleep apnea treated with bilevel positive airway pressure (BiPAP) 05/01/2017   Hypersomnia, persistent 11/24/2013   Sebaceous cyst 07/29/2013   Insomnia, persistent 07/22/2013   Abnormal chest percussion 07/22/2013   Memory change 04/19/2013   Subjective vision disturbance 04/19/2013   Other symptoms involving digestive system(787.99) 11/13/2011   Loss of weight 11/13/2011   Anal or rectal pain 11/13/2011   Personal history of colonic polyps 11/13/2011   HSV 02/16/2010   DRY EYE SYNDROME 02/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   ESOPHAGEAL STRICTURE 02/16/2010   Gastroesophageal reflux disease 02/16/2010    Immunization History  Administered Date(s) Administered   Fluad Quad(high Dose 65+) 07/29/2019, 08/14/2020   Influenza, Seasonal, Injecte, Preservative Fre 10/14/2012   Influenza,inj,Quad PF,6+ Mos 09/15/2015, 07/24/2016, 07/28/2017   Influenza-Unspecified 10/04/2013, 07/29/2018   PFIZER(Purple Top)SARS-COV-2 Vaccination 11/29/2019, 12/20/2019   Pneumococcal Conjugate-13 04/18/2015   Pneumococcal Polysaccharide-23 04/06/2013   Td 02/03/2008   Zoster Recombinat (Shingrix) 06/10/2018    Conditions to be addressed/monitored:  GERD, BPH, and Insomnia and Memory Loss and History of TIA  Care Plan : General Pharmacy (Adult)  Updates made by Germaine Pomfret, RPH since 06/26/2021 12:00 AM     Problem: GERD, BPH, and Insomnia and Memory Loss and History of TIA   Priority: High     Long-Range Goal: Patient-Specific Goal   Start Date: 06/26/2021  Expected End Date:  06/26/2022  This Visit's Progress: On track  Priority: High  Note:   Current Barriers:  No barriers noted  Pharmacist Clinical Goal(s):  Patient will verbalize ability to afford treatment regimen through collaboration with PharmD and provider.   Interventions: 1:1 collaboration with Libby Maw, MD regarding development and update of comprehensive  plan of care as evidenced by provider attestation and co-signature Inter-disciplinary care team collaboration (see longitudinal plan of care) Comprehensive medication review performed; medication list updated in electronic medical record  GERD (Goal: Prevent heartburn symptoms) -Controlled -History of Barrett's esophagus  -Current treatment  Pantoprazole 40 mg daily  -Medications previously tried: NA  -Recommended to continue current medication  Gout (Goal: Prevent gout flares) -Controlled -Last Gout Flare: >1 year prior -Current treatment  None -Medications previously tried: NA  -We discussed:  Counseled patient on low purine diet plan. Counseled patient to reduce consumption of high-fructose corn syrup, sweetened soft drinks, fruit juices, meat, and seafood. -Recommended to continue current medication  BPH (Goal: Minimize Urinary Symptoms) -Controlled -History of Prostate Cancer -Current treatment  Finasteride 5 mg daily  -Medications previously tried: NA  -Recommended to continue current medication  Insomnia (Goal: Maintain sleep quality) -Controlled -Current treatment  Mirtazapine 45 mg nightly -Medications previously tried: NA  -Recommended to continue current medication  Mild Cognitive Impairment (Goal: Maintain memory) -Controlled -Current treatment  Donepezil 10 mg daily  -Medications previously tried: NA  -Recommended to continue current medication  History of Stroke (Goal: Prevent strokes) -Controlled -TIA 2010 -Current treatment  Aspirin 81 mg daily  -Medications previously tried: NA -Denies  bruising/bleeding.  -Recommended to continue current medication  Patient Goals/Self-Care Activities Patient will:  - take medications as prescribed  Follow Up Plan: Telephone follow up appointment with care management team member scheduled for:  06/20/22 at 11:00 AM      Medication Assistance: None required.  Patient affirms current coverage meets needs.  Compliance/Adherence/Medication fill history: Care Gaps: Shingrix Covid Vaccine Influenza  Star-Rating Drugs: None noted   Patient's preferred pharmacy is:  [facility] - [place], [place] - [address] [place] [address] Phone: [phone] Fax: [phone]  Patient decided to: Utilize [facility] for medication synchronization, packaging and delivery  Care Plan and Follow Up Patient Decision:  Patient agrees to Care Plan and Follow-up.  Plan: Telephone follow up appointment with care management team member scheduled for:  06/20/22 at 11:00 AM  Junius Argyle, PharmD, Para [place], CPP Clinical Pharmacist [facility]  [phone]

## 2021-06-27 DIAGNOSIS — R350 Frequency of micturition: Secondary | ICD-10-CM | POA: Diagnosis not present

## 2021-06-27 DIAGNOSIS — N401 Enlarged prostate with lower urinary tract symptoms: Secondary | ICD-10-CM | POA: Diagnosis not present

## 2021-06-27 DIAGNOSIS — N5201 Erectile dysfunction due to arterial insufficiency: Secondary | ICD-10-CM | POA: Diagnosis not present

## 2021-06-27 DIAGNOSIS — C61 Malignant neoplasm of prostate: Secondary | ICD-10-CM | POA: Diagnosis not present

## 2021-07-23 ENCOUNTER — Telehealth: Payer: Self-pay

## 2021-07-23 NOTE — Progress Notes (Signed)
    Chronic Care Management Pharmacy Assistant   Name: [name]  MRN: [id number] DOB: November 12, 1934  Reason for Encounter: Medication Review/Medication Coordination Call.   Recent office visits:  No recent Office Visit  Recent consult visits:  No recent Consult visit  Hospital visits:  None in previous 6 months  Medications: Outpatient Encounter Medications as of 07/23/2021  Medication Sig   aspirin 81 MG tablet Take 81 mg by mouth daily.   Cholecalciferol (VITAMIN D3) 50 MCG (2000 UT) TABS Take 2,000 Units by mouth daily.   cycloSPORINE (RESTASIS) 0.05 % ophthalmic emulsion 1 drop 2 (two) times daily.    donepezil (ARICEPT) 10 MG tablet Take 1 tablet (10 mg total) by mouth at bedtime.   finasteride (PROSCAR) 5 MG tablet Take 5 mg by mouth daily.   latanoprost (XALATAN) 0.005 % ophthalmic solution 1 drop at bedtime.   mirtazapine (REMERON) 45 MG tablet TAKE ONE TABLET BY MOUTH EVERYDAY AT BEDTIME   Multiple Vitamin (MULTIVITAMIN) capsule Take 1 capsule by mouth daily.   pantoprazole (PROTONIX) 40 MG tablet Take 1 tablet (40 mg total) by mouth daily.   valACYclovir (VALTREX) 500 MG tablet Take 500 mg by mouth as needed.    No facility-administered encounter medications on file as of 07/23/2021.    Care Gaps: Shinfrix Vaccine COVID-19 Vaccine Influenza Vaccine Star Rating Drugs: None ID Medication Fill Gaps: latanoprost 0.005 % ophthalmic solution last filled 12/04/2020 25 day supply  Reviewed chart for medication changes ahead of medication coordination call.  BP Readings from Last 3 Encounters:  05/29/21 130/80  02/06/21 126/70  09/22/20 122/84    No results found for: HGBA1C   Patient obtains medications through Adherence Packaging  30 Days   Last adherence delivery included: Aspirin 81 mg Daily - bedtime Vitamin D3 50 MCG Daily- Breakfast Donepezil 10 mg tablet Daily -Bedtime Finasteride 5 mg tablet Daily-Bedtime Mirtazapine 45 mg tablet  Daily-Bedtime Pantoprazole 40 mg tablet Daily- Breakfast Multivitamin Capsule Daily-Breakfast  Patient declined medication last month  Latanoprost 0.005% Eye Drops -one drop in each eye at bedtime-Adequate supply Valacyclovir 1000 mg tablet- PRN Adequate supply Restasis 0.05% two times Daily-Adequate supply  Patient is due for next adherence delivery on: 07/27/2021. Called patient and reviewed medications and coordinated delivery.  This delivery to include: Aspirin 81 mg Daily - bedtime Vitamin D3 50 MCG Daily- Breakfast Donepezil 10 mg tablet Daily -Bedtime Finasteride 5 mg tablet Daily-Bedtime Mirtazapine 45 mg tablet Daily-Bedtime Pantoprazole 40 mg tablet Daily- Breakfast Multivitamin Capsule Daily-Breakfast   Patient declined the following medications  Latanoprost 0.005% Eye Drops -one drop in each eye at bedtime-Adequate supply Valacyclovir 1000 mg tablet- PRN Adequate supply Restasis 0.05% two times Daily-Adequate supply  Patient needs refills for None.  Confirmed delivery date of 08/03/2021, advised patient that pharmacy will contact them the morning of delivery.  [place] Pharmacist Assistant [phone]

## 2021-08-16 ENCOUNTER — Telehealth: Payer: Self-pay | Admitting: Family Medicine

## 2021-08-16 ENCOUNTER — Telehealth: Payer: Self-pay

## 2021-08-16 ENCOUNTER — Telehealth: Payer: Self-pay | Admitting: Neurology

## 2021-08-16 MED ORDER — DONEPEZIL HCL 10 MG PO TABS: 10.0000 mg | ORAL_TABLET | Freq: Every day | ORAL | 0 refills | Status: DC

## 2021-08-16 NOTE — Telephone Encounter (Signed)
Refill has been sent for the patient. He has an upcoming apt for November. Must keep this apt for future refills.

## 2021-08-16 NOTE — Telephone Encounter (Signed)
[facility]) request refill for donepezil (ARICEPT) 10 MG tablet at [place]

## 2021-08-16 NOTE — Telephone Encounter (Signed)
[facility] pharmacy called to request refill on mirtazapine (REMERON) 45 MG tablet

## 2021-08-16 NOTE — Progress Notes (Signed)
    Chronic Care Management Pharmacy Assistant   Name: [name]  MRN: [id number] DOB: 1935-02-25  Reason for Encounter: Medication Review/Medication Coordination Call.  Recent office visits:  No recent Office Visit   Recent consult visits:  No recent Consult visit  Hospital visits:  None in previous 6 months  Medications: Outpatient Encounter Medications as of 08/16/2021  Medication Sig   aspirin 81 MG tablet Take 81 mg by mouth daily.   Cholecalciferol (VITAMIN D3) 50 MCG (2000 UT) TABS Take 2,000 Units by mouth daily.   cycloSPORINE (RESTASIS) 0.05 % ophthalmic emulsion 1 drop 2 (two) times daily.    donepezil (ARICEPT) 10 MG tablet Take 1 tablet (10 mg total) by mouth at bedtime.   finasteride (PROSCAR) 5 MG tablet Take 5 mg by mouth daily.   latanoprost (XALATAN) 0.005 % ophthalmic solution 1 drop at bedtime.   mirtazapine (REMERON) 45 MG tablet TAKE ONE TABLET BY MOUTH EVERYDAY AT BEDTIME   Multiple Vitamin (MULTIVITAMIN) capsule Take 1 capsule by mouth daily.   pantoprazole (PROTONIX) 40 MG tablet Take 1 tablet (40 mg total) by mouth daily.   valACYclovir (VALTREX) 500 MG tablet Take 500 mg by mouth as needed.    No facility-administered encounter medications on file as of 08/16/2021.   Care Gaps: Shinfrix Vaccine COVID-19 Vaccine (3- Pfizer risk series) Influenza Vaccine Star Rating Drugs: None ID Medication Fill Gaps: latanoprost 0.005 % ophthalmic solution last filled 12/04/2020 25 day supply  Reviewed chart for medication changes ahead of medication coordination call.   BP Readings from Last 3 Encounters:  05/29/21 130/80  02/06/21 126/70  09/22/20 122/84    No results found for: HGBA1C   Patient obtains medications through Adherence Packaging  30 Days   Last adherence delivery included:  Aspirin 81 mg Daily - bedtime Vitamin D3 50 MCG Daily- Breakfast Donepezil 10 mg tablet Daily -Bedtime Finasteride 5 mg tablet Daily-Bedtime Mirtazapine  45 mg tablet Daily-Bedtime Pantoprazole 40 mg tablet Daily- Breakfast Multivitamin Capsule Daily-Breakfast  Patient declined medications last month: Latanoprost 0.005% Eye Drops -one drop in each eye at bedtime-Adequate supply Valacyclovir 1000 mg tablet- PRN Adequate supply Restasis 0.05% two times Daily-Adequate supply  Patient is due for next adherence delivery on: 08/28/2021. Called patient and reviewed medications and coordinated delivery.  This delivery to include: Aspirin 81 mg Daily - bedtime Vitamin D3 50 MCG Daily- Breakfast Donepezil 10 mg tablet Daily -Bedtime Finasteride 5 mg tablet Daily-Bedtime Mirtazapine 45 mg tablet Daily-Bedtime Pantoprazole 40 mg tablet Daily- Breakfast Multivitamin Capsule Daily-Breakfast   Patient declined the following medications: Latanoprost 0.005% Eye Drops -one drop in each eye at bedtime-Adequate supply Valacyclovir 1000 mg tablet- PRN Adequate supply Restasis 0.05% two times Daily-Adequate supply  Patient needs refills for Donepezil, I reach out to patient Neurology office to [facility].  Patient needs refills for Mirtazapine 45 mg, I reach out to Patient PCP office to request refill to go to [facility] on 08/16/2021.  Confirmed delivery date of 08/28/2021, advised patient that pharmacy will contact them the morning of delivery.  Telephone follow up appointment with care management team member scheduled for:  06/20/22 at 11:00 AM  Bessie Hodgkins Pharmacist Assistant [phone]

## 2021-08-16 NOTE — Telephone Encounter (Signed)
Called and spoke to patient. Patient explained he did not need a refill on mirtazapine and confimed [facility]

## 2021-08-21 ENCOUNTER — Other Ambulatory Visit: Payer: Self-pay | Admitting: Family Medicine

## 2021-08-21 DIAGNOSIS — F341 Dysthymic disorder: Secondary | ICD-10-CM

## 2021-08-21 DIAGNOSIS — G4700 Insomnia, unspecified: Secondary | ICD-10-CM

## 2021-09-04 ENCOUNTER — Ambulatory Visit: Payer: PPO | Admitting: Adult Health

## 2021-09-11 ENCOUNTER — Ambulatory Visit: Payer: PPO | Admitting: Adult Health

## 2021-09-18 ENCOUNTER — Telehealth: Payer: Self-pay

## 2021-09-18 NOTE — Progress Notes (Signed)
    Chronic Care Management Pharmacy Assistant   Name: [name]  MRN: [id number] DOB: 10-17-1935  Reason for Encounter: Medication Review/Medication Coordination Call.   Recent office visits:  No recent office visit  Recent consult visits:  No recent [facility] visits:  None in previous 6 months  Medications: Outpatient Encounter Medications as of 09/18/2021  Medication Sig   aspirin 81 MG tablet Take 81 mg by mouth daily.   Cholecalciferol (VITAMIN D3) 50 MCG (2000 UT) TABS Take 2,000 Units by mouth daily.   cycloSPORINE (RESTASIS) 0.05 % ophthalmic emulsion 1 drop 2 (two) times daily.    donepezil (ARICEPT) 10 MG tablet Take 1 tablet (10 mg total) by mouth at bedtime.   finasteride (PROSCAR) 5 MG tablet Take 5 mg by mouth daily.   latanoprost (XALATAN) 0.005 % ophthalmic solution 1 drop at bedtime.   mirtazapine (REMERON) 45 MG tablet TAKE ONE TABLET BY MOUTH EVERYDAY AT BEDTIME   Multiple Vitamin (MULTIVITAMIN) capsule Take 1 capsule by mouth daily.   pantoprazole (PROTONIX) 40 MG tablet Take 1 tablet (40 mg total) by mouth daily.   valACYclovir (VALTREX) 500 MG tablet Take 500 mg by mouth as needed.    No facility-administered encounter medications on file as of 09/18/2021.   Care Gaps: Shinfrix Vaccine COVID-19 Vaccine (3- Pfizer risk series) Influenza Vaccine Star Rating Drugs: None ID Medication Fill Gaps: latanoprost 0.005 % ophthalmic solution last filled 12/04/2020 25 day supply   Reviewed chart for medication changes ahead of medication coordination call.  BP Readings from Last 3 Encounters:  05/29/21 130/80  02/06/21 126/70  09/22/20 122/84    No results found for: HGBA1C   Patient obtains medications through Adherence Packaging  30 Days   Last adherence delivery included:  Aspirin 81 mg Daily - bedtime Vitamin D3 50 MCG Daily- Breakfast Donepezil 10 mg tablet Daily -Bedtime Finasteride 5 mg tablet Daily-Bedtime Mirtazapine  45 mg tablet Daily-Bedtime Pantoprazole 40 mg tablet Daily- Breakfast Multivitamin Capsule Daily-Breakfast  Patient declined medications last month  Latanoprost 0.005% Eye Drops -one drop in each eye at bedtime-Adequate supply Valacyclovir 1000 mg tablet- PRN Adequate supply Restasis 0.05% two times Daily-Adequate supply  Patient is due for next adherence delivery on: 09/28/2021. Called patient and reviewed medications and coordinated delivery.  This delivery to include: Aspirin 81 mg Daily - bedtime Vitamin D3 50 MCG Daily- Breakfast Donepezil 10 mg tablet Daily -Bedtime Finasteride 5 mg tablet Daily-Bedtime Mirtazapine 45 mg tablet Daily-Bedtime Pantoprazole 40 mg tablet Daily- Breakfast Multivitamin Capsule Daily-Breakfast  Patient declined the following medications Latanoprost 0.005% Eye Drops -one drop in each eye at bedtime-Adequate supply Valacyclovir 1000 mg tablet- PRN Adequate supply Restasis 0.05% two times Daily-Adequate supply  Patient needs refills for None ID.  Confirmed delivery date of 09/28/2021, advised patient that pharmacy will contact them the morning of delivery.  [place] Pharmacist Assistant [phone]

## 2021-09-25 DIAGNOSIS — G4733 Obstructive sleep apnea (adult) (pediatric): Secondary | ICD-10-CM | POA: Diagnosis not present

## 2021-10-22 ENCOUNTER — Other Ambulatory Visit: Payer: Self-pay | Admitting: Gastroenterology

## 2021-10-22 DIAGNOSIS — K219 Gastro-esophageal reflux disease without esophagitis: Secondary | ICD-10-CM

## 2021-11-16 ENCOUNTER — Telehealth: Payer: Self-pay

## 2021-11-16 ENCOUNTER — Telehealth: Payer: Self-pay | Admitting: Neurology

## 2021-11-16 NOTE — Telephone Encounter (Signed)
[facility] called requesting a refill on pt's donepezil (ARICEPT) 10 MG tablet

## 2021-11-16 NOTE — Progress Notes (Signed)
° ° °  Chronic Care Management Pharmacy Assistant   Name: [name]  MRN: [id number] DOB: 08/09/1935  Reason for Encounter: Medication Review/Medication Coordination Call.   Recent office visits:  No recent office visit  Recent consult visits:  No recent consult Visit  Hospital visits:  None in previous 6 months  Medications: Outpatient Encounter Medications as of 11/16/2021  Medication Sig   aspirin 81 MG tablet Take 81 mg by mouth daily.   Cholecalciferol (VITAMIN D3) 50 MCG (2000 UT) TABS Take 2,000 Units by mouth daily.   cycloSPORINE (RESTASIS) 0.05 % ophthalmic emulsion 1 drop 2 (two) times daily.    donepezil (ARICEPT) 10 MG tablet Take 1 tablet (10 mg total) by mouth at bedtime.   finasteride (PROSCAR) 5 MG tablet Take 5 mg by mouth daily.   latanoprost (XALATAN) 0.005 % ophthalmic solution 1 drop at bedtime.   mirtazapine (REMERON) 45 MG tablet TAKE ONE TABLET BY MOUTH EVERYDAY AT BEDTIME   Multiple Vitamin (MULTIVITAMIN) capsule Take 1 capsule by mouth daily.   pantoprazole (PROTONIX) 40 MG tablet Take 1 tablet (40 mg total) by mouth every morning. Please schedule a yearly follow up for further refills. Thank you   valACYclovir (VALTREX) 500 MG tablet Take 500 mg by mouth as needed.    No facility-administered encounter medications on file as of 11/16/2021.    Care Gaps: Shinfrix Vaccine COVID-19 Vaccine (3- Pfizer risk series) Influenza Vaccine Star Rating Drugs: None ID Medication Fill Gaps: latanoprost 0.005 % ophthalmic solution last filled 12/04/2020 25 day supply  Reviewed chart for medication changes ahead of medication coordination call.   BP Readings from Last 3 Encounters:  05/29/21 130/80  02/06/21 126/70  09/22/20 122/84    No results found for: HGBA1C   Patient obtains medications through Adherence Packaging  30 Days   Last adherence delivery included: Aspirin 81 mg Daily - bedtime Vitamin D3 50 MCG Daily- Breakfast Donepezil 10 mg  tablet Daily -Bedtime Finasteride 5 mg tablet Daily-Bedtime Mirtazapine 45 mg tablet Daily-Bedtime Pantoprazole 40 mg tablet Daily- Breakfast Multivitamin Capsule Daily-Breakfast  Patient declined medications last month: Latanoprost 0.005% Eye Drops -one drop in each eye at bedtime-Adequate supply Valacyclovir 1000 mg tablet- PRN Adequate supply Restasis 0.05% two times Daily-Adequate supply  Patient is due for next adherence delivery on: 11/28/2021. Called patient and reviewed medications and coordinated delivery.  This delivery to include: Aspirin 81 mg Daily - bedtime Vitamin D3 50 MCG Daily- Breakfast Donepezil 10 mg tablet Daily -Bedtime Finasteride 5 mg tablet Daily-Bedtime Mirtazapine 45 mg tablet Daily-Bedtime Pantoprazole 40 mg tablet Daily- Breakfast Multivitamin Capsule Daily-Breakfast Valacyclovir 500 mg -PRN (Vials)    Patient declined the following medications: Latanoprost 0.005% Eye Drops -one drop in each eye at bedtime-Adequate supply Restasis 0.05% two times Daily-Adequate supply  Patient needs refills for Mirtazapine,donepezil.I reach out to Neurology to request a refill for done[facility]il [facility]2021.  Confirmed delivery date of 11/28/2021, advised patient that pharmacy will contact them the morning of delivery.  Telephone follow up appointment with Care management team member scheduled for : 06/20/2022 at 11:00 am.  [place] Pharmacist Assistant [phone]

## 2021-11-19 MED ORDER — DONEPEZIL HCL 10 MG PO TABS: 10.0000 mg | ORAL_TABLET | Freq: Every day | ORAL | 0 refills | Status: DC

## 2021-11-19 NOTE — Telephone Encounter (Signed)
Good morning, can pt please be scheduled. Pt f/u is overdue and is needing appt for continued refills. Thank you.

## 2021-11-21 ENCOUNTER — Other Ambulatory Visit: Payer: Self-pay | Admitting: Family Medicine

## 2021-11-21 DIAGNOSIS — G4700 Insomnia, unspecified: Secondary | ICD-10-CM

## 2021-11-21 DIAGNOSIS — F341 Dysthymic disorder: Secondary | ICD-10-CM

## 2021-11-21 NOTE — Telephone Encounter (Signed)
Refill request for: Mirtazapine 45 mg LR 08/25/21, #30, 2 rf LOV 05/29/21 FOV  none scheduled.   Please review and advise.  Thanks.  Dm/cma

## 2021-11-22 ENCOUNTER — Other Ambulatory Visit: Payer: Self-pay | Admitting: Family Medicine

## 2021-11-22 ENCOUNTER — Telehealth: Payer: Self-pay

## 2021-11-22 MED ORDER — VALACYCLOVIR HCL 500 MG PO TABS: 500.0000 mg | ORAL_TABLET | Freq: Two times a day (BID) | ORAL | 4 refills | Status: DC

## 2021-11-22 NOTE — Telephone Encounter (Signed)
Pt scheduled f/u appt  for medication on 02/03/22 at 1:30p

## 2021-11-22 NOTE — Telephone Encounter (Signed)
Please advise message below, okay to refill pending Rx? Last refill 06/27/20 last OV 05/29/21.

## 2021-11-22 NOTE — Progress Notes (Signed)
I reach out to patient PCP per Clinical pharmacist to see if his PCP could refill Valacyclovir, and have the [facility].  Per PCP Office, she will send the request over to Dr. Ethelene Hal for him to advise.  [place] Pharmacist Assistant [phone]

## 2021-11-23 NOTE — Telephone Encounter (Signed)
Requested RX sent in patient aware.

## 2021-12-17 ENCOUNTER — Telehealth: Payer: Self-pay

## 2021-12-17 ENCOUNTER — Telehealth: Payer: Self-pay | Admitting: Neurology

## 2021-12-17 DIAGNOSIS — G3184 Mild cognitive impairment, so stated: Secondary | ICD-10-CM

## 2021-12-17 MED ORDER — DONEPEZIL HCL 10 MG PO TABS
10.0000 mg | ORAL_TABLET | Freq: Every day | ORAL | 2 refills | Status: DC
Start: 2021-12-17 — End: 2022-02-21

## 2021-12-17 NOTE — Progress Notes (Signed)
° ° °  Chronic Care Management Pharmacy Assistant   Name: [name]  MRN: [id number] DOB: 08-Dec-1934  Reason for Encounter: Medication Review/Medication Coordination Call.  Recent office visits:  No recent office visit  Recent consult visits:  No recent consult visit  Hospital visits:  None in previous 6 months  Medications: Outpatient Encounter Medications as of 12/17/2021  Medication Sig   aspirin 81 MG tablet Take 81 mg by mouth daily.   Cholecalciferol (VITAMIN D3) 50 MCG (2000 UT) TABS Take 2,000 Units by mouth daily.   cycloSPORINE (RESTASIS) 0.05 % ophthalmic emulsion 1 drop 2 (two) times daily.    donepezil (ARICEPT) 10 MG tablet Take 1 tablet (10 mg total) by mouth at bedtime. Please call and make overdue appt for further refills   finasteride (PROSCAR) 5 MG tablet Take 5 mg by mouth daily.   latanoprost (XALATAN) 0.005 % ophthalmic solution 1 drop at bedtime.   mirtazapine (REMERON) 45 MG tablet TAKE ONE TABLET BY MOUTH EVERYDAY AT BEDTIME   Multiple Vitamin (MULTIVITAMIN) capsule Take 1 capsule by mouth daily.   pantoprazole (PROTONIX) 40 MG tablet Take 1 tablet (40 mg total) by mouth every morning. Please schedule a yearly follow up for further refills. Thank you   No facility-administered encounter medications on file as of 12/17/2021.    Care Gaps: Shinfrix Vaccine COVID-19 Vaccine (3- Pfizer risk series) Influenza Vaccine Star Rating Drugs: None ID Medication Fill Gaps: latanoprost 0.005 % ophthalmic solution last filled 12/04/2020 25 day supply  Reviewed chart for medication changes ahead of medication coordination call.  BP Readings from Last 3 Encounters:  05/29/21 130/80  02/06/21 126/70  09/22/20 122/84    No results found for: HGBA1C   Patient obtains medications through Adherence Packaging  30 Days   Last adherence delivery included:  Aspirin 81 mg Daily - bedtime Vitamin D3 50 MCG Daily- Breakfast Donepezil 10 mg tablet Daily  -Bedtime Finasteride 5 mg tablet Daily-Bedtime Mirtazapine 45 mg tablet Daily-Bedtime Pantoprazole 40 mg tablet Daily- Breakfast Multivitamin Capsule Daily-Breakfast Valacyclovir 500 mg -PRN (Vials)   Patient declined medications last month: Latanoprost 0.005% Eye Drops -one drop in each eye at bedtime-Adequate supply Restasis 0.05% two times Daily-Adequate supply   Patient is due for next adherence delivery on: 12/27/2021. Called patient and reviewed medications and coordinated delivery.  This delivery to include: Aspirin 81 mg Daily - bedtime Vitamin D3 50 MCG Daily- Breakfast Donepezil 10 mg tablet Daily -Bedtime Finasteride 5 mg tablet Daily-Bedtime Mirtazapine 45 mg tablet Daily-Bedtime Pantoprazole 40 mg tablet Daily- Breakfast Multivitamin Capsule Daily-Breakfast Valacyclovir 500 mg -PRN (Vials- patient is asking for at least 20-30 day supply)    Patient declined the following medications Latanoprost 0.005% Eye Drops -one drop in each eye at bedtime-Adequate supply Restasis 0.05% two times Daily-Adequate supply  Patient needs refills for Pantoprazole - patient is aware he needs to schedule appointment to have further refills.Patient states he will reach out to schedule appointment today.Donepezil - patient is aware he needs to schedule appointment to have further refills.Patient states he has appointment schedule on 02/21/2022.I reach out to Neurology to request a refill for donepezil [facility] 12/17/2021  Confirmed delivery date of 12/27/2021 (Second route), advised patient that pharmacy will contact them the morning of delivery.  Telephone follow up appointment with Care management team member scheduled for : 06/20/2022 at 11:00 am  [place] Pharmacist Assistant [phone]

## 2021-12-17 NOTE — Telephone Encounter (Signed)
[facility]) request refill for donepezil (ARICEPT) 10 MG tablet at [place]

## 2021-12-17 NOTE — Telephone Encounter (Signed)
E-scribed refill 

## 2021-12-20 ENCOUNTER — Other Ambulatory Visit: Payer: Self-pay | Admitting: Gastroenterology

## 2021-12-20 DIAGNOSIS — K219 Gastro-esophageal reflux disease without esophagitis: Secondary | ICD-10-CM

## 2021-12-21 ENCOUNTER — Other Ambulatory Visit: Payer: Self-pay | Admitting: Family Medicine

## 2022-01-01 DIAGNOSIS — H40013 Open angle with borderline findings, low risk, bilateral: Secondary | ICD-10-CM | POA: Diagnosis not present

## 2022-01-01 DIAGNOSIS — H18423 Band keratopathy, bilateral: Secondary | ICD-10-CM | POA: Diagnosis not present

## 2022-01-01 DIAGNOSIS — H16223 Keratoconjunctivitis sicca, not specified as Sjogren's, bilateral: Secondary | ICD-10-CM | POA: Diagnosis not present

## 2022-01-01 DIAGNOSIS — Z961 Presence of intraocular lens: Secondary | ICD-10-CM | POA: Diagnosis not present

## 2022-01-15 ENCOUNTER — Ambulatory Visit: Payer: PPO | Admitting: Gastroenterology

## 2022-01-15 ENCOUNTER — Encounter: Payer: Self-pay | Admitting: Gastroenterology

## 2022-01-15 VITALS — BP 124/62 | HR 66 | Ht 66.0 in | Wt 145.0 lb

## 2022-01-15 DIAGNOSIS — K219 Gastro-esophageal reflux disease without esophagitis: Secondary | ICD-10-CM | POA: Diagnosis not present

## 2022-01-15 DIAGNOSIS — Z86010 Personal history of colonic polyps: Secondary | ICD-10-CM

## 2022-01-15 MED ORDER — PANTOPRAZOLE SODIUM 40 MG PO TBEC: DELAYED_RELEASE_TABLET | ORAL | 11 refills | Status: DC

## 2022-01-15 NOTE — Patient Instructions (Signed)
We have sent the following medications to your pharmacy for you to pick up at your convenience: pantoprazole.   The [place] [facility] providers would like to encourage you to use MYCHART to communicate with providers for non-urgent requests or questions.  Due to long hold times on the telephone, sending your provider a message by MYCHART may be a faster and more efficient way to get a response.  Please allow 48 business hours for a response.  Please remember that this is for non-urgent requests.   Thank you for choosing me and  Gastroenterology.  Malcolm T. Stark, Jr., MD., FACG   

## 2022-01-15 NOTE — Progress Notes (Signed)
? ? ?  History of Present Illness: This is a 87 year old with GERD, LPR and history of an esophageal stricture.  His reflux and LPR symptoms are under very good control.  He notes an occasional mild delay with swallowing solids but does not feel it has progressed or requires repeat endoscopy at this time. ? ?Current Medications, Allergies, Past Medical History, Past Surgical History, Family History and Social History were reviewed in [employer] record. ? ? ?Physical Exam: ?General: Well developed, well nourished, no acute distress ?Head: Normocephalic and atraumatic ?Eyes: Sclerae anicteric, EOMI ?Ears: Normal auditory acuity ?Mouth: Not examined, mask on during Covid-19 pandemic ?Lungs: Clear throughout to auscultation ?Heart: Regular rate and rhythm; no murmurs, rubs or bruits ?Abdomen: Soft, non tender and non distended. No masses, hepatosplenomegaly or hernias noted. Normal Bowel sounds ?Rectal: Not done ?Musculoskeletal: Symmetrical with no gross deformities  ?Pulses:  Normal pulses noted ?Extremities: No clubbing, cyanosis, edema or deformities noted ?Neurological: Alert oriented x 4, grossly nonfocal ?Psychological:  Alert and cooperative. Normal mood and affect ? ? ?Assessment and Recommendations: ? ?GERD with LPR and history of an esophageal stricture.  Mild infrequent solid food dysphagia.  He is advised to contact us if the symptoms progress or if his reflux symptoms are not well controlled. REV in 1 year. 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  His last colonoscopy was performed in 2013 by Dr. Sharlett Iles and showed only diverticulosis.  No plans for future surveillance colonoscopies due to age and absence of polyps on last colonoscopy. ?

## 2022-01-16 ENCOUNTER — Telehealth: Payer: Self-pay

## 2022-01-16 NOTE — Progress Notes (Signed)
? ? ?  Chronic Care Management ?Pharmacy Assistant  ? ?Name: MARKS SCALERA III  MRN: [id number] DOB: 01-29-1935 ? ?Reason for Encounter: Medication Review/Mdication Coordination Call. ?  ?Recent office visits:  ?No Recent office visit ? ?Recent consult visits:  ?01/15/2022 Dr. Fuller Plan MD (Gastroenterology) No Medication Changes noted, Return in about 1 year  ? ?Hospital visits:  ?None in previous 6 months ? ?Medications: ?Outpatient Encounter Medications as of 01/16/2022  ?Medication Sig  ? aspirin 81 MG tablet Take 81 mg by mouth daily.  ? Cholecalciferol (VITAMIN D3) 50 MCG (2000 UT) TABS Take 2,000 Units by mouth daily.  ? cycloSPORINE (RESTASIS) 0.05 % ophthalmic emulsion 1 drop 2 (two) times daily.   ? donepezil (ARICEPT) 10 MG tablet Take 1 tablet (10 mg total) by mouth at bedtime. Must keep follow up 02/21/22 for ongoing refills  ? finasteride (PROSCAR) 5 MG tablet Take 5 mg by mouth daily.  ? latanoprost (XALATAN) 0.005 % ophthalmic solution 1 drop at bedtime.  ? mirtazapine (REMERON) 45 MG tablet TAKE ONE TABLET BY MOUTH EVERYDAY AT BEDTIME  ? Multiple Vitamin (MULTIVITAMIN) capsule Take 1 capsule by mouth daily.  ? pantoprazole (PROTONIX) 40 MG tablet TAKE ONE TABLET BY MOUTH EVERY MORNING Needs appointment for further refills  ? valACYclovir (VALTREX) 500 MG tablet TAKE ONE TABLET BY MOUTH twice daily FOR 3 DAYS AS NEEDED FOR outbreaks  ? ?No facility-administered encounter medications on file as of 01/16/2022.  ? ? ?Care Gaps: ?Shinfrix Vaccine ?COVID-19 Vaccine (3- Pfizer risk series) ?Influenza Vaccine ? ?Star Rating Drugs: ?None ID ? ?Medication Fill Gaps: ?None ID ? ?Reviewed chart for medication changes ahead of medication coordination call. ? ?BP Readings from Last 3 Encounters:  ?01/15/22 124/62  ?05/29/21 130/80  ?02/06/21 126/70  ?  ?No results found for: HGBA1C  ? ?Patient obtains medications through Adherence Packaging  30 Days  ? ?Last adherence delivery included:  ?Aspirin 81 mg Daily -  bedtime ?Vitamin D3 50 MCG Daily- Breakfast ?Donepezil 10 mg tablet Daily -Bedtime ?Finasteride 5 mg tablet Daily-Bedtime ?Mirtazapine 45 mg tablet Daily-Bedtime ?Pantoprazole 40 mg tablet Daily- Breakfast ?Multivitamin Capsule Daily-Breakfast ?Valacyclovir 500 mg -PRN (Vials- patient is asking for at least 20-30 day supply)  ? ? Latanoprost 0.005% Eye Drops -one drop in each eye at bedtime-was filled on 01/02/2022 for 25 day supply. ? ?Patient declined medications last month: ?Restasis 0.05% two times Daily-Adequate supply ? ?Patient is due for next adherence delivery on: 01/28/2022. ?Called patient and reviewed medications and coordinated delivery. ? ?This delivery to include: ?Aspirin 81 mg Daily - bedtime ?Vitamin D3 50 MCG Daily- Breakfast ?Donepezil 10 mg tablet Daily -Bedtime ?Finasteride 5 mg tablet Daily-Bedtime ?Mirtazapine 45 mg tablet Daily-Bedtime ?Pantoprazole 40 mg tablet Daily- Breakfast ?Multivitamin Capsule Daily-Breakfast ? Latanoprost 0.005% Eye Drops -one drop in each eye at bedtime ? ?Patient declined the following medications: ?Valacyclovir 500 mg -PRN  ?Restasis 0.05% two times Daily-Adequate supply ? ?Patient needs refills for None ID. ? ?Confirmed delivery date of 01/28/2022 (Second route), advised patient that pharmacy will contact them the morning of delivery. ? ?Anderson Malta ?Clinical Pharmacist Assistant ?[phone]  ? ?

## 2022-02-12 ENCOUNTER — Ambulatory Visit (INDEPENDENT_AMBULATORY_CARE_PROVIDER_SITE_OTHER): Payer: PPO

## 2022-02-12 DIAGNOSIS — Z0000 Encounter for general adult medical examination without abnormal findings: Secondary | ICD-10-CM

## 2022-02-12 DIAGNOSIS — H18423 Band keratopathy, bilateral: Secondary | ICD-10-CM | POA: Diagnosis not present

## 2022-02-12 DIAGNOSIS — H40013 Open angle with borderline findings, low risk, bilateral: Secondary | ICD-10-CM | POA: Diagnosis not present

## 2022-02-12 DIAGNOSIS — H16223 Keratoconjunctivitis sicca, not specified as Sjogren's, bilateral: Secondary | ICD-10-CM | POA: Diagnosis not present

## 2022-02-12 DIAGNOSIS — Z961 Presence of intraocular lens: Secondary | ICD-10-CM | POA: Diagnosis not present

## 2022-02-12 NOTE — Patient Instructions (Signed)
[name] , ?Thank you for taking time to come for your Medicare Wellness Visit. I appreciate your ongoing commitment to your health goals. Please review the following plan we discussed and let me know if I can assist you in the future.  ? ?Screening recommendations/referrals: ?Colonoscopy: no longer required  ?Recommended yearly ophthalmology/optometry visit for glaucoma screening and checkup ?Recommended yearly dental visit for hygiene and checkup ? ?Vaccinations: ?Influenza vaccine: completed  ?Pneumococcal vaccine: completed  ?Tdap vaccine: due  ?Shingles vaccine: completed    ? ?Advanced directives: none  ? ?Conditions/risks identified: none  ? ?Next appointment: none  ? ?Preventive Care 85 Years and Older, Male ?Preventive care refers to lifestyle choices and visits with your health care provider that can promote health and wellness. ?What does preventive care include? ?A yearly physical exam. This is also called an annual well check. ?Dental exams once or twice a year. ?Routine eye exams. Ask your health care provider how often you should have your eyes checked. ?Personal lifestyle choices, including: ?Daily care of your teeth and gums. ?Regular physical activity. ?Eating a healthy diet. ?Avoiding tobacco and drug use. ?Limiting alcohol use. ?Practicing safe sex. ?Taking low doses of aspirin every day. ?Taking vitamin and mineral supplements as recommended by your health care provider. ?What happens during an annual well check? ?The services and screenings done by your health care provider during your annual well check will depend on your age, overall health, lifestyle risk factors, and family history of disease. ?Counseling  ?Your health care provider may ask you questions about your: ?Alcohol use. ?Tobacco use. ?Drug use. ?Emotional well-being. ?Home and relationship well-being. ?Sexual activity. ?Eating habits. ?History of falls. ?Memory and ability to understand (cognition). ?Work and work  Statistician. ?Screening  ?You may have the following tests or measurements: ?Height, weight, and BMI. ?Blood pressure. ?Lipid and cholesterol levels. These may be checked every 5 years, or more frequently if you are over 85 years old. ?Skin check. ?Lung cancer screening. You may have this screening every year starting at age 85 if you have a 30-pack-year history of smoking and currently smoke or have quit within the past 15 years. ?Fecal occult blood test (FOBT) of the stool. You may have this test every year starting at age 85. ?Flexible sigmoidoscopy or colonoscopy. You may have a sigmoidoscopy every 5 years or a colonoscopy every 10 years starting at age 85. ?Prostate cancer screening. Recommendations will vary depending on your family history and other risks. ?Hepatitis C blood test. ?Hepatitis B blood test. ?Sexually transmitted disease (STD) testing. ?Diabetes screening. This is done by checking your blood sugar (glucose) after you have not eaten for a while (fasting). You may have this done every 1-3 years. ?Abdominal aortic aneurysm (AAA) screening. You may need this if you are a current or former smoker. ?Osteoporosis. You may be screened starting at age 85 if you are at high risk. ?Talk with your health care provider about your test results, treatment options, and if necessary, the need for more tests. ?Vaccines  ?Your health care provider may recommend certain vaccines, such as: ?Influenza vaccine. This is recommended every year. ?Tetanus, diphtheria, and acellular pertussis (Tdap, Td) vaccine. You may need a Td booster every 10 years. ?Zoster vaccine. You may need this after age 34. ?Pneumococcal 13-valent conjugate (PCV13) vaccine. One dose is recommended after age 85. ?Pneumococcal polysaccharide (PPSV23) vaccine. One dose is recommended after age 85. ?Talk to your health care provider about which screenings and vaccines you need and  how often you need them. ?This information is not intended to replace  advice given to you by your health care provider. Make sure you discuss any questions you have with your health care provider. ?Document Released: 11/17/2015 Document Revised: 07/10/2016 Document Reviewed: 08/22/2015 ?Elsevier Interactive Patient Education ? 2017 [employer]. ? ?Fall Prevention in the Home ?Falls can cause injuries. They can happen to people of all ages. There are many things you can do to make your home safe and to help prevent falls. ?What can I do on the outside of my home? ?Regularly fix the edges of walkways and driveways and fix any cracks. ?Remove anything that might make you trip as you walk through a door, such as a raised step or threshold. ?Trim any bushes or trees on the path to your home. ?Use bright outdoor lighting. ?Clear any walking paths of anything that might make someone trip, such as rocks or tools. ?Regularly check to see if handrails are loose or broken. Make sure that both sides of any steps have handrails. ?Any raised decks and porches should have guardrails on the edges. ?Have any leaves, snow, or ice cleared regularly. ?Use sand or salt on walking paths during winter. ?Clean up any spills in your garage right away. This includes oil or grease spills. ?What can I do in the bathroom? ?Use night lights. ?Install grab bars by the toilet and in the tub and shower. Do not use towel bars as grab bars. ?Use non-skid mats or decals in the tub or shower. ?If you need to sit down in the shower, use a plastic, non-slip stool. ?Keep the floor dry. Clean up any water that spills on the floor as soon as it happens. ?Remove soap buildup in the tub or shower regularly. ?Attach bath mats securely with double-sided non-slip rug tape. ?Do not have throw rugs and other things on the floor that can make you trip. ?What can I do in the bedroom? ?Use night lights. ?Make sure that you have a light by your bed that is easy to reach. ?Do not use any sheets or blankets that are too big for your bed.  They should not hang down onto the floor. ?Have a firm chair that has side arms. You can use this for support while you get dressed. ?Do not have throw rugs and other things on the floor that can make you trip. ?What can I do in the kitchen? ?Clean up any spills right away. ?Avoid walking on wet floors. ?Keep items that you use a lot in easy-to-reach places. ?If you need to reach something above you, use a strong step stool that has a grab bar. ?Keep electrical cords out of the way. ?Do not use floor polish or wax that makes floors slippery. If you must use wax, use non-skid floor wax. ?Do not have throw rugs and other things on the floor that can make you trip. ?What can I do with my stairs? ?Do not leave any items on the stairs. ?Make sure that there are handrails on both sides of the stairs and use them. Fix handrails that are broken or loose. Make sure that handrails are as long as the stairways. ?Check any carpeting to make sure that it is firmly attached to the stairs. Fix any carpet that is loose or worn. ?Avoid having throw rugs at the top or bottom of the stairs. If you do have throw rugs, attach them to the floor with carpet tape. ?Make sure that you have  a light switch at the top of the stairs and the bottom of the stairs. If you do not have them, ask someone to add them for you. ?What else can I do to help prevent falls? ?Wear shoes that: ?Do not have high heels. ?Have rubber bottoms. ?Are comfortable and fit you well. ?Are closed at the toe. Do not wear sandals. ?If you use a stepladder: ?Make sure that it is fully opened. Do not climb a closed stepladder. ?Make sure that both sides of the stepladder are locked into place. ?Ask someone to hold it for you, if possible. ?Clearly mark and make sure that you can see: ?Any grab bars or handrails. ?First and last steps. ?Where the edge of each step is. ?Use tools that help you move around (mobility aids) if they are needed. These  include: ?Canes. ?Walkers. ?Scooters. ?Crutches. ?Turn on the lights when you go into a dark area. Replace any light bulbs as soon as they burn out. ?Set up your furniture so you have a clear path. Avoid moving your furniture around. ?If an

## 2022-02-12 NOTE — Progress Notes (Signed)
? ?Subjective:  ? [name] is a 87 y.o. male who presents for an Initial Medicare Annual Wellness Visit. ? ? ?I connected with [name] today by telephone and verified that I am speaking with the correct person using two identifiers. ?Location patient: home ?Location provider: work ?Persons participating in the virtual visit: patient, provider. ?  ?I discussed the limitations, risks, security and privacy concerns of performing an evaluation and management service by telephone and the availability of in person appointments. I also discussed with the patient that there may be a patient responsible charge related to this service. The patient expressed understanding and verbally consented to this telephonic visit.  ?  ?Interactive audio and video telecommunications were attempted between this provider and patient, however failed, due to patient having technical difficulties OR patient did not have access to video capability.  We continued and completed visit with audio only. ? ?  ?Review of Systems    ? ?Cardiac Risk Factors include: advanced age (>16mn, >>97women);male gender ? ?   ?Objective:  ?  ?Today's Vitals  ? ?There is no height or weight on file to calculate BMI. ? ? ?  02/12/2022  ?  9:02 AM 02/06/2021  ?  1:35 PM 02/02/2020  ? 11:55 AM 07/28/2017  ?  1:37 PM 11/20/2015  ?  8:48 AM 05/16/2015  ?  8:58 AM 07/23/2013  ? 11:45 AM  ?Advanced Directives  ?Does Patient Have a Medical Advance Directive? No No No No No No Patient does not have advance directive  ?Would patient like information on creating a medical advance directive? No - Patient declined No - Patient declined No - Patient declined Yes (ED - Information included in AVS)     ? ? ?Current Medications (verified) ?Outpatient Encounter Medications as of 02/12/2022  ?Medication Sig  ? aspirin 81 MG tablet Take 81 mg by mouth daily.  ? Cholecalciferol (VITAMIN D3) 50 MCG (2000 UT) TABS Take 2,000 Units by mouth daily.  ? cycloSPORINE (RESTASIS) 0.05  % ophthalmic emulsion 1 drop 2 (two) times daily.   ? donepezil (ARICEPT) 10 MG tablet Take 1 tablet (10 mg total) by mouth at bedtime. Must keep follow up 02/21/22 for ongoing refills  ? finasteride (PROSCAR) 5 MG tablet Take 5 mg by mouth daily.  ? latanoprost (XALATAN) 0.005 % ophthalmic solution 1 drop at bedtime.  ? mirtazapine (REMERON) 45 MG tablet TAKE ONE TABLET BY MOUTH EVERYDAY AT BEDTIME  ? Multiple Vitamin (MULTIVITAMIN) capsule Take 1 capsule by mouth daily.  ? pantoprazole (PROTONIX) 40 MG tablet TAKE ONE TABLET BY MOUTH EVERY MORNING Needs appointment for further refills  ? valACYclovir (VALTREX) 500 MG tablet TAKE ONE TABLET BY MOUTH twice daily FOR 3 DAYS AS NEEDED FOR outbreaks  ? ?No facility-administered encounter medications on file as of 02/12/2022.  ? ? ?Allergies (verified) ?Sulfamethoxazole and Sulfamethoxazole-trimethoprim  ? ?History: ?Past Medical History:  ?Diagnosis Date  ? Barrett's esophagus   ? Cataract   ? Diverticulosis of colon (without mention of hemorrhage)   ? Elevated PSA   ? Esophageal stricture   ? GERD (gastroesophageal reflux disease)   ? Glaucoma   ? Gout   ? Herpes   ? Hiatal hernia   ? History of colon cancer   ? HOH (hard of hearing)   ? Hyperlipidemia   ? Hypersomnia, persistent 11/24/2013  ? Reports Epworth of 10 and higher on CPAP with  good compliance.   ? Hypertrophy of prostate without  urinary obstruction and other lower urinary tract symptoms (LUTS)   ? Impotence of organic origin   ? Insomnia   ? Memory change 04/19/2013  ? OSA on CPAP   ? Prostate cancer ([place])   ? Prostatitis   ? Stroke [facility])   ? tia in  2010  ? TIA (transient ischemic attack)   ? Vertigo   ? ?Past Surgical History:  ?Procedure Laterality Date  ? EYE SURGERY    ? both cataracts  ? INTRACAPSULAR CATARACT EXTRACTION    ? Lt eye  ? MASS EXCISION N/A 07/29/2013  ? Procedure: MINOR EXCISION OF CHEST KELOID CONTRACTURE;  Surgeon: Theodoro Kos, DO;  Location: [facility];  Service:  Plastics;  Laterality: N/A;  ACELL placement  ? MINOR GRAFT APPLICATION N/A 1/75/1025  ? Procedure: MINOR GRAFT APPLICATION A CELL;  Surgeon: Theodoro Kos, DO;  Location: [facility];  Service: Plastics;  Laterality: N/A;  ? REFRACTIVE SURGERY    ? ?Family History  ?Problem Relation Age of Onset  ? Cirrhosis Mother   ? Jaundice Mother   ? Hyperlipidemia Father   ? Heart disease Father   ? Colon cancer Neg Hx   ? Esophageal cancer Neg Hx   ? Rectal cancer Neg Hx   ? Stomach cancer Neg Hx   ? ?Social History  ? ?Socioeconomic History  ? Marital status: Single  ?  Spouse name: Not on file  ? Number of children: 2  ? Years of education: 57  ? Highest education level: Not on file  ?Occupational History  ? Occupation: Retired  ?Tobacco Use  ? Smoking status: Former  ?  Types: Cigarettes  ?  Quit date: 07/23/1962  ?  Years since quitting: 59.6  ? Smokeless tobacco: Never  ? Tobacco comments:  ?  Stopped in 1963  ?Vaping Use  ? Vaping Use: Never used  ?Substance and Sexual Activity  ? Alcohol use: Yes  ?  Comment: occasionally  ? Drug use: No  ? Sexual activity: Yes  ?  Comment: 5 partners  ?Other Topics Concern  ? Not on file  ?Social History Narrative  ? Patient is single.  ? Patient has two children.  ? Patient is retired.  ? Patient has a college education.  ? Patient is right-handed.  ? Patient drinks about 3 cups of coffee daily.  ? Patient gets regular exercise.  ? ?Social Determinants of Health  ? ?Financial Resource Strain: Low Risk   ? Difficulty of Paying Living Expenses: Not hard at all  ?Food Insecurity: No Food Insecurity  ? Worried About Charity fundraiser in the Last Year: Never true  ? Ran Out of Food in the Last Year: Never true  ?Transportation Needs: No Transportation Needs  ? Lack of Transportation (Medical): No  ? Lack of Transportation (Non-Medical): No  ?Physical Activity: Sufficiently Active  ? Days of Exercise per Week: 3 days  ? Minutes of Exercise per Session: 60 min  ?Stress: No  Stress Concern Present  ? Feeling of Stress : Not at all  ?Social Connections: Socially Isolated  ? Frequency of Communication with Friends and Family: Twice a week  ? Frequency of Social Gatherings with Friends and Family: Twice a week  ? Attends Religious Services: Never  ? Active Member of Clubs or Organizations: No  ? Attends Archivist Meetings: Never  ? Marital Status: Divorced  ? ? ?Tobacco Counseling ?Counseling given: Not Answered ?Tobacco comments: Stopped in  1963 ? ? ?Clinical Intake: ? ?Pre-visit preparation completed: Yes ? ?Pain : No/denies pain ? ?  ? ?Nutritional Risks: None ?Diabetes: No ? ?How often do you need to have someone help you when you read instructions, pamphlets, or other written materials from your doctor or pharmacy?: 1 - Never ?What is the last grade level you completed in school?: college ? ?Diabetic?no  ? ?Interpreter Needed?: No ? ?Information entered by :: [email or web address] ? ? ?Activities of Daily Living ? ?  02/12/2022  ?  9:04 AM  ?In your present state of health, do you have any difficulty performing the following activities:  ?Hearing? 0  ?Vision? 0  ?Difficulty concentrating or making decisions? 0  ?Walking or climbing stairs? 0  ?Dressing or bathing? 0  ?Doing errands, shopping? 0  ?Preparing Food and eating ? N  ?Using the Toilet? N  ?In the past six months, have you accidently leaked urine? N  ?Do you have problems with loss of bowel control? N  ?Managing your Medications? N  ?Managing your Finances? N  ?Housekeeping or managing your Housekeeping? N  ? ? ?Patient Care Team: ?Libby Maw, MD as PCP - General (Family Medicine) ?Melissa Montane, MD as Consulting Physician (Otolaryngology) ?Dohmeier, Asencion Partridge, MD as Consulting Physician (Neurology) ?Dillingham, Loel Lofty, DO as Attending Physician (Plastic Surgery) ?Warden Fillers, MD as Consulting Physician (Ophthalmology) ?Franchot Gallo, MD as Consulting Physician (Urology) ?Kathrynn Ducking, MD (Inactive)  as Consulting Physician (Neurology) ?Germaine Pomfret, [facility] as Pharmacist (Pharmacist) ? ?Indicate any recent Medical Services you may have received from other than [facility] providers in the past year (date ma

## 2022-02-14 ENCOUNTER — Telehealth: Payer: Self-pay

## 2022-02-14 DIAGNOSIS — F341 Dysthymic disorder: Secondary | ICD-10-CM

## 2022-02-14 DIAGNOSIS — G4700 Insomnia, unspecified: Secondary | ICD-10-CM

## 2022-02-14 NOTE — Progress Notes (Signed)
? ? ?  Chronic Care Management ?Pharmacy Assistant  ? ?Name: [name]  MRN: [id number] DOB: 1935-04-02 ? ?Reason for Encounter: Medication Review/Medication Coordination Call. ?  ?Recent office visits:  ?02/12/2022 Randel Pigg LPN (PCP Office) Medicare Wellness Completed  ? ?Recent consult visits:  ?No recent Consult Visit ? ?Hospital visits:  ?None in previous 6 months ? ?Medications: ?Outpatient Encounter Medications as of 02/14/2022  ?Medication Sig  ? aspirin 81 MG tablet Take 81 mg by mouth daily.  ? Cholecalciferol (VITAMIN D3) 50 MCG (2000 UT) TABS Take 2,000 Units by mouth daily.  ? cycloSPORINE (RESTASIS) 0.05 % ophthalmic emulsion 1 drop 2 (two) times daily.   ? donepezil (ARICEPT) 10 MG tablet Take 1 tablet (10 mg total) by mouth at bedtime. Must keep follow up 02/21/22 for ongoing refills  ? finasteride (PROSCAR) 5 MG tablet Take 5 mg by mouth daily.  ? latanoprost (XALATAN) 0.005 % ophthalmic solution 1 drop at bedtime.  ? mirtazapine (REMERON) 45 MG tablet TAKE ONE TABLET BY MOUTH EVERYDAY AT BEDTIME  ? Multiple Vitamin (MULTIVITAMIN) capsule Take 1 capsule by mouth daily.  ? pantoprazole (PROTONIX) 40 MG tablet TAKE ONE TABLET BY MOUTH EVERY MORNING Needs appointment for further refills  ? valACYclovir (VALTREX) 500 MG tablet TAKE ONE TABLET BY MOUTH twice daily FOR 3 DAYS AS NEEDED FOR outbreaks  ? ?No facility-administered encounter medications on file as of 02/14/2022.  ? ? ?Care Gaps: ?Shinfrix Vaccine ?COVID-19 Vaccine (3- Pfizer risk series) ?  ?Star Rating Drugs: ?None ID ?  ?Medication Fill Gaps: ?None ID ? ?Reviewed chart for medication changes ahead of medication coordination call. ? ? ?BP Readings from Last 3 Encounters:  ?01/15/22 124/62  ?05/29/21 130/80  ?02/06/21 126/70  ?  ?No results found for: HGBA1C  ? ?Patient obtains medications through Adherence Packaging  30 Days  ? ?Last adherence delivery included:  ?Aspirin 81 mg Daily - bedtime ?Vitamin D3 50 MCG Daily-  Breakfast ?Donepezil 10 mg tablet Daily -Bedtime ?Finasteride 5 mg tablet Daily-Bedtime ?Mirtazapine 45 mg tablet Daily-Bedtime ?Pantoprazole 40 mg tablet Daily- Breakfast ?Multivitamin Capsule Daily-Breakfast ? Latanoprost 0.005% Eye Drops -one drop in each eye at bedtime ? ?Patient declined medication  last month: ?Valacyclovir 500 mg -PRN  ?Restasis 0.05% two times Daily-Adequate supply ? ?Patient is due for next adherence delivery on: 02/26/2022. ?Called patient and reviewed medications and coordinated delivery. ? ?This delivery to include: ?Aspirin 81 mg Daily - bedtime ?Vitamin D3 50 MCG Daily- Breakfast ?Donepezil 10 mg tablet Daily -Bedtime ?Finasteride 5 mg tablet Daily-Bedtime ?Mirtazapine 45 mg tablet Daily-Bedtime ?Pantoprazole 40 mg tablet Daily- Breakfast ?Multivitamin Capsule Daily-Breakfast ? Latanoprost 0.005% Eye Drops -one drop in each eye at bedtime ? ?Patient declined the following medications: ?Valacyclovir 500 mg -PRN  ?Restasis 0.05% two times Daily-Adequate supply ? ?Patient needs refills for Mirtazapine prescribe by PCP . ? ?Confirmed delivery date of 02/26/2022 (second Route), advised patient that pharmacy will contact them the morning of delivery. ? ?Anderson Malta ?Clinical Pharmacist Assistant ?[phone]  ? ?

## 2022-02-18 ENCOUNTER — Telehealth: Payer: Self-pay

## 2022-02-18 MED ORDER — MIRTAZAPINE 45 MG PO TABS: ORAL_TABLET | ORAL | 2 refills | Status: DC

## 2022-02-18 NOTE — Progress Notes (Signed)
Left a voice message per Clinical pharmacist, please inform him to make PCP follow-up, last visit was 05/29/21. ? ?Bessie Kellihan,CPA ?Clinical Pharmacist Assistant ?[phone]  ? ?

## 2022-02-18 NOTE — Progress Notes (Signed)
Patient return my call and verbalized understanding, and states he will call and schedule appointment with Dr.Rudd whom he wants to be his PCP. ? ?Anderson Malta ?Clinical Pharmacist Assistant ?[phone]  ? ?

## 2022-02-18 NOTE — Addendum Note (Signed)
Addended by: [name] A on: 02/18/2022 10:52 AM ? ? Modules accepted: Orders ? ?

## 2022-02-21 ENCOUNTER — Ambulatory Visit: Payer: PPO | Admitting: Neurology

## 2022-02-21 ENCOUNTER — Encounter: Payer: Self-pay | Admitting: Neurology

## 2022-02-21 VITALS — BP 136/82 | HR 83 | Ht 66.0 in | Wt 143.0 lb

## 2022-02-21 DIAGNOSIS — G3184 Mild cognitive impairment, so stated: Secondary | ICD-10-CM | POA: Diagnosis not present

## 2022-02-21 DIAGNOSIS — G4733 Obstructive sleep apnea (adult) (pediatric): Secondary | ICD-10-CM

## 2022-02-21 MED ORDER — DONEPEZIL HCL 10 MG PO TABS: 10.0000 mg | ORAL_TABLET | Freq: Every day | ORAL | 5 refills | Status: DC

## 2022-02-21 NOTE — Progress Notes (Signed)
? ? ?[facility] ? ?PATIENT: [name] ?DOB: 1935-01-12 ? ? ?REASON FOR VISIT: Follow-up obstructive sleep apnea on BiPAP ?HISTORY FROM: Patient alone.  ? ? ? ?HISTORY OF PRESENT ILLNESS: ? ? ?02-21-2022: ?RV Interval history for [name]. [name], now 87 years-old sleep patient who was a complaint BiPAP user , he  now hasn't been nursing the PAP therapy for 3 months after developing pressure marks, he sleeps without dentures which fostered oral air leaks.  ?He is also concerned abut memory- MOCA is 25/ 30 , one point below normal and he lost 3 points in recall of words. Good trail making test. Short term memory impaired, but mildly so. He sometimes 9rarely)  has trouble recalling names. He does his own accounting.  ? ? ?08-31-2020- RV  Interval history for [name]. Pischke Massey, an 87 year -old caucasian sleep patient.   ?He presents today with an 83% compliance 4 days and 78% compliance by hours using his machine on days used average 6 hours 19 minutes.  He is using IV although with an inspiratory pressure setting of 17 expiratory pressure setting of 13 and is a brief function, his residual AHI is 0.8 which speaks for an excellent resolution , 95th percentile air leak  is 11.4L/minute. he sleeps without dentures.  ?So I am very happy with his compliance and the results of his BiPAP therapy.  Upon the patient's request and concern about subjective memory loss he was today undergoing a Montreal cognitive assessment.  This showed short-term memory trouble. ?Epworth at 6 points/ 24 .  ? ?He scored 26 out of 30 points on his [place] fully oriented able to abstract, calculate, name animals and perform visual-spatial maneuvers.  He had some trouble with trouble with the Trail Making Test which is common when encountered first time and he did not recall on first try 3 out of 5 of his recall words.  ? So this is fairly similar to his MMSE results which also were always showing short term memory  difficulties for words only. ? ? ?  11/25/2016  ?  8:44 AM 05/21/2016  ?  8:56 AM 11/20/2015  ?  8:51 AM 05/16/2015  ?  9:00 AM  ?MMSE - Mini Mental State Exam  ?Orientation to time '5 5 5 5  '$ ?Orientation to Place '5 5 5 5  '$ ?Registration '3 3 3 3  '$ ?Attention/ Calculation '3 4 5 5  '$ ?Recall '2 3 3 3  '$ ?Language- name 2 objects '2 2 2 2  '$ ?Language- repeat '1 1 1 1  '$ ?Language- follow 3 step command '3 3 2 3  '$ ?Language- read & follow direction '1 1 1 1  '$ ?Write a sentence 1 1 0 1  ?Copy design '1 1 1 1  '$ ?Total score '27 29 28 30  '$ ? ? ?  02/21/2022  ?  1:25 PM 08/31/2020  ?  3:36 PM  ?Montreal Cognitive Assessment   ?Visuospatial/ Executive (0/5) 4 4  ?Naming (0/3) 3 3  ?Attention: Read list of digits (0/2) 2 2  ?Attention: Read list of letters (0/1) 1 1  ?Attention: Serial 7 subtraction starting at 100 (0/3) 3 3  ?Language: Repeat phrase (0/2) 2 2  ?Language : Fluency (0/1) 0 1  ?Abstraction (0/2) 2 2  ?Delayed Recall (0/5) 2 2  ?Orientation (0/6) 6 6  ?Total 25 26  ?Adjusted Score (based on education) 25   ? ? ? ?01-19-2020:  ?I have the pleasure of seeing  [name] today again he is an established BiPAP user and in his last visit was me we were concerned about her slowly rising apnea hypopnea index.  I adjusted the settings to currently 17/13 centimeters water pressure and his AHI is now 4.9 which is a satisfying result.  There are no central apneas emerging anymore.  He is highly compliant and on average he uses the machine for 7 hours and 30 minutes at night, he is 87% compliant with his days he had a.  Of 3 days where he could not use the machine.  Overall previous months were at 100% compliance.  He has no major air leakage his fatigue score was endorsed at 10 out of 63 points his sleepiness score at 4 out of 24 points.  There is 0 there is 1 point on his depression score out of 15 possible points. ?He is living alone, has two adult children, a daughter and a son. He is sleeping less long , wakes up after 3-4 hours and would like  an increase in meds. He has invested in [employer] research (life sciences) estate and is not financially strained.  ?BP is elevated today. Aricept has been taken for 5 years upon recommendation of a pharmacist friend.  ? ? ?I have the pleasure of meeting today with DavidOlin "David Lister" Utecht Massey, who has followed as here for BiPAP compliance for long time.  The patient presents again with excellent compliance of 97%, 29 out of 30 days with an average user time of 6 hours 35 minutes, inspiratory pressure is set to 13 expiratory pressure to 10 cmH2O with a residual AHI of 19.8 obstructive apneas.  There were no air leaks and I can really not explained this  high apnea number.  He reports that it is not troublesome for him to breathe, he is compliant because he likes to use his BiPAP, and I wonder if we have to increase his pressure by about 2 cmH2O.  ?Last AHI was 14/h.  He reports having GERD and related apneas are a possibility, he had esophageal stretching procedures with GI. He remains hoarse.  I like for him to have a new BiPAP titration.   ? ? ?UPDATE 05/28/2018 [name], 87 year old male returns for follow-up for BiPAP compliance.  He continues to have compliance greater than 4 hours at 83%.  Average usage 6 hours 5 minutes.  Set pressure 9 to 13 cm.  Leak 95th percentile 4.1 AHI 14.4/h  which is much improved from last visit.  No central apneas, his  ESS 4/ 24 .  He still says he is not sleeping enough that he did get a wedge and that has helped with his GERD.  ? He returns for reevaluation ? ?Interval history from 24 September 2017,CD I have the pleasure of meeting today with [name] after a one-year hiatus.  The patient has been followed for CPAP compliance in our office for several years now.  He has a high compliance again 87% of the time, average use of time is 5 hours and 31 minutes, he is using a VPAP auto machine inspiratory pressure is set at 19 cm water expiratory pressure at 15, he does not have detectable air leaks but  still high a number of obstructive apneas which has bothered me for years.  He has not noticed a difference between BiPAP and CPAP.  There has been a trend over the last 30 days to higher AHI is from about 10 on average each night to  almost 40, and this is not corresponding to air leaks, usage time there has been no adjustment in pressure. The BiPAP's consistently good airflow pattern and data for respiratory rate contradict the measured residual AHI of over 30. He has severe GERD- could that be the  Artefact ? - yes, oesophageal pressure will induce a reading of OSA, he is hoarse , too.  He should change to an elevated pillow, wedge .  ?  ? ?UPDATE 9/28 /2018CM DavidKetron, 87 year old male returns for follow-up with history of obstructive sleep apnea on BiPAP. He feels he is doing well however compliance data actually worse than 3 months ago. Data dated 07/02/2017 through 9/27/ 2018 Shows 90% compliance greater than 4 hours. Average usage 6 hours 18 minutes. Pressure set 13-17 cm. AHI 32.7 central apneas 0.3. Obstructive apneas 29.2. Patient also feels he has a Leak however this is not evident on the report.Marland Kitchen He is wondering if his Remeron can be increased. He returns for reevaluation ? ?UPDATE 6/28/ 2018CM David. Grudzinski, 87 year old male returns for follow-up with history of obstructive sleep apnea. He was on CPAP when last seen he was switched to BiPAP due to high residual apnea index. He feels that he is doing well on BiPAP Compliance data reviewed today for 30 days from 03/31/2017 2 04/29/2017 100% compliance for 30 days greater than 4 hours. Average usage 7 hours 14 minutes. Pressure set 16 cm/12 cm of pressure. AHI 21.3. Central apneas 0.3. Obstructive apneas 17.2. He returns to discuss this download. ? ?01/30/17 MM- David. Tsukamoto is a 87 year old male with a history of memory disturbance and obstructive sleep apnea on CPAP. He returns today for a compliance download. At the last visit his pressure was increased.  His download indicates that he uses his machine 60/60 days for compliance of 100%. He uses machine greater than 4 hours 32/60 days for compliance of 53%. On average he uses his machine 4 hours and 7 minutes.

## 2022-02-22 DIAGNOSIS — G4733 Obstructive sleep apnea (adult) (pediatric): Secondary | ICD-10-CM | POA: Diagnosis not present

## 2022-03-18 ENCOUNTER — Telehealth: Payer: Self-pay

## 2022-03-18 NOTE — Progress Notes (Signed)
    Chronic Care Management Pharmacy Assistant   Name: [name]  MRN: [id number] DOB: 08/23/1935  Reason for Encounter: Medication Review/Medication Coordination Call.   Recent office visits:  No recent office visit  Recent consult visits:  02/21/2022 Dr. [name] Fairy MD (Neurology) No Medication Changes noted, return in 3 months  Hospital visits:  None in previous 6 months  Medications: Outpatient Encounter Medications as of 03/18/2022  Medication Sig   aspirin 81 MG tablet Take 81 mg by mouth daily.   Cholecalciferol (VITAMIN D3) 50 MCG (2000 UT) TABS Take 2,000 Units by mouth daily.   cycloSPORINE (RESTASIS) 0.05 % ophthalmic emulsion 1 drop 2 (two) times daily.    donepezil (ARICEPT) 10 MG tablet Take 1 tablet (10 mg total) by mouth at bedtime. Must keep follow up 02/21/22 for ongoing refills   finasteride (PROSCAR) 5 MG tablet Take 5 mg by mouth daily.   latanoprost (XALATAN) 0.005 % ophthalmic solution 1 drop at bedtime.   mirtazapine (REMERON) 45 MG tablet TAKE ONE TABLET BY MOUTH EVERYDAY AT BEDTIME   Multiple Vitamin (MULTIVITAMIN) capsule Take 1 capsule by mouth daily.   pantoprazole (PROTONIX) 40 MG tablet TAKE ONE TABLET BY MOUTH EVERY MORNING Needs appointment for further refills   valACYclovir (VALTREX) 500 MG tablet TAKE ONE TABLET BY MOUTH twice daily FOR 3 DAYS AS NEEDED FOR outbreaks   No facility-administered encounter medications on file as of 03/18/2022.   Care Gaps: Shinfrix Vaccine COVID-19 Vaccine (3- Pfizer risk series)   Star Rating Drugs: None ID   Medication Fill Gaps: None ID  Reviewed chart for medication changes ahead of medication coordination call.  BP Readings from Last 3 Encounters:  02/21/22 136/82  01/15/22 124/62  05/29/21 130/80    No results found for: HGBA1C   Patient obtains medications through Adherence Packaging  30 Days   Last adherence delivery included:  Aspirin 81 mg Daily - bedtime Vitamin D3 50 MCG Daily-  Breakfast Donepezil 10 mg tablet Daily -Bedtime Finasteride 5 mg tablet Daily-Bedtime Mirtazapine 45 mg tablet Daily-Bedtime Pantoprazole 40 mg tablet Daily- Breakfast Multivitamin Capsule Daily-Breakfast  Latanoprost 0.005% Eye Drops -one drop in each eye at bedtime  Patient declined medications last month: Valacyclovir 500 mg -PRN  Restasis 0.05% two times Daily-Adequate supply  Patient is due for next adherence delivery on: 03/28/2022. Called patient and reviewed medications and coordinated delivery.  This delivery to include: Aspirin 81 mg Daily - bedtime Vitamin D3 50 MCG Daily- Breakfast Donepezil 10 mg tablet Daily -Bedtime Finasteride 5 mg tablet Daily-Bedtime Mirtazapine 45 mg tablet Daily-Bedtime Pantoprazole 40 mg tablet Daily- Breakfast Multivitamin Capsule Daily-Breakfast  Latanoprost 0.005% Eye Drops -one drop in each eye at bedtime   Patient declined the following medications Valacyclovir 500 mg -PRN  Restasis 0.05% two times Daily-Adequate supply  Patient needs refills for None ID.  Confirmed delivery date of 03/28/2022 (Second route), advised patient that pharmacy will contact them the morning of delivery.   Patient states he will reach out to his PCP office to schedule a follow up appointment.  [place] Pharmacist Assistant [phone]

## 2022-04-02 ENCOUNTER — Encounter: Payer: Self-pay | Admitting: Neurology

## 2022-04-11 ENCOUNTER — Telehealth: Payer: Self-pay | Admitting: Family Medicine

## 2022-04-11 NOTE — Telephone Encounter (Signed)
Pt would like to TOC to Dr Gena Fray. When both have replied I will call Pt and schedule the appt.

## 2022-04-16 ENCOUNTER — Telehealth: Payer: Self-pay

## 2022-04-16 NOTE — Progress Notes (Unsigned)
    Chronic Care Management Pharmacy Assistant   Name: [name]  MRN: [id number] DOB: 1935/08/31  Reason for Encounter: Medication Review/Medication Coordination Call.   Recent office visits:  No recent office visit  Recent consult visits:  No recent consult visit  Hospital visits:  None in previous 6 months  Medications: Outpatient Encounter Medications as of 04/16/2022  Medication Sig   aspirin 81 MG tablet Take 81 mg by mouth daily.   Cholecalciferol (VITAMIN D3) 50 MCG (2000 UT) TABS Take 2,000 Units by mouth daily.   cycloSPORINE (RESTASIS) 0.05 % ophthalmic emulsion 1 drop 2 (two) times daily.    donepezil (ARICEPT) 10 MG tablet Take 1 tablet (10 mg total) by mouth at bedtime. Must keep follow up 02/21/22 for ongoing refills   finasteride (PROSCAR) 5 MG tablet Take 5 mg by mouth daily.   latanoprost (XALATAN) 0.005 % ophthalmic solution 1 drop at bedtime.   mirtazapine (REMERON) 45 MG tablet TAKE ONE TABLET BY MOUTH EVERYDAY AT BEDTIME   Multiple Vitamin (MULTIVITAMIN) capsule Take 1 capsule by mouth daily.   pantoprazole (PROTONIX) 40 MG tablet TAKE ONE TABLET BY MOUTH EVERY MORNING Needs appointment for further refills   valACYclovir (VALTREX) 500 MG tablet TAKE ONE TABLET BY MOUTH twice daily FOR 3 DAYS AS NEEDED FOR outbreaks   No facility-administered encounter medications on file as of 04/16/2022.    Care Gaps: Shinfrix Vaccine COVID-19 Vaccine (3- Pfizer risk series)   Star Rating Drugs: None ID   Medication Fill Gaps: None ID  Reviewed chart for medication changes ahead of medication coordination call.  BP Readings from Last 3 Encounters:  02/21/22 136/82  01/15/22 124/62  05/29/21 130/80    No results found for: "HGBA1C"   Patient obtains medications through Adherence Packaging  30 Days   Last adherence delivery included: Aspirin 81 mg Daily - bedtime Vitamin D3 50 MCG Daily- Breakfast Donepezil 10 mg tablet Daily -Bedtime Finasteride 5  mg tablet Daily-Bedtime Mirtazapine 45 mg tablet Daily-Bedtime Pantoprazole 40 mg tablet Daily- Breakfast Multivitamin Capsule Daily-Breakfast  Latanoprost 0.005% Eye Drops -one drop in each eye at bedtime  Patient declined medications last month  Valacyclovir 500 mg -PRN  Restasis 0.05% two times Daily-Adequate supply  Patient is due for next adherence delivery on: 04/26/2022. Called patient and reviewed medications and coordinated delivery.  This delivery to include: Patient will need a short fill of (med), prior to adherence delivery. (To align with sync date or if PRN med)  Coordinated acute fill for (med) to be delivered (date).  Patient declined the following medications (meds) due to (reason)  Patient needs refills for ***.  Confirmed delivery date of ***, advised patient that pharmacy will contact them the morning of delivery.

## 2022-05-07 ENCOUNTER — Encounter: Payer: Self-pay | Admitting: Emergency Medicine

## 2022-05-07 ENCOUNTER — Ambulatory Visit
Admission: EM | Admit: 2022-05-07 | Discharge: 2022-05-07 | Disposition: A | Discharge status: Home / Self Care | Payer: PPO | Attending: Urgent Care | Admitting: Urgent Care

## 2022-05-07 DIAGNOSIS — S61411A Laceration without foreign body of right hand, initial encounter: Secondary | ICD-10-CM

## 2022-05-07 DIAGNOSIS — M79641 Pain in right hand: Secondary | ICD-10-CM

## 2022-05-07 DIAGNOSIS — Z23 Encounter for immunization: Secondary | ICD-10-CM | POA: Diagnosis not present

## 2022-05-07 DIAGNOSIS — W540XXA Bitten by dog, initial encounter: Secondary | ICD-10-CM | POA: Diagnosis not present

## 2022-05-07 MED ORDER — ACETAMINOPHEN 325 MG PO TABS: 650.0000 mg | ORAL_TABLET | Freq: Four times a day (QID) | ORAL | 0 refills | Status: AC | PRN

## 2022-05-07 MED ORDER — AMOXICILLIN-POT CLAVULANATE 875-125 MG PO TABS: 1.0000 | ORAL_TABLET | Freq: Two times a day (BID) | ORAL | 0 refills | Status: DC

## 2022-05-07 MED ORDER — TETANUS-DIPHTH-ACELL PERTUSSIS 5-2.5-18.5 LF-MCG/0.5 IM SUSY
0.5000 mL | PREFILLED_SYRINGE | Freq: Once | INTRAMUSCULAR | Status: AC
  Administered 2022-05-07: 0.5 mL via INTRAMUSCULAR

## 2022-05-07 MED ORDER — ACETAMINOPHEN 325 MG PO TABS: 650.0000 mg | ORAL_TABLET | Freq: Four times a day (QID) | ORAL | 0 refills | Status: DC | PRN

## 2022-05-07 NOTE — ED Provider Notes (Signed)
[facility]   MRN: [id number] DOB: 10-20-1935  Subjective:   [name] is a 87 y.o. male presenting for suffering multiple dog bites to the right hand today an hour prior to coming into the clinic.  Patient beach over the neighbors fence and the dog bit him.  No history of CKD.  Dog is known and up-to-date on the rabies vaccination.  No current facility-administered medications for this encounter.  Current Outpatient Medications:    aspirin 81 MG tablet, Take 81 mg by mouth daily., Disp: , Rfl:    Cholecalciferol (VITAMIN D3) 50 MCG (2000 UT) TABS, Take 2,000 Units by mouth daily., Disp: , Rfl:    cycloSPORINE (RESTASIS) 0.05 % ophthalmic emulsion, 1 drop 2 (two) times daily. , Disp: , Rfl:    donepezil (ARICEPT) 10 MG tablet, Take 1 tablet (10 mg total) by mouth at bedtime. Must keep follow up 02/21/22 for ongoing refills, Disp: 30 tablet, Rfl: 5   finasteride (PROSCAR) 5 MG tablet, Take 5 mg by mouth daily., Disp: , Rfl:    latanoprost (XALATAN) 0.005 % ophthalmic solution, 1 drop at bedtime., Disp: , Rfl:    mirtazapine (REMERON) 45 MG tablet, TAKE ONE TABLET BY MOUTH EVERYDAY AT BEDTIME, Disp: 30 tablet, Rfl: 2   Multiple Vitamin (MULTIVITAMIN) capsule, Take 1 capsule by mouth daily., Disp: , Rfl:    pantoprazole (PROTONIX) 40 MG tablet, TAKE ONE TABLET BY MOUTH EVERY MORNING Needs appointment for further refills, Disp: 30 tablet, Rfl: 11   valACYclovir (VALTREX) 500 MG tablet, TAKE ONE TABLET BY MOUTH twice daily FOR 3 DAYS AS NEEDED FOR outbreaks, Disp: 6 tablet, Rfl: 4   Allergies  Allergen Reactions   Sulfamethoxazole Other (See Comments)    Constipation   Sulfamethoxazole-Trimethoprim     constipation    Past Medical History:  Diagnosis Date   Barrett's esophagus    Cataract    Diverticulosis of colon (without mention of hemorrhage)    Elevated PSA    Esophageal stricture    GERD (gastroesophageal reflux disease)    Glaucoma    Gout     Herpes    Hiatal hernia    History of colon cancer    HOH (hard of hearing)    Hyperlipidemia    Hypersomnia, persistent 11/24/2013   Reports Epworth of 10 and higher on CPAP with  good compliance.    Hypertrophy of prostate without urinary obstruction and other lower urinary tract symptoms (LUTS)    Impotence of organic origin    Insomnia    Memory change 04/19/2013   OSA on CPAP    Prostate cancer ([place])    Prostatitis    Stroke ([place])    tia in  2010   TIA (transient ischemic attack)    Vertigo      Past Surgical History:  Procedure Laterality Date   EYE SURGERY     both cataracts   INTRACAPSULAR CATARACT EXTRACTION     Lt eye   MASS EXCISION N/A 07/29/2013   Procedure: MINOR EXCISION OF CHEST KELOID CONTRACTURE;  Surgeon: Theodoro Kos, DO;  Location: [facility];  Service: Plastics;  Laterality: N/A;  ACELL placement   MINOR GRAFT APPLICATION N/A 01/02/6009   Procedure: MINOR GRAFT APPLICATION A CELL;  Surgeon: Theodoro Kos, DO;  Location: [place] [facility];  Service: Plastics;  Laterality: N/A;   REFRACTIVE SURGERY      Family History  Problem Relation Age of Onset  Cirrhosis Mother    Jaundice Mother    Hyperlipidemia Father    Heart disease Father    Colon cancer Neg Hx    Esophageal cancer Neg Hx    Rectal cancer Neg Hx    Stomach cancer Neg Hx     Social History   Tobacco Use   Smoking status: Former    Types: Cigarettes    Quit date: 07/23/1962    Years since quitting: 59.8   Smokeless tobacco: Never   Tobacco comments:    Stopped in 1963  Vaping Use   Vaping Use: Never used  Substance Use Topics   Alcohol use: Yes    Comment: occasionally   Drug use: No    ROS   Objective:   Vitals: BP (!) 153/81   Pulse 86   Temp 97.9 F (36.6 C)   Resp 20   SpO2 98%   Physical Exam Constitutional:      General: He is not in acute distress.    Appearance: Normal appearance. He is well-developed and normal weight. He is not  ill-appearing, toxic-appearing or diaphoretic.  HENT:     Head: Normocephalic and atraumatic.     Right Ear: External ear normal.     Left Ear: External ear normal.     Nose: Nose normal.     Mouth/Throat:     Pharynx: Oropharynx is clear.  Eyes:     General: No scleral icterus.       Right eye: No discharge.        Left eye: No discharge.     Extraocular Movements: Extraocular movements intact.  Cardiovascular:     Rate and Rhythm: Normal rate.  Pulmonary:     Effort: Pulmonary effort is normal.  Musculoskeletal:       Hands:     Cervical back: Normal range of motion.  Neurological:     Mental Status: He is alert and oriented to person, place, and time.  Psychiatric:        Mood and Affect: Mood normal.        Behavior: Behavior normal.        Thought Content: Thought content normal.        Judgment: Judgment normal.    PROCEDURE NOTE: laceration repair Verbal consent obtained from patient.  Local anesthesia with 2cc Lidocaine 2% with epinephrine.  Wounds cleansed thoroughly.  Wound explored for tendon, ligament damage. Wound scrubbed with soap and water and rinsed. Wound closed with #1 3-0 Prolene (simple interrupted) sutures.  Wound cleansed and dressed.  Assessment and Plan :   PDMP not reviewed this encounter.  1. Right hand pain   2. Cause of injury, dog bite, initial encounter   3. Laceration of right hand without foreign body, initial encounter   4. Need for diphtheria-tetanus-pertussis (Tdap) vaccine    Wound loosely approximated as it was a large laceration.  Discussed wound care.  Patient is to start Augmentin for the dog bite.  Tylenol for pain relief.  Follow-up in 2 days for wound check.  Return to clinic in 10 days for suture removal. Counseled patient on potential for adverse effects with medications prescribed/recommended today, ER and return-to-clinic precautions discussed, patient verbalized understanding.    Jaynee Eagles, [place] 05/10/22 [phone]

## 2022-05-07 NOTE — ED Triage Notes (Signed)
Pt here with a dog bite to the right hand today. Pt states he reached into the neighbors fence and the dog bit him. The dog is up to date on his rabies vaccine.

## 2022-05-07 NOTE — Discharge Instructions (Signed)
WOUND CARE Please return in 10 days to have your stitches/staples removed or sooner if you have concerns.  Keep area clean and dry for 24 hours. Do not remove bandage, if applied.  After 24 hours, remove bandage and wash wound gently with mild soap and warm water. Reapply a new bandage after cleaning wound, if directed.  Continue daily cleansing with soap and water until stitches/staples are removed.  Do not apply any ointments or creams to the wound while stitches/staples are in place, as this may cause delayed healing.  Notify the office if you experience any of the following signs of infection: Swelling, redness, pus drainage, streaking, fever >101.0 F  Notify the office if you experience excessive bleeding that does not stop after 15-20 minutes of constant, firm pressure.  Start Augmentin to help with the infection.  Use Tylenol for pain relief.

## 2022-05-09 ENCOUNTER — Ambulatory Visit
Admission: EM | Admit: 2022-05-09 | Discharge: 2022-05-09 | Disposition: A | Discharge status: Home / Self Care | Payer: PPO

## 2022-05-09 DIAGNOSIS — S61451D Open bite of right hand, subsequent encounter: Secondary | ICD-10-CM

## 2022-05-09 DIAGNOSIS — W540XXD Bitten by dog, subsequent encounter: Secondary | ICD-10-CM

## 2022-05-09 DIAGNOSIS — Z5189 Encounter for other specified aftercare: Secondary | ICD-10-CM

## 2022-05-09 NOTE — ED Triage Notes (Signed)
Patient returns stating the provider told him to come in to have his wound (right hand) re-evaluated, the patient has one suture between his index and middle finger and also has an abrasion to the top of his right index finger. There is scant amount of blood.

## 2022-05-09 NOTE — ED Provider Notes (Signed)
Patient returns for follow-up of wound check after having been advised to do so by the provider that sutured wound on his right hand at the base of his index finger secondary to dog bite.  Patient states he is tolerating antibiotics well, and is satisfied with the result of his wound closure.  Wound is intact without purulent drainage, surrounding erythema or induration, tenderness to palpation.  Patient reports good range of motion of his right index and middle finger.  No services were provided for this patient today.  Patient advised to return at the recent advised time to have his suture removed.   Lynden Oxford Scales, PA-C 05/09/22 1049

## 2022-05-13 ENCOUNTER — Other Ambulatory Visit: Payer: Self-pay | Admitting: Family Medicine

## 2022-05-13 DIAGNOSIS — G4700 Insomnia, unspecified: Secondary | ICD-10-CM

## 2022-05-13 DIAGNOSIS — F341 Dysthymic disorder: Secondary | ICD-10-CM

## 2022-05-16 ENCOUNTER — Telehealth: Payer: Self-pay

## 2022-05-16 ENCOUNTER — Ambulatory Visit
Admission: EM | Admit: 2022-05-16 | Discharge: 2022-05-16 | Disposition: A | Discharge status: Home / Self Care | Payer: PPO | Attending: Internal Medicine | Admitting: Internal Medicine

## 2022-05-16 NOTE — Progress Notes (Signed)
Chronic Care Management Pharmacy Assistant   Name: Paris DUCRE III  MRN: [id number] DOB: May 27, 1935  Reason for Encounter: Medication Review/Medication Coordination Call.   Recent office visits:  No recent office visit  Recent consult visits:  No recent consult visit  Hospital visits:  Medication Reconciliation was completed by comparing discharge summary, patient's EMR and Pharmacy list, and upon discussion with patient.  Admitted to the hospital on 05/09/2022 due to Visit for wound check. Discharge date was 05/09/2022. Discharged from [facility] Urgent [place]?Medications Started at [facility] Discharge:?? -started None ID  Medication Changes at Hospital Discharge: -Changed None ID  Medications Discontinued at Hospital Discharge: -Stopped None ID  Medications that remain the same after Hospital Discharge:??  -All other medications will remain the same.    Admitted to the hospital on 05/07/2022 due to Right hand pain. Discharge date was 05/07/2022. Discharged from [facility] urgent care [place]?Medications Started at [facility] Discharge:?? -started Augmentin 875- 125 MG - Started Tylenol 650 mg PRN  Medication Changes at Hospital Discharge: -Changed None ID  Medications Discontinued at Hospital Discharge: -Stopped None  Medications that remain the same after Hospital Discharge:??  -All other medications will remain the same.    Medications: Outpatient Encounter Medications as of 05/16/2022  Medication Sig   acetaminophen (TYLENOL) 325 MG tablet Take 2 tablets (650 mg total) by mouth every 6 (six) hours as needed for moderate pain.   amoxicillin-clavulanate (AUGMENTIN) 875-125 MG tablet Take 1 tablet by mouth 2 (two) times daily.   aspirin 81 MG tablet Take 81 mg by mouth daily.   Cholecalciferol (VITAMIN D3) 50 MCG (2000 UT) TABS Take 2,000 Units by mouth daily.   cycloSPORINE (RESTASIS) 0.05 % ophthalmic emulsion 1 drop 2 (two) times  daily.    donepezil (ARICEPT) 10 MG tablet Take 1 tablet (10 mg total) by mouth at bedtime. Must keep follow up 02/21/22 for ongoing refills   finasteride (PROSCAR) 5 MG tablet Take 5 mg by mouth daily.   latanoprost (XALATAN) 0.005 % ophthalmic solution 1 drop at bedtime.   mirtazapine (REMERON) 45 MG tablet TAKE ONE TABLET BY MOUTH EVERYDAY AT BEDTIME   Multiple Vitamin (MULTIVITAMIN) capsule Take 1 capsule by mouth daily.   pantoprazole (PROTONIX) 40 MG tablet TAKE ONE TABLET BY MOUTH EVERY MORNING Needs appointment for further refills   valACYclovir (VALTREX) 500 MG tablet TAKE ONE TABLET BY MOUTH twice daily FOR 3 DAYS AS NEEDED FOR outbreaks   No facility-administered encounter medications on file as of 05/16/2022.   Care Gaps: Shinfrix Vaccine COVID-19 Vaccine (3- Pfizer risk series)   Star Rating Drugs: None ID   Medication Fill Gaps: None ID  Reviewed chart for medication changes ahead of medication coordination call.   BP Readings from Last 3 Encounters:  05/09/22 133/85  05/07/22 (!) 153/81  02/21/22 136/82    No results found for: "HGBA1C"   Patient obtains medications through Adherence Packaging  30 Days   Last adherence delivery included:  Aspirin 81 mg Daily - bedtime Vitamin D3 50 MCG Daily- Breakfast Donepezil 10 mg tablet Daily -Bedtime Finasteride 5 mg tablet Daily-Bedtime Mirtazapine 45 mg tablet Daily-Bedtime Pantoprazole 40 mg tablet Daily- Breakfast Multivitamin Capsule Daily-Breakfast  Latanoprost 0.005% Eye Drops -one drop in each eye at bedtime  Patient declined medications last month: Valacyclovir 500 mg -PRN  Restasis 0.05% two times Daily-Adequate supply  Patient is due for next adherence delivery on: 05/28/2022. Called patient  and reviewed medications and coordinated delivery.  This delivery to include: Aspirin 81 mg Daily - bedtime Vitamin D3 50 MCG Daily- Breakfast Donepezil 10 mg tablet Daily -Bedtime Finasteride 5 mg tablet  Daily-Bedtime Mirtazapine 45 mg tablet Daily-Bedtime Pantoprazole 40 mg tablet Daily- Breakfast Multivitamin Capsule Daily-Breakfast  Latanoprost 0.005% Eye Drops -one drop in each eye at bedtime    Patient declined the following medications: Valacyclovir 500 mg -PRN  Restasis 0.05% two times Daily-Adequate supply  Patient needs refills for None ID.  Confirmed delivery date of 05/28/2022 ([facility]), advised patient that pharmacy will contact them the morning of delivery.  [place] Pharmacist Assistant [phone]

## 2022-05-16 NOTE — ED Triage Notes (Signed)
Removed one suture from palm of rt hand. No redness or drainage noted.

## 2022-05-23 ENCOUNTER — Encounter: Payer: Self-pay | Admitting: Neurology

## 2022-05-23 ENCOUNTER — Ambulatory Visit: Payer: PPO | Admitting: Neurology

## 2022-05-23 VITALS — BP 141/79 | HR 86 | Ht 66.0 in | Wt 137.0 lb

## 2022-05-23 DIAGNOSIS — G3184 Mild cognitive impairment, so stated: Secondary | ICD-10-CM

## 2022-05-23 DIAGNOSIS — G4733 Obstructive sleep apnea (adult) (pediatric): Secondary | ICD-10-CM | POA: Diagnosis not present

## 2022-05-23 NOTE — Progress Notes (Signed)
[facility]  PATIENT: [name] DOB: May 27, 1935   REASON FOR VISIT: Follow-up obstructive sleep apnea on BiPAP HISTORY FROM: Patient alone.     HISTORY OF PRESENT ILLNESS:   05-23-2022: Here for 3 month BiPAP f/u. Pt continues to have issues with his mask. Has not been able to use his machine. RV [facility] returned to report he can't use the BiPAP with any mask tried, he loved his machine but can't find a fit.   His nasal and FFM gave him pressure marks, sleeping without his dentures allowed oral airflow.  He misses the therapy. He wants to give another try at [place]  [facility] to try a mask, may be going back to the nasal/ FFM. A Chinstrap was also tried.   02-21-2022: RV Interval history for [name]. [name], now 87 years-old sleep patient who was a complaint BiPAP user , he  now hasn't been nursing the PAP therapy for 3 months after developing pressure marks, he sleeps without dentures which fostered oral air leaks.  He is also concerned abut memory- MOCA is 25/ 30 , one point below normal and he lost 3 points in recall of words. Good trail making test. Short term memory impaired, but mildly so. He sometimes 9rarely)  has trouble recalling names. He does his own accounting.    08-31-2020- RV  Interval history for [name], an 87 year -old caucasian sleep patient.   He presents today with an 83% compliance 4 days and 78% compliance by hours using his machine on days used average 6 hours 19 minutes.  He is using IV although with an inspiratory pressure setting of 17 expiratory pressure setting of 13 and is a brief function, his residual AHI is 0.8 which speaks for an excellent resolution , 95th percentile air leak  is 11.4L/minute. he sleeps without dentures.  So I am very happy with his compliance and the results of his BiPAP therapy.  Upon the patient's request and concern about subjective memory loss he was today undergoing a Montreal cognitive  assessment.  This showed short-term memory trouble. Epworth at 6 points/ 24 .   He scored 26 out of 30 points on his [place] fully oriented able to abstract, calculate, name animals and perform visual-spatial maneuvers.  He had some trouble with trouble with the Trail Making Test which is common when encountered first time and he did not recall on first try 3 out of 5 of his recall words.   So this is fairly similar to his MMSE results which also were always showing short term memory difficulties for words only.     11/25/2016    8:44 AM 05/21/2016    8:56 AM 11/20/2015    8:51 AM 05/16/2015    9:00 AM  MMSE - Mini Mental State Exam  Orientation to time '5 5 5 5  '$ Orientation to Place '5 5 5 5  '$ Registration '3 3 3 3  '$ Attention/ Calculation '3 4 5 5  '$ Recall '2 3 3 3  '$ Language- name 2 objects '2 2 2 2  '$ Language- repeat '1 1 1 1  '$ Language- follow 3 step command '3 3 2 3  '$ Language- read & follow direction '1 1 1 1  '$ Write a sentence 1 1 0 1  Copy design '1 1 1 1  '$ Total score '27 29 28 30      '$ 02/21/2022    1:25 PM 08/31/2020    3:36 PM  Montreal  Cognitive Assessment   Visuospatial/ Executive (0/5) 4 4  Naming (0/3) 3 3  Attention: Read list of digits (0/2) 2 2  Attention: Read list of letters (0/1) 1 1  Attention: Serial 7 subtraction starting at 100 (0/3) 3 3  Language: Repeat phrase (0/2) 2 2  Language : Fluency (0/1) 0 1  Abstraction (0/2) 2 2  Delayed Recall (0/5) 2 2  Orientation (0/6) 6 6  Total 25 26  Adjusted Score (based on education) 25      01-19-2020:  I have the pleasure of seeing [name] he is an established BiPAP user and in his last visit was me we were concerned about her slowly rising apnea hypopnea index.  I adjusted the settings to currently 17/13 centimeters water pressure and his AHI is now 4.9 which is a satisfying result.  There are no central apneas emerging anymore.  He is highly compliant and on average he uses the machine for 7 hours and 30 minutes at  night, he is 87% compliant with his days he had a.  Of 3 days where he could not use the machine.  Overall previous months were at 100% compliance.  He has no major air leakage his fatigue score was endorsed at 10 out of 63 points his sleepiness score at 4 out of 24 points.  There is 0 there is 1 point on his depression score out of 15 possible points. He is living alone, has two adult children, a daughter and a son. He is sleeping less long , wakes up after 3-4 hours and would like an increase in meds. He has invested in Scientist, research (life sciences) estate and is not financially strained.  BP is elevated today. Aricept has been taken for 5 years upon recommendation of a pharmacist friend.    I have the pleasure of meeting today with [name], who has followed as here for BiPAP compliance for long time.  The patient presents Massey with excellent compliance of 97%, 29 out of 30 days with an average user time of 6 hours 35 minutes, inspiratory pressure is set to 13 expiratory pressure to 10 cmH2O with a residual AHI of 19.8 obstructive apneas.  There were no air leaks and I can really not explained this  high apnea number.  He reports that it is not troublesome for him to breathe, he is compliant because he likes to use his BiPAP, and I wonder if we have to increase his pressure by about 2 cmH2O.  Last AHI was 14/h.  He reports having GERD and related apneas are a possibility, he had esophageal stretching procedures with GI. He remains hoarse.  I like for him to have a new BiPAP titration.     UPDATE 05/28/2018 CM [name], 87 year old male returns for follow-up for BiPAP compliance.  He continues to have compliance greater than 4 hours at 83%.  Average usage 6 hours 5 minutes.  Set pressure 9 to 13 cm.  Leak 95th percentile 4.1 AHI 14.4/h  which is much improved from last visit.  No central apneas, his  ESS 4/ 24 .  He still says he is not sleeping enough that he did get a wedge and that has helped with his  GERD.   He returns for reevaluation  Interval history from 24 September 2017,CD I have the pleasure of meeting today with [name] after a one-year hiatus.  The patient has been followed for CPAP compliance in our office for several  years now.  He has a high compliance Massey 87% of the time, average use of time is 5 hours and 31 minutes, he is using a VPAP auto machine inspiratory pressure is set at 19 cm water expiratory pressure at 15, he does not have detectable air leaks but still high a number of obstructive apneas which has bothered me for years.  He has not noticed a difference between BiPAP and CPAP.  There has been a trend over the last 30 days to higher AHI is from about 10 on average each night to almost 40, and this is not corresponding to air leaks, usage time there has been no adjustment in pressure. The BiPAP's consistently good airflow pattern and data for respiratory rate contradict the measured residual AHI of over 30. He has severe GERD- could that be the  Artefact ? - yes, oesophageal pressure will induce a reading of OSA, he is hoarse , too.  He should change to an elevated pillow, wedge .     UPDATE 9/28 /2018CM [name], 87 year old male returns for follow-up with history of obstructive sleep apnea on BiPAP. He feels he is doing well however compliance data actually worse than 3 months ago. Data dated 07/02/2017 through 9/27/ 2018 Shows 90% compliance greater than 4 hours. Average usage 6 hours 18 minutes. Pressure set 13-17 cm. AHI 32.7 central apneas 0.3. Obstructive apneas 29.2. Patient also feels he has a Leak however this is not evident on the report.Marland Kitchen He is wondering if his Remeron can be increased. He returns for reevaluation  UPDATE 6/28/ 2018[name], 87 year old male returns for follow-up with history of obstructive sleep apnea. He was on CPAP when last seen he was switched to BiPAP due to high residual apnea index. He feels that he is doing well on BiPAP Compliance  data reviewed today for 30 days from 03/31/2017 2 04/29/2017 100% compliance for 30 days greater than 4 hours. Average usage 7 hours 14 minutes. Pressure set 16 cm/12 cm of pressure. AHI 21.3. Central apneas 0.3. Obstructive apneas 17.2. He returns to discuss this download.  01/30/17 MM- [name] is a 87 year old male with a history of memory disturbance and obstructive sleep apnea on CPAP. He returns today for a compliance download. At the last visit his pressure was increased. His download indicates that he uses his machine 60/60 days for compliance of 100%. He uses machine greater than 4 hours 32/60 days for compliance of 53%. On average he uses his machine 4 hours and 7 minutes. He is on auto set with a minimum pressure of 6 cm water and maximum pressure of 20 cm of water with EPR 2. His average pressure is 18.7 cm/m. His residual AHI is 25.1/h. He returns today to discuss his download.    11/25/2016: [name] is a 87 year old male with a history of memory disturbance and obstructive sleep apnea on CPAP. He returns today for follow-up. The patient's CPAP download indicates that he uses machine 28 out of 30 days for compliance of 93%. He only uses machine greater than 4 hour 12 out of 30 days for compliance of 40%. His residual AHI is 24.8 on a minimum pressure of 6cm of water with a maximum pressure 15 cm of water. His leak in the 95th percentile is 24.9 L/m. The patient's last download was approximately 2 years ago. At that time the download showed excellent treatment of his apnea. The patient feels that his memory has remained stable. He lives at home alone.  He is able to complete all ADLs independently. He operates a Teacher, music without difficulty. He is able to manage his finances and prepares all meals without difficulty. He returns today for an evaluation.   REVIEW OF SYSTEMS: Full 14 system review of systems performed and notable only for those listed, all others are neg:    Obstructive  sleep apnea, was treated with BiPAP:   05-23-2022 has not used bipap in a while, can t find a fitting mask.   On Aricept for subjective memory problems.   How likely are you to doze in the following situations: 0 = not likely, 1 = slight chance, 2 = moderate chance, 3 = high chance  Sitting and Reading? Watching Television? Sitting inactive in a public place (theater or meeting)? Lying down in the afternoon when circumstances permit? Sitting and talking to someone? Sitting quietly after lunch without alcohol? In a car, while stopped for a few minutes in traffic? As a passenger in a car for an hour without a break?  Total = 10/ 24 after 90 days off BiPAPs.   FSS at na  GDS at NA  No memory concerns.     ALLERGIES: Allergies  Allergen Reactions   Sulfamethoxazole Other (See Comments)    Constipation   Sulfamethoxazole-Trimethoprim     constipation    HOME MEDICATIONS: Outpatient Medications Prior to Visit  Medication Sig Dispense Refill   acetaminophen (TYLENOL) 325 MG tablet Take 2 tablets (650 mg total) by mouth every 6 (six) hours as needed for moderate pain. 30 tablet 0   amoxicillin-clavulanate (AUGMENTIN) 875-125 MG tablet Take 1 tablet by mouth 2 (two) times daily. 20 tablet 0   aspirin 81 MG tablet Take 81 mg by mouth daily.     Cholecalciferol (VITAMIN D3) 50 MCG (2000 UT) TABS Take 2,000 Units by mouth daily.     cycloSPORINE (RESTASIS) 0.05 % ophthalmic emulsion 1 drop 2 (two) times daily.      donepezil (ARICEPT) 10 MG tablet Take 1 tablet (10 mg total) by mouth at bedtime. Must keep follow up 02/21/22 for ongoing refills 30 tablet 5   finasteride (PROSCAR) 5 MG tablet Take 5 mg by mouth daily.     latanoprost (XALATAN) 0.005 % ophthalmic solution 1 drop at bedtime.     mirtazapine (REMERON) 45 MG tablet TAKE ONE TABLET BY MOUTH EVERYDAY AT BEDTIME 30 tablet 2   Multiple Vitamin (MULTIVITAMIN) capsule Take 1 capsule by mouth daily.     pantoprazole (PROTONIX)  40 MG tablet TAKE ONE TABLET BY MOUTH EVERY MORNING Needs appointment for further refills 30 tablet 11   valACYclovir (VALTREX) 500 MG tablet TAKE ONE TABLET BY MOUTH twice daily FOR 3 DAYS AS NEEDED FOR outbreaks 6 tablet 4   No facility-administered medications prior to visit.    PAST MEDICAL HISTORY: Past Medical History:  Diagnosis Date   Barrett's esophagus    Cataract    Diverticulosis of colon (without mention of hemorrhage)    Elevated PSA    Esophageal stricture    GERD (gastroesophageal reflux disease)    Glaucoma    Gout    Herpes    Hiatal hernia    History of colon cancer    HOH (hard of hearing)    Hyperlipidemia    Hypersomnia, persistent 11/24/2013   Reports Epworth of 10 and higher on CPAP with  good compliance.    Hypertrophy of prostate without urinary obstruction and other lower urinary tract symptoms (LUTS)  Impotence of organic origin    Insomnia    Memory change 04/19/2013   OSA on CPAP    Prostate cancer ([place])    Prostatitis    Stroke ([place])    tia in  2010   TIA (transient ischemic attack)    Vertigo     PAST SURGICAL HISTORY: Past Surgical History:  Procedure Laterality Date   EYE SURGERY     both cataracts   INTRACAPSULAR CATARACT EXTRACTION     Lt eye   MASS EXCISION N/A 07/29/2013   Procedure: MINOR EXCISION OF CHEST KELOID CONTRACTURE;  Surgeon: Theodoro Kos, DO;  Location: [facility] [facility];  Service: Plastics;  Laterality: N/A;  ACELL placement   MINOR GRAFT APPLICATION N/A 9/67/8938   Procedure: MINOR GRAFT APPLICATION A CELL;  Surgeon: Theodoro Kos, DO;  Location: [facility];  Service: Plastics;  Laterality: N/A;   REFRACTIVE SURGERY      FAMILY HISTORY: Family History  Problem Relation Age of Onset   Cirrhosis Mother    Jaundice Mother    Hyperlipidemia Father    Heart disease Father    Colon cancer Neg Hx    Esophageal cancer Neg Hx    Rectal cancer Neg Hx    Stomach cancer Neg Hx     SOCIAL  HISTORY: Social History   Socioeconomic History   Marital status: Single    Spouse name: Not on file   Number of children: 2   Years of education: 15   Highest education level: Not on file  Occupational History   Occupation: Retired  Tobacco Use   Smoking status: Former    Types: Cigarettes    Quit date: 07/23/1962    Years since quitting: 59.8   Smokeless tobacco: Never   Tobacco comments:    Stopped in 1963  Vaping Use   Vaping Use: Never used  Substance and Sexual Activity   Alcohol use: Yes    Comment: occasionally   Drug use: No   Sexual activity: Yes    Comment: 5 partners  Other Topics Concern   Not on file  Social History Narrative   Patient is single.   Patient has two children.   Patient is retired.   Patient has a college education.   Patient is right-handed.   Patient drinks about 3 cups of coffee daily.   Patient gets regular exercise.   Social Determinants of Health   Financial Resource Strain: Low Risk  (02/12/2022)   Overall Financial Resource Strain (CARDIA)    Difficulty of Paying Living Expenses: Not hard at all  Food Insecurity: No Food Insecurity (02/12/2022)   Hunger Vital Sign    Worried About Running Out of Food in the Last Year: Never true    Ran Out of Food in the Last Year: Never true  Transportation Needs: No Transportation Needs (02/12/2022)   PRAPARE - Hydrologist (Medical): No    Lack of Transportation (Non-Medical): No  Physical Activity: Sufficiently Active (02/12/2022)   Exercise Vital Sign    Days of Exercise per Week: 3 days    Minutes of Exercise per Session: 60 min  Stress: No Stress Concern Present (02/12/2022)   [place]    Feeling of Stress : Not at all  Social Connections: Socially Isolated (02/12/2022)   Social Connection and Isolation Panel [NHANES]    Frequency of Communication with Friends and Family: Twice a week  Frequency of Social Gatherings with Friends and Family: Twice a week    Attends Religious Services: Never    Marine scientist or Organizations: No    Attends Archivist Meetings: Never    Marital Status: Divorced  Human resources officer Violence: Not At Risk (02/12/2022)   Humiliation, Afraid, Rape, and Kick questionnaire    Fear of Current or Ex-Partner: No    Emotionally Abused: No    Physically Abused: No    Sexually Abused: No     PHYSICAL EXAM  Vitals:   05/23/22 1320  BP: (!) 141/79  Pulse: 86  Weight: 137 lb (62.1 kg)  Height: '5\' 6"'$  (1.676 m)   Body mass index is 22.11 kg/m.  Generalized: Well developed, in no acute distress  Head: normocephalic and atraumatic,  Oropharynx benign mallompatti 2-3 Neck: Supple, circumference 14 inches Musculoskeletal: No deformity  Skin no edema Neurological examination   Mentation: Alert oriented to time, place, history taking.  Attention span and concentration appropriate.  Recent and remote memory intact.- he is on aricept (!)   Follows all commands speech and language fluent.    Cranial nerve : .no loss of Taste, but slowly declining sense of smell .  Pupils were equal round reactive to light, with normal  extraocular movements were full, visual field were full on confrontational test.  Facial sensation and strength were normal.  Hearing remains impaired bilaterally.  Uvula and tongue moving in  in midline. head turning and shoulder shrug were normal and symmetric. Tongue protrusion into cheek strength was normal. Motor: equal bulk and tone, full strength in the BUE, BLE,  Sensory: symmetric to light touch,  Coordination: finger-nose-without dysmetria and without tremor.no  ataxia.  Gait and Station: Rising up from seated position without assistance, normal stance,  moderate stride, good arm swing, Smooth turning with 3 steps. DTR symmetric and 2 plus.Marland Kitchen   DIAGNOSTIC DATA (LABS, IMAGING, TESTING) - I reviewed  patient records, labs, notes, testing and imaging myself where available.  Lab Results  Component Value Date   WBC 8.5 05/29/2021   HGB 14.2 05/29/2021   HCT 42.7 05/29/2021   MCV 88.7 05/29/2021   PLT 294.0 05/29/2021      Component Value Date/Time   NA 140 05/29/2021 1447   NA 142 07/28/2017 1504   K 5.2 (H) 05/29/2021 1447   CL 103 05/29/2021 1447   CO2 29 05/29/2021 1447   GLUCOSE 65 (L) 05/29/2021 1447   BUN 16 05/29/2021 1447   BUN 14 07/28/2017 1504   CREATININE 0.90 05/29/2021 1447   CREATININE 0.78 07/24/2016 1432   CALCIUM 10.6 (H) 05/29/2021 1447   PROT 6.7 07/29/2019 1004   PROT 7.0 07/28/2017 1504   ALBUMIN 4.4 07/29/2019 1004   ALBUMIN 4.7 07/28/2017 1504   AST 18 07/29/2019 1004   ALT 16 07/29/2019 1004   ALKPHOS 81 07/29/2019 1004   BILITOT 0.7 07/29/2019 1004   BILITOT 0.5 07/28/2017 1504   GFRNONAA 79 07/28/2017 1504   GFRNONAA 87 04/18/2015 0826   GFRAA 92 07/28/2017 1504   GFRAA >89 04/18/2015 0826   Lab Results  Component Value Date   CHOL 174 07/29/2019   HDL 48.10 07/29/2019   LDLCALC 111 (H) 07/29/2019   LDLDIRECT 112.0 07/29/2019   TRIG 77.0 07/29/2019   CHOLHDL 4 07/29/2019    Lab Results  Component Value Date   [id number] 314 07/24/2016   Lab Results  Component Value Date   TSH 2.340 07/28/2017  ASSESSMENT AND PLAN:  [name] is   87 y.o. year-old male , who has only recently returned to the gym and feels deconditioned. His  past medical history is that of :  Obstructive sleep apnea on BiPAP,   subjective memory problems, now entering MCI range -  MOCA indicate  mild cognitive impairment, all trouble is short term memory.  and his pharmacist and friend recommended aricept and he has taken it for years. He didn't get COVID and is vaccinated now.   BiPAP data : I like to try a nasal pillow and chin strap for him, his dentures leave a large airleak when using FFM.   PLAN:   keep Aricept on board.   BIPAP -  Keep pressure to 17/13 cm. Will change to nasal cradle-  Chinstrap needed.  This patient's BiPAP machine was set up on 19 Mar 2017 so it is now 87 years old.  As he has had trouble to use the interface not so much the machine itself I would like to prepare to replace the machine anyway.  When he use CPAP with a pressure of 17 over 13 cm water pressure his residual AHI was 5.2 which was significant.  He did have air leakage as described by the patient himself-   I will once more ask him to be refitted for a mask since the nasal interfaces have not worked for him with or without chinstrap with or without dentures I think we do have to return to a full facemask probably and he agrees.  I will send him back to adapt.  I have only a few data from April of this year of his BiPAP use at the time compliance was 4% and the AHI was 14.9.  So I feel strongly that the patient needs to have a full facemask Massey in order to get some air seal.    So I am going to first set him up with new interface and then replace his for BiPAP machine.      Re- order supplies F/U in 2-4 months with Np or me- we need a months of good compliance for the replacement of the machine. Refilled Aricept and recommended to discuss REMERON increase with Dr Ethelene Hal.    [facility] [address] [place], [place] [address] [phone]

## 2022-05-23 NOTE — Progress Notes (Signed)
CM sent to [facility] for new order ?

## 2022-06-17 ENCOUNTER — Telehealth: Payer: Self-pay

## 2022-06-17 NOTE — Progress Notes (Signed)
Chronic Care Management Pharmacy Assistant   Name: [name]  MRN: [id number] DOB: 04-07-1935  Reason for Encounter: Medication Review/Medication Coordination Call.  Recent office visits:  No Recent office visit  Recent consult visits:  05/23/2022 Dr. [name] Fairy MD (Neurology) No Medication Changes noted  Hospital visits:  Medication Reconciliation was completed by comparing discharge summary, patient's EMR and Pharmacy list, and upon discussion with patient.  Admitted to the hospital on 05/16/2022 due to Suture/ Staple Removal. Discharge date was 05/16/2022. Discharged from [facility] [place]?Medications Started at [facility] Discharge:?? -started None ID  Medication Changes at Hospital Discharge: -Changed None ID  Medications Discontinued at Hospital Discharge: -Stopped None ID  Medications that remain the same after Hospital Discharge:??  -All other medications will remain the same.    Medications: Outpatient Encounter Medications as of 06/17/2022  Medication Sig   acetaminophen (TYLENOL) 325 MG tablet Take 2 tablets (650 mg total) by mouth every 6 (six) hours as needed for moderate pain.   amoxicillin-clavulanate (AUGMENTIN) 875-125 MG tablet Take 1 tablet by mouth 2 (two) times daily.   aspirin 81 MG tablet Take 81 mg by mouth daily.   Cholecalciferol (VITAMIN D3) 50 MCG (2000 UT) TABS Take 2,000 Units by mouth daily.   cycloSPORINE (RESTASIS) 0.05 % ophthalmic emulsion 1 drop 2 (two) times daily.    donepezil (ARICEPT) 10 MG tablet Take 1 tablet (10 mg total) by mouth at bedtime. Must keep follow up 02/21/22 for ongoing refills   finasteride (PROSCAR) 5 MG tablet Take 5 mg by mouth daily.   latanoprost (XALATAN) 0.005 % ophthalmic solution 1 drop at bedtime.   mirtazapine (REMERON) 45 MG tablet TAKE ONE TABLET BY MOUTH EVERYDAY AT BEDTIME   Multiple Vitamin (MULTIVITAMIN) capsule Take 1 capsule by mouth daily.   pantoprazole (PROTONIX) 40  MG tablet TAKE ONE TABLET BY MOUTH EVERY MORNING Needs appointment for further refills   valACYclovir (VALTREX) 500 MG tablet TAKE ONE TABLET BY MOUTH twice daily FOR 3 DAYS AS NEEDED FOR outbreaks   No facility-administered encounter medications on file as of 06/17/2022.    Care Gaps: Shinfrix Vaccine COVID-19 Vaccine (3- Pfizer risk series) Influenza Vaccine   Star Rating Drugs: None ID   Medication Fill Gaps: None ID  Reviewed chart for medication changes ahead of medication coordination call.  BP Readings from Last 3 Encounters:  05/23/22 (!) 141/79  05/09/22 133/85  05/07/22 (!) 153/81    No results found for: "HGBA1C"   Patient obtains medications through Adherence Packaging  30 Days   Last adherence delivery included:  Aspirin 81 mg Daily - bedtime Vitamin D3 50 MCG Daily- Breakfast Donepezil 10 mg tablet Daily -Bedtime Finasteride 5 mg tablet Daily-Bedtime Mirtazapine 45 mg tablet Daily-Bedtime Pantoprazole 40 mg tablet Daily- Breakfast Multivitamin Capsule Daily-Breakfast  Latanoprost 0.005% Eye Drops -one drop in each eye at bedtime  Patient declined medications last month Valacyclovir 500 mg -PRN  Restasis 0.05% two times Daily-Adequate supply  Patient is due for next adherence delivery on: 06/26/2022. Called patient and reviewed medications and coordinated delivery.   This delivery to include: Aspirin 81 mg Daily - bedtime Vitamin D3 50 MCG Daily- Breakfast Donepezil 10 mg tablet Daily -Bedtime Finasteride 5 mg tablet Daily-Bedtime Mirtazapine 45 mg tablet Daily-Bedtime Pantoprazole 40 mg tablet Daily- Breakfast Multivitamin Capsule Daily-Breakfast  Latanoprost 0.005% Eye Drops -one drop in each eye at bedtime   Patient declined the following medications Valacyclovir 500 mg -PRN  Restasis 0.05% two times Daily-Adequate supply  Patient needs refills for Finasteride.   Confirmed delivery date of 06/26/2022, advised patient that pharmacy will  contact them the morning of delivery.  [place] Pharmacist Assistant [phone]

## 2022-06-19 DIAGNOSIS — N401 Enlarged prostate with lower urinary tract symptoms: Secondary | ICD-10-CM | POA: Diagnosis not present

## 2022-06-19 DIAGNOSIS — R3915 Urgency of urination: Secondary | ICD-10-CM | POA: Diagnosis not present

## 2022-06-19 DIAGNOSIS — N5201 Erectile dysfunction due to arterial insufficiency: Secondary | ICD-10-CM | POA: Diagnosis not present

## 2022-06-19 DIAGNOSIS — C61 Malignant neoplasm of prostate: Secondary | ICD-10-CM | POA: Diagnosis not present

## 2022-06-20 ENCOUNTER — Telehealth: Payer: PPO

## 2022-06-24 ENCOUNTER — Emergency Department (HOSPITAL_COMMUNITY): Payer: PPO

## 2022-06-24 ENCOUNTER — Other Ambulatory Visit: Payer: Self-pay

## 2022-06-24 ENCOUNTER — Encounter (HOSPITAL_COMMUNITY): Payer: Self-pay | Admitting: Emergency Medicine

## 2022-06-24 ENCOUNTER — Emergency Department (HOSPITAL_COMMUNITY)
Admission: EM | Admit: 2022-06-24 | Discharge: 2022-06-24 | Disposition: A | Discharge status: Home / Self Care | Payer: PPO | Attending: Emergency Medicine | Admitting: Emergency Medicine

## 2022-06-24 DIAGNOSIS — J3489 Other specified disorders of nose and nasal sinuses: Secondary | ICD-10-CM | POA: Diagnosis not present

## 2022-06-24 DIAGNOSIS — R531 Weakness: Secondary | ICD-10-CM | POA: Insufficient documentation

## 2022-06-24 DIAGNOSIS — R000 Tachycardia, unspecified: Secondary | ICD-10-CM | POA: Insufficient documentation

## 2022-06-24 DIAGNOSIS — R29898 Other symptoms and signs involving the musculoskeletal system: Secondary | ICD-10-CM | POA: Diagnosis not present

## 2022-06-24 DIAGNOSIS — Z7982 Long term (current) use of aspirin: Secondary | ICD-10-CM | POA: Insufficient documentation

## 2022-06-24 DIAGNOSIS — Z8546 Personal history of malignant neoplasm of prostate: Secondary | ICD-10-CM | POA: Diagnosis not present

## 2022-06-24 DIAGNOSIS — R262 Difficulty in walking, not elsewhere classified: Secondary | ICD-10-CM | POA: Diagnosis not present

## 2022-06-24 DIAGNOSIS — G319 Degenerative disease of nervous system, unspecified: Secondary | ICD-10-CM | POA: Diagnosis not present

## 2022-06-24 LAB — CBG MONITORING, ED: Glucose-Capillary: 152 mg/dL — ABNORMAL HIGH (ref 70–99)

## 2022-06-24 LAB — CBC WITH DIFFERENTIAL/PLATELET
Abs Immature Granulocytes: 0.05 10*3/uL (ref 0.00–0.07)
Basophils Absolute: 0 10*3/uL (ref 0.0–0.1)
Basophils Relative: 0 %
Eosinophils Absolute: 0 10*3/uL (ref 0.0–0.5)
Eosinophils Relative: 0 %
HCT: 42.3 % (ref 39.0–52.0)
Hemoglobin: 14 g/dL (ref 13.0–17.0)
Immature Granulocytes: 0 %
Lymphocytes Relative: 4 %
Lymphs Abs: 0.4 10*3/uL — ABNORMAL LOW (ref 0.7–4.0)
MCH: 29.5 pg (ref 26.0–34.0)
MCHC: 33.1 g/dL (ref 30.0–36.0)
MCV: 89.2 fL (ref 80.0–100.0)
Monocytes Absolute: 1.2 10*3/uL — ABNORMAL HIGH (ref 0.1–1.0)
Monocytes Relative: 9 %
Neutro Abs: 10.7 10*3/uL — ABNORMAL HIGH (ref 1.7–7.7)
Neutrophils Relative %: 87 %
Platelets: 290 10*3/uL (ref 150–400)
RBC: 4.74 MIL/uL (ref 4.22–5.81)
RDW: 13 % (ref 11.5–15.5)
WBC: 12.3 10*3/uL — ABNORMAL HIGH (ref 4.0–10.5)
nRBC: 0 % (ref 0.0–0.2)

## 2022-06-24 LAB — COMPREHENSIVE METABOLIC PANEL
ALT: 16 U/L (ref 0–44)
AST: 23 U/L (ref 15–41)
Albumin: 3.7 g/dL (ref 3.5–5.0)
Alkaline Phosphatase: 74 U/L (ref 38–126)
Anion gap: 6 (ref 5–15)
BUN: 19 mg/dL (ref 8–23)
CO2: 27 mmol/L (ref 22–32)
Calcium: 10.1 mg/dL (ref 8.9–10.3)
Chloride: 106 mmol/L (ref 98–111)
Creatinine, Ser: 0.97 mg/dL (ref 0.61–1.24)
GFR, Estimated: >60 mL/min (ref >60)
Glucose, Bld: 165 mg/dL — ABNORMAL HIGH (ref 70–99)
Potassium: 4.3 mmol/L (ref 3.5–5.1)
Sodium: 139 mmol/L (ref 135–145)
Total Bilirubin: 1 mg/dL (ref 0.3–1.2)
Total Protein: 6.9 g/dL (ref 6.5–8.1)

## 2022-06-24 LAB — URINALYSIS, ROUTINE W REFLEX MICROSCOPIC
Bilirubin Urine: NEGATIVE
Glucose, UA: 50 mg/dL — AB
Hgb urine dipstick: NEGATIVE
Ketones, ur: 20 mg/dL — AB
Leukocytes,Ua: NEGATIVE
Nitrite: NEGATIVE
Protein, ur: NEGATIVE mg/dL
Specific Gravity, Urine: 1.03 (ref 1.005–1.030)
pH: 5 (ref 5.0–8.0)

## 2022-06-24 MED ORDER — SODIUM CHLORIDE 0.9 % IV BOLUS
1000.0000 mL | Freq: Once | INTRAVENOUS | Status: AC
  Administered 2022-06-24: 1000 mL via INTRAVENOUS

## 2022-06-24 NOTE — ED Provider Notes (Signed)
[facility] EMERGENCY DEPARTMENT Provider Note   CSN: [id number] Arrival date & time: 06/24/22  0569     History  Chief Complaint  Patient presents with   Weakness    [name] is a 87 y.o. male.  Patient is an 87 year old male with a past medical history of CVA with no residual deficits, prostate cancer, and GERD presenting to the emergency department for difficulty ambulating.  The patient states that before going to bed last night he felt very shaky but when he woke up this morning his shakiness had resolved.  He states he got dressed to go walk with his friends this morning and noted that he was having difficulty putting on his pants.  He states that he does not believe this was due to weakness but felt maybe like his coordination was off.  He states then while walking with his friends they noted that he was walking with a shuffling gait and was not walking his study is normal and recommended that he come to the ER.  He denies any numbness or tingling, weakness, dizziness, vision changes.  He states he has otherwise been feeling well with no recent fevers, nausea, vomiting or diarrhea, dysuria or hematuria.  He states he has been taking his medications as prescribed.  The history is provided by the patient and the EMS personnel.  Weakness      Home Medications Prior to Admission medications   Medication Sig Start Date End Date Taking? Authorizing Provider  acetaminophen (TYLENOL) 325 MG tablet Take 2 tablets (650 mg total) by mouth every 6 (six) hours as needed for moderate pain. 05/07/22   Jaynee Eagles, PA-C  amoxicillin-clavulanate (AUGMENTIN) 875-125 MG tablet Take 1 tablet by mouth 2 (two) times daily. 05/07/22   Jaynee Eagles, PA-C  aspirin 81 MG tablet Take 81 mg by mouth daily.    [provider]  Cholecalciferol (VITAMIN D3) 50 MCG (2000 UT) TABS Take 2,000 Units by mouth daily.    [provider]  cycloSPORINE (RESTASIS) 0.05 %  ophthalmic emulsion 1 drop 2 (two) times daily.     [provider]  donepezil (ARICEPT) 10 MG tablet Take 1 tablet (10 mg total) by mouth at bedtime. Must keep follow up 02/21/22 for ongoing refills 02/21/22   Dohmeier, Asencion Partridge, MD  finasteride (PROSCAR) 5 MG tablet Take 5 mg by mouth daily.    [provider]  latanoprost (XALATAN) 0.005 % ophthalmic solution 1 drop at bedtime. 09/14/20   [provider]  mirtazapine (REMERON) 45 MG tablet TAKE ONE TABLET BY MOUTH EVERYDAY AT BEDTIME 05/13/22   Libby Maw, MD  Multiple Vitamin (MULTIVITAMIN) capsule Take 1 capsule by mouth daily.    [provider]  pantoprazole (PROTONIX) 40 MG tablet TAKE ONE TABLET BY MOUTH EVERY MORNING Needs appointment for further refills 01/15/22   Ladene Artist, MD  valACYclovir (VALTREX) 500 MG tablet TAKE ONE TABLET BY MOUTH twice daily FOR 3 DAYS AS NEEDED FOR outbreaks 12/24/21   Libby Maw, MD      Allergies    Sulfamethoxazole and Sulfamethoxazole-trimethoprim    Review of Systems   Review of Systems  Neurological:  Positive for weakness.    Physical Exam Updated Vital Signs BP 130/67   Pulse 94   Temp 97.9 F (36.6 C) (Oral)   Resp 18   Ht '5\' 6"'$  (1.676 m)   Wt 63.5 kg   SpO2 95%   BMI 22.60 kg/m  Physical Exam Vitals and nursing note reviewed.  Constitutional:      General: He is not in acute distress.    Appearance: Normal appearance.  HENT:     Head: Normocephalic and atraumatic.     Nose: Nose normal.     Mouth/Throat:     Mouth: Mucous membranes are moist.     Pharynx: Oropharynx is clear.  Eyes:     Extraocular Movements: Extraocular movements intact.     Conjunctiva/sclera: Conjunctivae normal.     Pupils: Pupils are equal, round, and reactive to light.  Cardiovascular:     Rate and Rhythm: Regular rhythm. Tachycardia present.     Pulses: Normal pulses.     Heart sounds: Normal heart sounds.  Pulmonary:     Effort:  Pulmonary effort is normal.     Breath sounds: Normal breath sounds.  Abdominal:     General: Abdomen is flat.     Palpations: Abdomen is soft.     Tenderness: There is no abdominal tenderness.  Musculoskeletal:        General: Normal range of motion.     Cervical back: Normal range of motion and neck supple.  Skin:    General: Skin is warm and dry.  Neurological:     Mental Status: He is alert and oriented to person, place, and time.     Cranial Nerves: No cranial nerve deficit.     Sensory: No sensory deficit.     Motor: No weakness.     Coordination: Coordination normal.     Gait: Gait normal.  Psychiatric:        Mood and Affect: Mood normal.        Behavior: Behavior normal.     ED Results / Procedures / Treatments   Labs (all labs ordered are listed, but only abnormal results are displayed) Labs Reviewed  URINALYSIS, ROUTINE W REFLEX MICROSCOPIC - Abnormal; Notable for the following components:      Result Value   Color, Urine AMBER (*)    APPearance HAZY (*)    Glucose, UA 50 (*)    Ketones, ur 20 (*)    All other components within normal limits  COMPREHENSIVE METABOLIC PANEL - Abnormal; Notable for the following components:   Glucose, Bld 165 (*)    All other components within normal limits  CBC WITH DIFFERENTIAL/PLATELET - Abnormal; Notable for the following components:   WBC 12.3 (*)    Neutro Abs 10.7 (*)    Lymphs Abs 0.4 (*)    Monocytes Absolute 1.2 (*)    All other components within normal limits  CBG MONITORING, ED - Abnormal; Notable for the following components:   Glucose-Capillary 152 (*)    All other components within normal limits    EKG EKG Interpretation  Date/Time:  Monday June 24 2022 08:19:37 EDT Ventricular Rate:  107 PR Interval:  242 QRS Duration: 75 QT Interval:  301 QTC Calculation: 402 R Axis:   8 Text Interpretation: Sinus tachycardia Prolonged PR interval Low voltage, precordial leads Abnormal R-wave progression, early  transition First degree A-V block New since previous tracing Confirmed by Oneal Deputy [phone]) on 06/24/2022 9:58:55 AM  Radiology MR BRAIN WO CONTRAST  Result Date: 06/24/2022 CLINICAL DATA:  Provided history: Transient ischemic attack. Additional history provided: Right-sided weakness when walking. History of stroke. History of prostate cancer. EXAM: MRI HEAD WITHOUT CONTRAST TECHNIQUE: Multiplanar, multiecho pulse sequences of the brain and surrounding structures were obtained without intravenous contrast. COMPARISON:  Prior  head CT examinations 06/24/2022 and earlier. Report from brain MRI 04/26/2013 (images unavailable). FINDINGS: Brain: Mild generalized parenchymal atrophy. Moderate multifocal T2 FLAIR hyperintense signal abnormality within the cerebral white matter, nonspecific but compatible with chronic small vessel ischemic disease. Punctate focus of susceptibility-weighted signal loss within the anterior right temporal lobe compatible with a chronic microhemorrhage. There is no acute infarct. No evidence of an intracranial mass. No extra-axial fluid collection. No midline shift. Vascular: Maintained flow voids within the proximal large arterial vessels. Skull and upper cervical spine: No focal suspicious marrow lesion. Sinuses/Orbits: No mass or acute finding within the imaged orbits. Prior bilateral ocular lens replacement. Minimal mucosal thickening versus fluid within the bilateral maxillary and left sphenoid sinuses. Scattered mucosal thickening and fluid within the bilateral ethmoid air cells, overall mild-to-moderate. IMPRESSION: No evidence of acute intracranial abnormality. Moderate chronic small vessel ischemic changes within the cerebral white matter. Mild generalized parenchymal atrophy. Paranasal sinus disease, as described. Electronically Signed   By: Kellie Simmering D.O.   On: 06/24/2022 12:21   CT HEAD WO CONTRAST  Result Date: 06/24/2022 CLINICAL DATA:  TIA, weakness. EXAM: CT  HEAD WITHOUT CONTRAST TECHNIQUE: Contiguous axial images were obtained from the base of the skull through the vertex without intravenous contrast. RADIATION DOSE REDUCTION: This exam was performed according to the departmental dose-optimization program which includes automated exposure control, adjustment of the mA and/or kV according to patient size and/or use of iterative reconstruction technique. COMPARISON:  CT head from 2014. FINDINGS: Brain: No evidence of acute infarction, hemorrhage, hydrocephalus, extra-axial collection or mass lesion/mass effect. Vascular: No hyperdense vessel or unexpected calcification. Skull: Normal. Negative for fracture or focal lesion. Sinuses/Orbits: Scattered ethmoid opacification similar to previous imaging. Mastoid air cells are clear. No acute orbital findings. Other: None IMPRESSION: 1. No acute intracranial abnormality. 2. Scattered ethmoid opacification similar to previous imaging. Electronically Signed   By: Zetta Bills M.D.   On: 06/24/2022 09:00    Procedures Procedures    Medications Ordered in ED Medications  sodium chloride 0.9 % bolus 1,000 mL (0 mLs Intravenous Stopped 06/24/22 1135)    ED Course/ Medical Decision Making/ A&P Clinical Course as of 06/24/22 1425  Mon Jun 24, 2022  1011 Labs reviewed and interpreted by myself show mildly elevated white count of 12.3.  Urine is pending.  He denies any other infectious symptoms.  CT head reviewed with no acute findings.  Due to the patient's transient difficulty with ambulation, I am concerned for possible TIA and he will have an MRI of the brain performed. [VK]  3295 Upon reassessment, the patient's MRI is unremarkable for acute disease.  The patient has not yet been able to urinate despite fluids and will attempt urination at bedside and otherwise will require bladder scan and possible straight cath.  He states that he had previously been urinating normally and has been able to empty his bladder  completely. [VK]  1422 Urine had ketones consistent with mild dehydration but no signs of UTI and no other signs of infectious etiology on his exam.  Patient has been able to tolerate p.o. and did receive 1 L of IV fluids here.  Patient is overall well-appearing and is stable for discharge home.  He was recommended primary care follow-up and given strict return precautions.  Patient and his daughter's questions were answered and they were agreeable with the plan. [VK]    Clinical Course User Index [VK] Ottie Glazier, DO  Medical Decision Making This patient presents to the ED with chief complaint(s) of difficulty with ambulation and coordination with pertinent past medical history of CVA with no residual deficits prostate cancer which further complicates the presenting complaint. The complaint involves an extensive differential diagnosis and also carries with it a high risk of complications and morbidity.    The differential diagnosis includes patient does not currently have any focal neurologic deficits, so considering possible TIA, electrolyte abnormality, infection, hypo or hyperglycemia, arrhythmia  Additional history obtained: Additional history obtained from family   Independent labs interpretation:  The following labs were independently interpreted:  Mildly elevated white count, mild ketones in the urine, no obvious source of infection, labs otherwise within normal range  Independent visualization of imaging: - I independently visualized the following imaging with scope of interpretation limited to determining acute life threatening conditions related to emergency care: CT head, MRI brain, which revealed no acute findings  Consultation: - Consulted or discussed management/test interpretation w/ external professional: N/A  Consideration for admission or further workup: Patient has no acute focal neurologic deficits and is overall well-appearing,  tolerating p.o. and does not have any emergent conditions requiring admission at this time.  The patient stable for discharge with outpatient primary care follow-up. Social Determinants of health: N/A    Amount and/or Complexity of Data Reviewed Labs: ordered. Radiology: ordered.           Final Clinical Impression(s) / ED Diagnoses Final diagnoses:  Weakness    Rx / DC Orders ED Discharge Orders     None         Ottie Glazier, DO 06/24/22 1426

## 2022-06-24 NOTE — Discharge Instructions (Signed)
You were seen in the emergency department for your weakness and difficulty with walking.  You had no signs of stroke on your exam or on your MRI here in the emergency department.  You had no signs of urinary tract infection or other infection.  You did appear mildly dehydrated and we did give you fluids through the IV and you were able to eat and drink while you are in the emergency department.  It is unclear what exactly was contributing to your symptoms in the ER today you should follow-up with your primary doctor to have your symptoms rechecked to see if you need further work-up done as an outpatient.  You should return to the emergency department if you are having worsening weakness on one side of the body compared to the other or numbness on one side of the body compared to the other, difficulty talking, you are unable to walk, you pass out, or if you have any other new or concerning symptoms.

## 2022-06-24 NOTE — ED Triage Notes (Signed)
Pt BIB [facility] from home due to weakness on right side.  Pt walks every other day with friends and they noticed right side weakness when walking.   Hx of stroke.  Hx of prostate cancer.  VS BP 135/86, CBG 200, HR 108.  18g  of left AC.

## 2022-06-24 NOTE — ED Notes (Signed)
Patient transported to MRI, family at bedside aware of plan. Patient A&Ox4, comfortable at this time.

## 2022-06-25 ENCOUNTER — Telehealth: Payer: Self-pay

## 2022-06-25 NOTE — Telephone Encounter (Signed)
Transition Care Management Unsuccessful Follow-up Telephone Call  Date of discharge and from where:  [name]  Attempts:  1st Attempt  Reason for unsuccessful TCM follow-up call:  Left voice message

## 2022-07-01 ENCOUNTER — Telehealth: Payer: Self-pay

## 2022-07-01 NOTE — Telephone Encounter (Signed)
LVM to schedule TOC to Dr. Gena Fray. (Last OV with Dr. Ethelene Hal was 05/29/21.)

## 2022-07-01 NOTE — Telephone Encounter (Signed)
Transition Care Management Follow-up Telephone Call Date of discharge and from where: [facility] How have you been since you were released from the hospital? Doing good Any questions or concerns? No  Items Reviewed: Did the pt receive and understand the discharge instructions provided? Yes  Medications obtained and verified? N/A Other?  N/A Any new allergies since your discharge? No  Dietary orders reviewed? Yes Do you have support at home? Yes   Home Care and Equipment/Supplies: Were home health services ordered? not applicable If so, what is the name of the agency? N/A  Has the agency set up a time to come to the patient's home? not applicable Were any new equipment or medical supplies ordered?  No What is the name of the medical supply agency? N/A Were you able to get the supplies/equipment? not applicable Do you have any questions related to the use of the equipment or supplies? No  Functional Questionnaire: (I = Independent and D = Dependent) ADLs: D  Bathing/Dressing- D  Meal Prep- D  Eating- D  Maintaining continence- D  Transferring/Ambulation- D  Managing Meds- D  Follow up appointments reviewed:  PCP Hospital f/u appt confirmed? No  Scheduled to see N/A on N/A @ N/A. Pt is requesting to transfer care to Dr. Gena Fray. Transfer Of Care appt scheduled for 07/25/2022 @ 9:20a with [facility] f/u appt confirmed?  N/A  Scheduled to see N/A on N/A @ N/A. Are transportation arrangements needed? No  If their condition worsens, is the pt aware to call PCP or go to the Emergency Dept.? Yes Was the patient provided with contact information for the PCP's office or ED? Yes Was to pt encouraged to call back with questions or concerns? Yes  Angeline Slim, RN, BSN

## 2022-07-16 ENCOUNTER — Telehealth: Payer: Self-pay

## 2022-07-16 NOTE — Progress Notes (Signed)
Chronic Care Management Pharmacy Assistant   Name: [name]  MRN: [id number] DOB: November 02, 1935  Reason for Encounter: Medication Review/Medication Coordination Call.   Recent office visits:  No recent office visit  Recent consult visits:  No recent consult Visit  Hospital visits:  Medication Reconciliation was completed by comparing discharge summary, patient's EMR and Pharmacy list, and upon discussion with patient.  Admitted to the hospital on 06/24/2022 due to Weakness. Discharge date was 06/24/2022. Discharged from [place]?Medications Started at [facility] Discharge:?? -started None ID  Medication Changes at Hospital Discharge: -Changed None ID  Medications Discontinued at Hospital Discharge: -Stopped None ID  Medications that remain the same after Hospital Discharge:??  -All other medications will remain the same.    Medications: Outpatient Encounter Medications as of 07/16/2022  Medication Sig   acetaminophen (TYLENOL) 325 MG tablet Take 2 tablets (650 mg total) by mouth every 6 (six) hours as needed for moderate pain.   amoxicillin-clavulanate (AUGMENTIN) 875-125 MG tablet Take 1 tablet by mouth 2 (two) times daily.   aspirin 81 MG tablet Take 81 mg by mouth daily.   Cholecalciferol (VITAMIN D3) 50 MCG (2000 UT) TABS Take 2,000 Units by mouth daily.   cycloSPORINE (RESTASIS) 0.05 % ophthalmic emulsion 1 drop 2 (two) times daily.    donepezil (ARICEPT) 10 MG tablet Take 1 tablet (10 mg total) by mouth at bedtime. Must keep follow up 02/21/22 for ongoing refills   finasteride (PROSCAR) 5 MG tablet Take 5 mg by mouth daily.   latanoprost (XALATAN) 0.005 % ophthalmic solution 1 drop at bedtime.   mirtazapine (REMERON) 45 MG tablet TAKE ONE TABLET BY MOUTH EVERYDAY AT BEDTIME   Multiple Vitamin (MULTIVITAMIN) capsule Take 1 capsule by mouth daily.   pantoprazole (PROTONIX) 40 MG tablet TAKE ONE TABLET BY MOUTH EVERY MORNING Needs  appointment for further refills   valACYclovir (VALTREX) 500 MG tablet TAKE ONE TABLET BY MOUTH twice daily FOR 3 DAYS AS NEEDED FOR outbreaks   No facility-administered encounter medications on file as of 07/16/2022.    Care Gaps: Shinfrix Vaccine COVID-19 Vaccine (3- Pfizer risk series) Influenza Vaccine   Star Rating Drugs: None ID   Medication Fill Gaps: None ID   Reviewed chart for medication changes ahead of medication coordination call.   BP Readings from Last 3 Encounters:  06/24/22 130/67  05/23/22 (!) 141/79  05/09/22 133/85    No results found for: "HGBA1C"   Patient obtains medications through Adherence Packaging  30 Days   Last adherence delivery included:  Aspirin 81 mg Daily - bedtime Vitamin D3 50 MCG Daily- Breakfast Donepezil 10 mg tablet Daily -Bedtime Finasteride 5 mg tablet Daily-Bedtime Mirtazapine 45 mg tablet Daily-Bedtime Pantoprazole 40 mg tablet Daily- Breakfast Multivitamin Capsule Daily-Breakfast  Latanoprost 0.005% Eye Drops -one drop in each eye at bedtime  Patient declined medication last month: Valacyclovir 500 mg -PRN  Restasis 0.05% two times Daily-Adequate supply  Patient is due for next adherence delivery on: 07/25/2022. Called patient and reviewed medications and coordinated delivery.  This delivery to include: Aspirin 81 mg Daily - bedtime Vitamin D3 50 MCG Daily- Breakfast Donepezil 10 mg tablet Daily -Bedtime Finasteride 5 mg tablet Daily-Bedtime Mirtazapine 45 mg tablet Daily-Bedtime Pantoprazole 40 mg tablet Daily- Breakfast Multivitamin Capsule Daily-Breakfast   Patient declined the following medications: Latanoprost 0.005% Eye Drops -one drop in each eye at bedtime - Adequate supply Valacyclovir 500 mg -PRN  Restasis 0.05% two times  Daily-Adequate supply  Patient needs refills for Finasteride.  Confirmed delivery date of 07/25/2022 ([facility]), advised patient that pharmacy will contact them the morning of  delivery.  [place] Pharmacist Assistant [phone]

## 2022-07-24 ENCOUNTER — Encounter: Payer: Self-pay | Admitting: Family Medicine

## 2022-07-25 ENCOUNTER — Ambulatory Visit (INDEPENDENT_AMBULATORY_CARE_PROVIDER_SITE_OTHER): Payer: PPO | Admitting: Family Medicine

## 2022-07-25 ENCOUNTER — Encounter: Payer: Self-pay | Admitting: Family Medicine

## 2022-07-25 VITALS — BP 116/64 | HR 92 | Temp 97.2°F | Ht 66.0 in | Wt 134.6 lb

## 2022-07-25 DIAGNOSIS — E559 Vitamin D deficiency, unspecified: Secondary | ICD-10-CM | POA: Insufficient documentation

## 2022-07-25 DIAGNOSIS — C61 Malignant neoplasm of prostate: Secondary | ICD-10-CM

## 2022-07-25 DIAGNOSIS — G459 Transient cerebral ischemic attack, unspecified: Secondary | ICD-10-CM | POA: Insufficient documentation

## 2022-07-25 DIAGNOSIS — Z23 Encounter for immunization: Secondary | ICD-10-CM

## 2022-07-25 DIAGNOSIS — R739 Hyperglycemia, unspecified: Secondary | ICD-10-CM | POA: Diagnosis not present

## 2022-07-25 DIAGNOSIS — G3184 Mild cognitive impairment, so stated: Secondary | ICD-10-CM | POA: Diagnosis not present

## 2022-07-25 DIAGNOSIS — K219 Gastro-esophageal reflux disease without esophagitis: Secondary | ICD-10-CM

## 2022-07-25 DIAGNOSIS — F341 Dysthymic disorder: Secondary | ICD-10-CM

## 2022-07-25 LAB — BASIC METABOLIC PANEL
BUN: 14 mg/dL (ref 6–23)
CO2: 30 mEq/L (ref 19–32)
Calcium: 10.6 mg/dL — ABNORMAL HIGH (ref 8.4–10.5)
Chloride: 106 mEq/L (ref 96–112)
Creatinine, Ser: 0.89 mg/dL (ref 0.40–1.50)
GFR: 78.35 mL/min (ref >60.00)
Glucose, Bld: 95 mg/dL (ref 70–99)
Potassium: 4.7 mEq/L (ref 3.5–5.1)
Sodium: 142 mEq/L (ref 135–145)

## 2022-07-25 LAB — CBC
HCT: 42 % (ref 39.0–52.0)
Hemoglobin: 14 g/dL (ref 13.0–17.0)
MCHC: 33.3 g/dL (ref 30.0–36.0)
MCV: 88.1 fl (ref 78.0–100.0)
Platelets: 229 10*3/uL (ref 150.0–400.0)
RBC: 4.77 Mil/uL (ref 4.22–5.81)
RDW: 14.8 % (ref 11.5–15.5)
WBC: 6.2 10*3/uL (ref 4.0–10.5)

## 2022-07-25 NOTE — Progress Notes (Signed)
[facility] [address] [place] [address] Dept: [phone] Dept Fax: [phone]  Transfer of Care Office Visit  Subjective:    Patient ID: [name], male    DOB: October 02, 1935, 87 y.o..   MRN: [id number]  Chief Complaint  Patient presents with   [place] care.   No concerns.  Fasting today.  Wants flu shot     History of Present Illness:  Patient is in today to establish care. Mr. Gersten was born in [place], [place]. His family moved around a great deal as a child. He moved to [place] in 1959. He was married for a time, but has been divorced since 102. He has two children, a son (48) and a daughter (73). He has two grandchildren. 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 He denies use of tobacco or drugs and only rarely drinks alcohol.  Mr. Shillingford has a history of GERD. He is managed on Protonix with good control.  Mr. Seever has a history of mild cognitive impairment. He is managed on Aricept.  Mr. Howdeshell has a history of dysthymia. He is managed on mirtazapine and apparently this is working well for him.  Mr. Carreira has a history of prostate cancer. He has not had surgery, chemo-, or radiation. He notes Dr. Diona Fanti has been monitoring this and performed periodic biopsies for this.  Past Medical History: Patient Active Problem List   Diagnosis Date Noted   MCI (mild cognitive impairment) 05/23/2022   Prostate cancer ([place]) 08/31/2020   Vitiligo 08/08/2020   History of gout 08/08/2020   Dysthymia 02/07/2020   Elevated LDL cholesterol level 07/29/2019   Gout 07/29/2018   Obstructive sleep apnea treated with bilevel positive airway pressure (BiPAP) 05/01/2017   Sensorineural hearing loss (SNHL), bilateral 12/22/2015   Hypersomnia, persistent 11/24/2013   Insomnia, persistent 07/22/2013   Abnormal chest percussion 07/22/2013   Subjective vision disturbance  04/19/2013   Loss of weight 11/13/2011   Anal or rectal pain 11/13/2011   Personal history of colonic polyps 11/13/2011   HSV 02/16/2010   Dry eye syndrome 02/16/2010   Gastroesophageal reflux disease 02/16/2010   Past Surgical History:  Procedure Laterality Date   EYE SURGERY     both cataracts   INTRACAPSULAR CATARACT EXTRACTION     Lt eye   MASS EXCISION N/A 07/29/2013   Procedure: MINOR EXCISION OF CHEST KELOID CONTRACTURE;  Surgeon: Theodoro Kos, DO;  Location: [facility];  Service: Plastics;  Laterality: N/A;  ACELL placement   MINOR GRAFT APPLICATION N/A 2/59/5638   Procedure: MINOR GRAFT APPLICATION A CELL;  Surgeon: Theodoro Kos, DO;  Location: [facility];  Service: Plastics;  Laterality: N/A;   REFRACTIVE SURGERY     Family History  Problem Relation Age of Onset   Cirrhosis Mother    Jaundice Mother    Cancer Father        Throat   Hyperlipidemia Father    Heart disease Father    Colon cancer Neg Hx    Esophageal cancer Neg Hx    Rectal cancer Neg Hx    Stomach cancer Neg Hx    Outpatient Medications Prior to Visit  Medication Sig Dispense Refill   acetaminophen (TYLENOL) 325 MG tablet Take 2 tablets (650 mg total) by mouth every 6 (six) hours as needed for moderate pain. 30 tablet 0   aspirin 81 MG tablet Take 81 mg by  mouth daily.     Cholecalciferol (VITAMIN D3) 50 MCG (2000 UT) TABS Take 2,000 Units by mouth daily.     cycloSPORINE (RESTASIS) 0.05 % ophthalmic emulsion 1 drop 2 (two) times daily.      donepezil (ARICEPT) 10 MG tablet Take 1 tablet (10 mg total) by mouth at bedtime. Must keep follow up 02/21/22 for ongoing refills 30 tablet 5   finasteride (PROSCAR) 5 MG tablet Take 5 mg by mouth daily.     latanoprost (XALATAN) 0.005 % ophthalmic solution 1 drop at bedtime.     mirtazapine (REMERON) 45 MG tablet TAKE ONE TABLET BY MOUTH EVERYDAY AT BEDTIME 30 tablet 2   Multiple Vitamin (MULTIVITAMIN) capsule Take 1 capsule by  mouth daily.     pantoprazole (PROTONIX) 40 MG tablet TAKE ONE TABLET BY MOUTH EVERY MORNING Needs appointment for further refills 30 tablet 11   valACYclovir (VALTREX) 500 MG tablet TAKE ONE TABLET BY MOUTH twice daily FOR 3 DAYS AS NEEDED FOR outbreaks 6 tablet 4   amoxicillin-clavulanate (AUGMENTIN) 875-125 MG tablet Take 1 tablet by mouth 2 (two) times daily. 20 tablet 0   No facility-administered medications prior to visit.   Allergies  Allergen Reactions   Sulfamethoxazole Other (See Comments)    Constipation   Sulfamethoxazole-Trimethoprim     constipation     Objective:   Today's Vitals   07/25/22 0820  BP: 116/64  Pulse: 92  Temp: (!) 97.2 F (36.2 C)  TempSrc: Temporal  SpO2: 96%  Weight: 134 lb 9.6 oz (61.1 kg)  Height: '5\' 6"'$  (1.676 m)   Body mass index is 21.73 kg/m.   General: Well developed, well nourished. No acute distress. Psych: Alert and oriented. Normal mood and affect.  Health Maintenance Due  Topic Date Due   Zoster Vaccines- Shingrix (2 of 2) 08/05/2018   INFLUENZA VACCINE  06/04/2022        07/25/2022    8:44 AM 02/12/2022    9:02 AM 02/12/2022    9:01 AM  Depression screen PHQ 2/9  Decreased Interest 1 0 0  Down, Depressed, Hopeless 0 0 0  PHQ - 2 Score 1 0 0  Altered sleeping 0    Tired, decreased energy 0    Change in appetite 1    Feeling bad or failure about yourself  0    Trouble concentrating 1    Moving slowly or fidgety/restless 0    Suicidal thoughts 0    PHQ-9 Score 3    Difficult doing work/chores Not difficult at all     Imaging: MR Brain wo contrast (06/24/2022) IMPRESSION: No evidence of acute intracranial abnormality.   Moderate chronic small vessel ischemic changes within the cerebral white matter.   Mild generalized parenchymal atrophy.   Paranasal sinus disease, as described.  Assessment & Plan:   1. Gastroesophageal reflux disease, unspecified whether esophagitis present Mr. Nieblas is managed well on  Protonix 40 mg daily. He had some mild anemia in the past. I will check  CBC to screen for anemia, possibly secondary to GI blood loss.  - CBC  2. MCI (mild cognitive impairment) Stable. Patient lives independently. Continue donepzil 10 mg daily.  3. Dysthymia PHQ score ius low. Appears to be well managed on Remeron 45 mg daily.  4. Prostate cancer ([place]) Continue to follow with urology.  5. Hyperglycemia Mr. Comella had an elevated glucose during an ER visit this summer. I will reassess his blood sugar today.  - Basic metabolic panel  6. Need for influenza vaccination  - Flu Vaccine QUAD High Dose(Fluad)  Return in about 6 months (around 01/23/2023) for Reassessment.   Haydee Salter, MD

## 2022-08-12 ENCOUNTER — Other Ambulatory Visit: Payer: Self-pay | Admitting: Family Medicine

## 2022-08-12 DIAGNOSIS — F341 Dysthymic disorder: Secondary | ICD-10-CM

## 2022-08-12 DIAGNOSIS — G4700 Insomnia, unspecified: Secondary | ICD-10-CM

## 2022-08-14 ENCOUNTER — Telehealth: Payer: Self-pay

## 2022-08-14 NOTE — Progress Notes (Signed)
    Chronic Care Management Pharmacy Assistant   Name: [name]  MRN: [id number] DOB: 04/25/1935  Reason for Encounter: Medication Review/Medication Coordination Call.   Recent office visits:  07/25/2022 Dr. Gena Fray MD (PCP) No Medication Changes noted, return in about 6 months  Recent consult visits:  None ID  Hospital visits:  None in previous 6 months  Medications: Outpatient Encounter Medications as of 08/14/2022  Medication Sig   acetaminophen (TYLENOL) 325 MG tablet Take 2 tablets (650 mg total) by mouth every 6 (six) hours as needed for moderate pain.   aspirin 81 MG tablet Take 81 mg by mouth daily.   Cholecalciferol (VITAMIN D3) 50 MCG (2000 UT) TABS Take 2,000 Units by mouth daily.   cycloSPORINE (RESTASIS) 0.05 % ophthalmic emulsion 1 drop 2 (two) times daily.    donepezil (ARICEPT) 10 MG tablet Take 1 tablet (10 mg total) by mouth at bedtime. Must keep follow up 02/21/22 for ongoing refills   finasteride (PROSCAR) 5 MG tablet Take 5 mg by mouth daily.   latanoprost (XALATAN) 0.005 % ophthalmic solution 1 drop at bedtime.   mirtazapine (REMERON) 45 MG tablet TAKE ONE TABLET BY MOUTH EVERYDAY AT BEDTIME   Multiple Vitamin (MULTIVITAMIN) capsule Take 1 capsule by mouth daily.   pantoprazole (PROTONIX) 40 MG tablet TAKE ONE TABLET BY MOUTH EVERY MORNING Needs appointment for further refills   valACYclovir (VALTREX) 500 MG tablet TAKE ONE TABLET BY MOUTH twice daily FOR 3 DAYS AS NEEDED FOR outbreaks   No facility-administered encounter medications on file as of 08/14/2022.    Care Gaps: Shinfrix Vaccine COVID-19 Vaccine (3- Pfizer risk series)    Star Rating Drugs: None ID   Medication Fill Gaps: None ID  Reviewed chart for medication changes ahead of medication coordination call.  BP Readings from Last 3 Encounters:  07/25/22 116/64  06/24/22 130/67  05/23/22 (!) 141/79    No results found for: "HGBA1C"   Patient obtains medications through  Adherence Packaging  30 Days   Last adherence delivery included:  Aspirin 81 mg Daily - bedtime Vitamin D3 50 MCG Daily- Breakfast Donepezil 10 mg tablet Daily -Bedtime Finasteride 5 mg tablet Daily-Bedtime Mirtazapine 45 mg tablet Daily-Bedtime Pantoprazole 40 mg tablet Daily- Breakfast Multivitamin Capsule Daily-Breakfast  Patient declined medications last month: Latanoprost 0.005% Eye Drops -one drop in each eye at bedtime - Adequate supply Valacyclovir 500 mg -PRN  Restasis 0.05% two times Daily-Adequate supply  Patient is due for next adherence delivery on: 08/26/2022. Called patient and reviewed medications and coordinated delivery.  This delivery to include: Aspirin 81 mg Daily - bedtime Vitamin D3 50 MCG Daily- Breakfast Donepezil 10 mg tablet Daily -Bedtime Finasteride 5 mg tablet Daily-Bedtime Mirtazapine 45 mg tablet Daily-Bedtime Pantoprazole 40 mg tablet Daily- Breakfast Multivitamin Capsule Daily-Breakfast Latanoprost 0.005% Eye Drops -one drop in each eye at bedtime Restasis 0.05% one drop two times Daily   Patient declined the following medications: Valacyclovir 500 mg -PRN   Patient needs refills for None ID.  Confirmed delivery date of 08/26/2022 (Second Route), advised patient that pharmacy will contact them the morning of delivery.  [place] Pharmacist Assistant [phone]

## 2022-09-10 ENCOUNTER — Other Ambulatory Visit: Payer: Self-pay | Admitting: Neurology

## 2022-09-10 DIAGNOSIS — G3184 Mild cognitive impairment, so stated: Secondary | ICD-10-CM

## 2022-09-11 ENCOUNTER — Telehealth: Payer: Self-pay

## 2022-09-11 NOTE — Progress Notes (Signed)
    Chronic Care Management Pharmacy Assistant   Name: [name]  MRN: [id number] DOB: [birth year]  Reason for Encounter: Medication Review/Medication Coordination Call.   Recent office visits:  None ID  Recent consult visits:  None ID  Hospital visits:  None in previous 6 months  Medications: Outpatient Encounter Medications as of 09/11/2022  Medication Sig   acetaminophen (TYLENOL) 325 MG tablet Take 2 tablets (650 mg total) by mouth every 6 (six) hours as needed for moderate pain.   aspirin 81 MG tablet Take 81 mg by mouth daily.   Cholecalciferol (VITAMIN D3) 50 MCG (2000 UT) TABS Take 2,000 Units by mouth daily.   cycloSPORINE (RESTASIS) 0.05 % ophthalmic emulsion 1 drop 2 (two) times daily.    donepezil (ARICEPT) 10 MG tablet TAKE ONE TABLET BY MOUTH EVERYDAY AT BEDTIME   finasteride (PROSCAR) 5 MG tablet Take 5 mg by mouth daily.   latanoprost (XALATAN) 0.005 % ophthalmic solution 1 drop at bedtime.   mirtazapine (REMERON) 45 MG tablet TAKE ONE TABLET BY MOUTH EVERYDAY AT BEDTIME   Multiple Vitamin (MULTIVITAMIN) capsule Take 1 capsule by mouth daily.   pantoprazole (PROTONIX) 40 MG tablet TAKE ONE TABLET BY MOUTH EVERY MORNING Needs appointment for further refills   valACYclovir (VALTREX) 500 MG tablet TAKE ONE TABLET BY MOUTH twice daily FOR 3 DAYS AS NEEDED FOR outbreaks   No facility-administered encounter medications on file as of 09/11/2022.   Care Gaps: Shinfrix Vaccine COVID-19 Vaccine (3- Pfizer risk series)     Star Rating Drugs: None ID  Reviewed chart for medication changes ahead of medication coordination call.   BP Readings from Last 3 Encounters:  07/25/22 116/64  06/24/22 130/67  05/23/22 (!) 141/79    No results found for: "HGBA1C"   Patient obtains medications through Adherence Packaging  30 Days   Last adherence delivery included:  Aspirin 81 mg Daily - bedtime Vitamin D3 50 MCG Daily- Breakfast Donepezil 10 mg tablet Daily  -Bedtime Finasteride 5 mg tablet Daily-Bedtime Mirtazapine 45 mg tablet Daily-Bedtime Pantoprazole 40 mg tablet Daily- Breakfast Multivitamin Capsule Daily-Breakfast Latanoprost 0.005% Eye Drops -one drop in each eye at bedtime Restasis 0.05% one drop two times Daily  Patient declined medications last month: Valacyclovir 500 mg -PRN   Patient is due for next adherence delivery on: 09/24/2022. Called patient and reviewed medications and coordinated delivery.  This delivery to include: Aspirin 81 mg Daily - bedtime Vitamin D3 50 MCG Daily- Breakfast Donepezil 10 mg tablet Daily -Bedtime Finasteride 5 mg tablet Daily-Bedtime Mirtazapine 45 mg tablet Daily-Bedtime Pantoprazole 40 mg tablet Daily- Breakfast Multivitamin Capsule Daily-Breakfast Latanoprost 0.005% Eye Drops -one drop in each eye at bedtime   Patient declined the following medications: Restasis 0.05% one drop two times Daily- (Adequate Supply) Valacyclovir 500 mg -PRN   Patient needs refills for None ID.  Confirmed delivery date of 09/24/2022 (Second Route), advised patient that pharmacy will contact them the morning of delivery.  [place] Pharmacist Assistant [phone]

## 2022-09-12 DIAGNOSIS — H18423 Band keratopathy, bilateral: Secondary | ICD-10-CM | POA: Diagnosis not present

## 2022-09-12 DIAGNOSIS — H40013 Open angle with borderline findings, low risk, bilateral: Secondary | ICD-10-CM | POA: Diagnosis not present

## 2022-10-11 ENCOUNTER — Telehealth: Payer: Self-pay

## 2022-10-11 NOTE — Progress Notes (Signed)
    Chronic Care Management Pharmacy Assistant   Name: [name]  MRN: [id number] DOB: 1935-10-15  Reason for Encounter: Medication Review/Medication Coordination Call.   Recent office visits:  None ID   Recent consult visits:  None ID   Hospital visits:  None in previous 6 months  Medications: Outpatient Encounter Medications as of 10/11/2022  Medication Sig   acetaminophen (TYLENOL) 325 MG tablet Take 2 tablets (650 mg total) by mouth every 6 (six) hours as needed for moderate pain.   aspirin 81 MG tablet Take 81 mg by mouth daily.   Cholecalciferol (VITAMIN D3) 50 MCG (2000 UT) TABS Take 2,000 Units by mouth daily.   cycloSPORINE (RESTASIS) 0.05 % ophthalmic emulsion 1 drop 2 (two) times daily.    donepezil (ARICEPT) 10 MG tablet TAKE ONE TABLET BY MOUTH EVERYDAY AT BEDTIME   finasteride (PROSCAR) 5 MG tablet Take 5 mg by mouth daily.   latanoprost (XALATAN) 0.005 % ophthalmic solution 1 drop at bedtime.   mirtazapine (REMERON) 45 MG tablet TAKE ONE TABLET BY MOUTH EVERYDAY AT BEDTIME   Multiple Vitamin (MULTIVITAMIN) capsule Take 1 capsule by mouth daily.   pantoprazole (PROTONIX) 40 MG tablet TAKE ONE TABLET BY MOUTH EVERY MORNING Needs appointment for further refills   valACYclovir (VALTREX) 500 MG tablet TAKE ONE TABLET BY MOUTH twice daily FOR 3 DAYS AS NEEDED FOR outbreaks   No facility-administered encounter medications on file as of 10/11/2022.    Care Gaps: Shinfrix Vaccine COVID-19 Vaccine (3- Pfizer risk series)     Star Rating Drugs: None ID  Reviewed chart for medication changes ahead of medication coordination call.  BP Readings from Last 3 Encounters:  07/25/22 116/64  06/24/22 130/67  05/23/22 (!) 141/79    No results found for: "HGBA1C"   Patient obtains medications through Adherence Packaging  30 Days   Last adherence delivery included:  Aspirin 81 mg Daily - bedtime Vitamin D3 50 MCG Daily- Breakfast Donepezil 10 mg tablet Daily  -Bedtime Finasteride 5 mg tablet Daily-Bedtime Mirtazapine 45 mg tablet Daily-Bedtime Pantoprazole 40 mg tablet Daily- Breakfast Multivitamin Capsule Daily-Breakfast Latanoprost 0.005% Eye Drops -one drop in each eye at bedtime  Patient declined medications last month: Restasis 0.05% one drop two times Daily- (Adequate Supply) Valacyclovir 500 mg -PRN   Patient is due for next adherence delivery on: 10/23/2022. Called patient and reviewed medications and coordinated delivery.  This delivery to include: Aspirin 81 mg Daily - bedtime Vitamin D3 50 MCG Daily- Breakfast Donepezil 10 mg tablet Daily -Bedtime Finasteride 5 mg tablet Daily-Bedtime Mirtazapine 45 mg tablet Daily-Bedtime Pantoprazole 40 mg tablet Daily- Breakfast Multivitamin Capsule Daily-Breakfast Latanoprost 0.005% Eye Drops -one drop in each eye at bedtime Timolol Drop 0.5% 1 Drop(s) In Eye(s) Every Morning   Patient declined the following medications: Restasis 0.05% one drop two times Daily- (Adequate Supply) Valacyclovir 500 mg -PRN   Patient needs refills for None ID.  Confirmed delivery date of 10/23/2022 (First route), advised patient that pharmacy will contact them the morning of delivery.  [place] Pharmacist Assistant [phone]

## 2022-11-12 ENCOUNTER — Telehealth: Payer: Self-pay

## 2022-11-12 NOTE — Progress Notes (Signed)
Care Management & Coordination Services Pharmacy Team  Reason for Encounter: Medication coordination and delivery  Contacted patient on 11/12/2022 to discuss medications   Recent office visits:  None ID  Recent consult visits:  None ID  Hospital visits:  None in previous 6 months  Medications: Outpatient Encounter Medications as of 11/12/2022  Medication Sig   acetaminophen (TYLENOL) 325 MG tablet Take 2 tablets (650 mg total) by mouth every 6 (six) hours as needed for moderate pain.   aspirin 81 MG tablet Take 81 mg by mouth daily.   Cholecalciferol (VITAMIN D3) 50 MCG (2000 UT) TABS Take 2,000 Units by mouth daily.   cycloSPORINE (RESTASIS) 0.05 % ophthalmic emulsion 1 drop 2 (two) times daily.    donepezil (ARICEPT) 10 MG tablet TAKE ONE TABLET BY MOUTH EVERYDAY AT BEDTIME   finasteride (PROSCAR) 5 MG tablet Take 5 mg by mouth daily.   latanoprost (XALATAN) 0.005 % ophthalmic solution 1 drop at bedtime.   mirtazapine (REMERON) 45 MG tablet TAKE ONE TABLET BY MOUTH EVERYDAY AT BEDTIME   Multiple Vitamin (MULTIVITAMIN) capsule Take 1 capsule by mouth daily.   pantoprazole (PROTONIX) 40 MG tablet TAKE ONE TABLET BY MOUTH EVERY MORNING Needs appointment for further refills   valACYclovir (VALTREX) 500 MG tablet TAKE ONE TABLET BY MOUTH twice daily FOR 3 DAYS AS NEEDED FOR outbreaks   No facility-administered encounter medications on file as of 11/12/2022.   BP Readings from Last 3 Encounters:  07/25/22 116/64  06/24/22 130/67  05/23/22 (!) 141/79    Pulse Readings from Last 3 Encounters:  07/25/22 92  06/24/22 94  05/23/22 86    No results found for: "HGBA1C" Lab Results  Component Value Date   CREATININE 0.89 07/25/2022   BUN 14 07/25/2022   GFR 78.35 07/25/2022   GFRNONAA >60 06/24/2022   GFRAA 92 07/28/2017   NA 142 07/25/2022   K 4.7 07/25/2022   CALCIUM 10.6 (H) 07/25/2022   CO2 30 07/25/2022   Care Gaps: Shinfrix Vaccine COVID-19 Vaccine (3- Pfizer risk  series)     Star Rating Drugs: None ID  Last adherence delivery date:10/23/2022      Patient is due for next adherence delivery on: 11/22/2022  Spoke with patient on 11/12/2022  reviewed medications and coordinated delivery.  This delivery to include: Adherence Packaging  30 Days  Aspirin 81 mg Daily - bedtime Vitamin D3 50 MCG Daily- Breakfast Donepezil 10 mg tablet Daily -Bedtime Finasteride 5 mg tablet Daily-Bedtime Mirtazapine 45 mg tablet Daily-Bedtime Pantoprazole 40 mg tablet Daily- Breakfast Multivitamin Capsule Daily-Breakfast Latanoprost 0.005% Eye Drops -one drop in each eye at bedtime Timolol drops 0.50%- one drop in each eye every morning  Patient declined the following medications this month: Restasis 0.05% one drop two times Daily- (Adequate Supply) Valacyclovir 500 mg -PRN   No refill request needed.  Confirmed delivery date of 11/22/2022 (Second route), advised patient that pharmacy will contact them the morning of delivery.   Any concerns about your medications? No  How often do you forget or accidentally miss a dose? Never  Do you use a pillbox? No  Is patient in packaging Yes   Cycle dispensing form sent to [name] for review.  Patient states he has change his health plan to [facility] health, and will inform the pharmacy when they called him to confirm the delivery.  [place] Pharmacist Assistant [phone]

## 2022-12-09 DIAGNOSIS — H40013 Open angle with borderline findings, low risk, bilateral: Secondary | ICD-10-CM | POA: Diagnosis not present

## 2023-01-16 ENCOUNTER — Other Ambulatory Visit: Payer: Self-pay | Admitting: Gastroenterology

## 2023-01-16 DIAGNOSIS — K219 Gastro-esophageal reflux disease without esophagitis: Secondary | ICD-10-CM

## 2023-01-23 ENCOUNTER — Encounter: Payer: Self-pay | Admitting: Family Medicine

## 2023-01-23 ENCOUNTER — Ambulatory Visit: Payer: No Typology Code available for payment source | Admitting: Family Medicine

## 2023-01-23 VITALS — BP 124/60 | HR 85 | Temp 96.9°F | Ht 66.0 in | Wt 138.2 lb

## 2023-01-23 DIAGNOSIS — K219 Gastro-esophageal reflux disease without esophagitis: Secondary | ICD-10-CM | POA: Diagnosis not present

## 2023-01-23 DIAGNOSIS — G4700 Insomnia, unspecified: Secondary | ICD-10-CM | POA: Diagnosis not present

## 2023-01-23 DIAGNOSIS — C61 Malignant neoplasm of prostate: Secondary | ICD-10-CM | POA: Diagnosis not present

## 2023-01-23 MED ORDER — PANTOPRAZOLE SODIUM 40 MG PO TBEC: DELAYED_RELEASE_TABLET | ORAL | 0 refills | Status: DC

## 2023-01-23 NOTE — Assessment & Plan Note (Signed)
Continue monitoring with urology. Patient prefers to follow-up with them regarding annual PSA testing.

## 2023-01-23 NOTE — Assessment & Plan Note (Signed)
Stable. Continue mirtazapine 45 mg qhs.

## 2023-01-23 NOTE — Assessment & Plan Note (Signed)
Stable.  Continue Protonix 40 mg daily

## 2023-01-23 NOTE — Progress Notes (Signed)
[place] [facility] [name] [name] [id number] Dept: [phone] Dept Fax: [phone]  Chronic Care Office Visit  Subjective:    Patient ID: [name], male    DOB: [birth year], 88 y.o..   MRN: [id number]  Chief Complaint  Patient presents with   Medical Management of Chronic Issues    6 month f/u.  No concerns.  Not fasting today.    History of Present Illness:  Patient is in today for reassessment of chronic medical issues.  [name] has a history of GERD. He is managed on Protonix with good control.   [name] has a history of mild cognitive impairment. He is managed on Aricept.   [name] has a history of chronic insomnia. He is managed on mirtazapine and apparently this is working well for him.   [name] has a history of prostate cancer. He has not had surgery, chemo-, or radiation. He notes Dr. Diona Fanti has been monitoring this and performed periodic biopsies for this. He has been referred to a new urologist with Dr. Alan Ripper retirement.  Past Medical History: Patient Active Problem List   Diagnosis Date Noted   TIA (transient ischemic attack) 07/25/2022   Mild vitamin D deficiency 07/25/2022   MCI (mild cognitive impairment) 05/23/2022   Prostate cancer ([place]) 08/31/2020   Vitiligo 08/08/2020   History of gout 08/08/2020   Elevated LDL cholesterol level 07/29/2019   Gout 07/29/2018   Obstructive sleep apnea treated with bilevel positive airway pressure (BiPAP) 05/01/2017   Sensorineural hearing loss (SNHL), bilateral 12/22/2015   Hypersomnia, persistent 11/24/2013   Insomnia, persistent 07/22/2013   Abnormal chest percussion 07/22/2013   Subjective vision disturbance 04/19/2013   Loss of weight 11/13/2011   Anal or rectal pain 11/13/2011   Personal history of colonic polyps 11/13/2011   HSV 02/16/2010   Dry eye syndrome 02/16/2010   Gastroesophageal reflux disease 02/16/2010    Past Surgical History:  Procedure Laterality Date   EYE SURGERY     both cataracts   INTRACAPSULAR CATARACT EXTRACTION     Lt eye   MASS EXCISION N/A 07/29/2013   Procedure: MINOR EXCISION OF CHEST KELOID CONTRACTURE;  Surgeon: Theodoro Kos, DO;  Location: [place];  Service: Plastics;  Laterality: N/A;  ACELL placement   MINOR GRAFT APPLICATION N/A [id number]   Procedure: MINOR GRAFT APPLICATION A CELL;  Surgeon: Theodoro Kos, DO;  Location: [place];  Service: Plastics;  Laterality: N/A;   REFRACTIVE SURGERY     Family History  Problem Relation Age of Onset   Cirrhosis Mother    Jaundice Mother    Cancer Father        Throat   Hyperlipidemia Father    Heart disease Father    Colon cancer Neg Hx    Esophageal cancer Neg Hx    Rectal cancer Neg Hx    Stomach cancer Neg Hx    Outpatient Medications Prior to Visit  Medication Sig Dispense Refill   acetaminophen (TYLENOL) 325 MG tablet Take 2 tablets (650 mg total) by mouth every 6 (six) hours as needed for moderate pain. 30 tablet 0   aspirin 81 MG tablet Take 81 mg by mouth daily.     Cholecalciferol (VITAMIN D3) 50 MCG (2000 UT) TABS Take 2,000 Units by mouth daily.     cycloSPORINE (RESTASIS) 0.05 % ophthalmic emulsion 1 drop 2 (two) times daily.      donepezil (ARICEPT) 10  MG tablet TAKE ONE TABLET BY MOUTH EVERYDAY AT BEDTIME 30 tablet 5   finasteride (PROSCAR) 5 MG tablet Take 5 mg by mouth daily.     latanoprost (XALATAN) 0.005 % ophthalmic solution 1 drop at bedtime.     mirtazapine (REMERON) 45 MG tablet TAKE ONE TABLET BY MOUTH EVERYDAY AT BEDTIME 90 tablet 3   Multiple Vitamin (MULTIVITAMIN) capsule Take 1 capsule by mouth daily.     valACYclovir (VALTREX) 500 MG tablet TAKE ONE TABLET BY MOUTH twice daily FOR 3 DAYS AS NEEDED FOR outbreaks 6 tablet 4   pantoprazole (PROTONIX) 40 MG tablet TAKE ONE TABLET BY MOUTH EVERY MORNING 30 tablet 0   No facility-administered medications  prior to visit.   Allergies  Allergen Reactions   Sulfamethoxazole Other (See Comments)    Constipation   Sulfamethoxazole-Trimethoprim     constipation   Objective:   Today's Vitals   01/23/23 0845  BP: 124/60  Pulse: 85  Temp: (!) 96.9 F (36.1 C)  TempSrc: Temporal  SpO2: 98%  Weight: 138 lb 3.2 oz (62.7 kg)  Height: 5\' 6"  (1.676 m)   Body mass index is 22.31 kg/m.   General: Well developed, well nourished. No acute distress. Psych: Alert and oriented. Normal mood and affect.  Health Maintenance Due  Topic Date Due   Medicare Annual Wellness (AWV)  02/13/2023     Assessment & Plan:   Problem List Items Addressed This Visit       Digestive   Gastroesophageal reflux disease   Relevant Medications   pantoprazole (PROTONIX) 40 MG tablet     Other   Insomnia, persistent - Primary    Return in about 1 year (around 01/23/2024) for Annual preventative care.   Haydee Salter, MD

## 2023-02-21 ENCOUNTER — Ambulatory Visit (INDEPENDENT_AMBULATORY_CARE_PROVIDER_SITE_OTHER): Payer: No Typology Code available for payment source

## 2023-02-21 VITALS — Ht 66.0 in | Wt 135.0 lb

## 2023-02-21 DIAGNOSIS — Z0000 Encounter for general adult medical examination without abnormal findings: Secondary | ICD-10-CM

## 2023-02-21 NOTE — Progress Notes (Signed)
I connected with  [name] on 02/21/23 by a audio enabled telemedicine application and verified that I am speaking with the correct person using two identifiers.  Patient Location: Home  Provider Location: Office/Clinic  I discussed the limitations of evaluation and management by telemedicine. The patient expressed understanding and agreed to proceed.  Subjective:   [name] is a 88 y.o. male who presents for Medicare Annual/Subsequent preventive examination.  Review of Systems     Cardiac Risk Factors include: advanced age (>48men, >67 women);male gender     Objective:    Today's Vitals   02/21/23 1056  Weight: 135 lb (61.2 kg)  Height: 5\' 6"  (1.676 m)   Body mass index is 21.79 kg/m.     02/21/2023   10:59 AM 06/24/2022    8:15 AM 02/12/2022    9:02 AM 02/06/2021    1:35 PM 02/02/2020   11:55 AM 07/28/2017    1:37 PM 11/20/2015    8:48 AM  Advanced Directives  Does Patient Have a Medical Advance Directive? No No No No No No No  Would patient like information on creating a medical advance directive?  No - Guardian declined No - Patient declined No - Patient declined No - Patient declined Yes (ED - Information included in AVS)     Current Medications (verified) Outpatient Encounter Medications as of 02/21/2023  Medication Sig   acetaminophen (TYLENOL) 325 MG tablet Take 2 tablets (650 mg total) by mouth every 6 (six) hours as needed for moderate pain.   aspirin 81 MG tablet Take 81 mg by mouth daily.   Cholecalciferol (VITAMIN D3) 50 MCG (2000 UT) TABS Take 2,000 Units by mouth daily.   cycloSPORINE (RESTASIS) 0.05 % ophthalmic emulsion 1 drop 2 (two) times daily.    donepezil (ARICEPT) 10 MG tablet TAKE ONE TABLET BY MOUTH EVERYDAY AT BEDTIME   finasteride (PROSCAR) 5 MG tablet Take 5 mg by mouth daily.   latanoprost (XALATAN) 0.005 % ophthalmic solution 1 drop at bedtime.   mirtazapine (REMERON) 45 MG tablet TAKE ONE TABLET BY MOUTH EVERYDAY AT  BEDTIME   Multiple Vitamin (MULTIVITAMIN) capsule Take 1 capsule by mouth daily.   pantoprazole (PROTONIX) 40 MG tablet TAKE ONE TABLET BY MOUTH EVERY MORNING   valACYclovir (VALTREX) 500 MG tablet TAKE ONE TABLET BY MOUTH twice daily FOR 3 DAYS AS NEEDED FOR outbreaks   No facility-administered encounter medications on file as of 02/21/2023.    Allergies (verified) Sulfamethoxazole and Sulfamethoxazole-trimethoprim   History: Past Medical History:  Diagnosis Date   Barrett's esophagus    Cataract    Diverticulosis of colon (without mention of hemorrhage)    Elevated PSA    Esophageal stricture    GERD (gastroesophageal reflux disease)    Glaucoma    Gout    Herpes    Hiatal hernia    History of colon cancer    HOH (hard of hearing)    Hyperlipidemia    Hypersomnia, persistent 11/24/2013   Reports Epworth of 10 and higher on CPAP with  good compliance.    Hypertrophy of prostate without urinary obstruction and other lower urinary tract symptoms (LUTS)    Impotence of organic origin    Insomnia    Memory change 04/19/2013   OSA on CPAP    Prostate cancer    Prostatitis    Stroke    tia in  2010   TIA (transient ischemic attack)    Vertigo  Past Surgical History:  Procedure Laterality Date   EYE SURGERY     both cataracts   INTRACAPSULAR CATARACT EXTRACTION     Lt eye   MASS EXCISION N/A 07/29/2013   Procedure: MINOR EXCISION OF CHEST KELOID CONTRACTURE;  Surgeon: Wayland Denis, DO;  Location: [facility];  Service: Plastics;  Laterality: N/A;  ACELL placement   MINOR GRAFT APPLICATION N/A 07/29/2013   Procedure: MINOR GRAFT APPLICATION A CELL;  Surgeon: Wayland Denis, DO;  Location: [facility];  Service: Plastics;  Laterality: N/A;   REFRACTIVE SURGERY     Family History  Problem Relation Age of Onset   Cirrhosis Mother    Jaundice Mother    Cancer Father        Throat   Hyperlipidemia Father    Heart disease Father    Colon cancer  Neg Hx    Esophageal cancer Neg Hx    Rectal cancer Neg Hx    Stomach cancer Neg Hx    Social History   Socioeconomic History   Marital status: Divorced    Spouse name: Not on file   Number of children: 2   Years of education: 15   Highest education level: Not on file  Occupational History   Occupation: Real Estate  Tobacco Use   Smoking status: Former    Types: Cigarettes    Quit date: 07/23/1962    Years since quitting: 60.6   Smokeless tobacco: Never   Tobacco comments:    Stopped in 1963  Vaping Use   Vaping Use: Never used  Substance and Sexual Activity   Alcohol use: Yes    Comment: occasionally   Drug use: No   Sexual activity: Yes    Comment: 5 partners  Other Topics Concern   Not on file  Social History Narrative   Patient is single.   Patient has two children.   Patient is retired.   Patient has a college education.   Patient is right-handed.   Patient drinks about 3 cups of coffee daily.   Patient gets regular exercise.   Social Determinants of Health   Financial Resource Strain: Low Risk  (02/21/2023)   Overall Financial Resource Strain (CARDIA)    Difficulty of Paying Living Expenses: Not hard at all  Food Insecurity: No Food Insecurity (02/21/2023)   Hunger Vital Sign    Worried About Running Out of Food in the Last Year: Never true    Ran Out of Food in the Last Year: Never true  Transportation Needs: No Transportation Needs (02/21/2023)   PRAPARE - Administrator, Civil Service (Medical): No    Lack of Transportation (Non-Medical): No  Physical Activity: Sufficiently Active (02/21/2023)   Exercise Vital Sign    Days of Exercise per Week: 3 days    Minutes of Exercise per Session: 60 min  Stress: No Stress Concern Present (02/21/2023)   [employer] of Occupational Health - Occupational Stress Questionnaire    Feeling of Stress : Not at all  Social Connections: Socially Isolated (02/12/2022)   Social Connection and Isolation Panel  [NHANES]    Frequency of Communication with Friends and Family: Twice a week    Frequency of Social Gatherings with Friends and Family: Twice a week    Attends Religious Services: Never    Database administrator or Organizations: No    Attends Banker Meetings: Never    Marital Status: Divorced    Tobacco Counseling  Counseling given: Not Answered Tobacco comments: Stopped in 1963   Clinical Intake:  Pre-visit preparation completed: Yes  Pain : No/denies pain     Nutritional Status: BMI of 19-24  Normal Nutritional Risks: None Diabetes: No  How often do you need to have someone help you when you read instructions, pamphlets, or other written materials from your doctor or pharmacy?: 1 - Never  Diabetic? no  Interpreter Needed?: No  Information entered by :: NAllen LPN   Activities of Daily Living    02/21/2023   11:00 AM  In your present state of health, do you have any difficulty performing the following activities:  Hearing? 0  Vision? 0  Difficulty concentrating or making decisions? 0  Walking or climbing stairs? 0  Dressing or bathing? 0  Doing errands, shopping? 0  Preparing Food and eating ? N  Using the Toilet? N  In the past six months, have you accidently leaked urine? N  Do you have problems with loss of bowel control? N  Managing your Medications? N  Managing your Finances? N  Housekeeping or managing your Housekeeping? N    Patient Care Team: Loyola Mast, MD as PCP - General (Family Medicine) Suzanna Obey, MD as Consulting Physician (Otolaryngology) Dohmeier, Porfirio Mylar, MD as Consulting Physician (Neurology) Dillingham, Alena Bills, DO as Attending Physician (Plastic Surgery) Sallye Lat, MD as Consulting Physician (Ophthalmology) Marcine Matar, MD as Consulting Physician (Urology) York Spaniel, MD (Inactive) as Consulting Physician (Neurology) Gaspar Cola, [facility] as Pharmacist (Pharmacist)  Indicate any recent  Medical Services you may have received from other than [facility] providers in the past year (date may be approximate).     Assessment:   This is a routine wellness examination for [name].  Hearing/Vision screen Vision Screening - Comments:: Regular eye exams, [facility]  Dietary issues and exercise activities discussed: Current Exercise Habits: Home exercise routine, Type of exercise: walking, Time (Minutes): 60, Frequency (Times/Week): 3, Weekly Exercise (Minutes/Week): 180   Goals Addressed             This Visit's Progress    Patient Stated       02/21/2023, no goals       Depression Screen    02/21/2023   11:00 AM 01/23/2023    8:48 AM 07/25/2022    8:44 AM 02/12/2022    9:02 AM 02/12/2022    9:01 AM 02/06/2021    1:36 PM 02/07/2020    3:48 PM  PHQ 2/9 Scores  PHQ - 2 Score 0 0 1 0 0 0 0  PHQ- 9 Score  1 3    3     Fall Risk    02/21/2023   11:00 AM 01/23/2023    8:49 AM 02/12/2022    9:02 AM 02/06/2021    1:36 PM 08/31/2020    3:35 PM  Fall Risk   Falls in the past year? 0 0 0 0 0  Number falls in past yr: 0 0 0 0   Injury with Fall? 0 0 0 0   Risk for fall due to : Medication side effect No Fall Risks     Follow up Falls prevention discussed;Education provided;Falls evaluation completed Falls evaluation completed Falls evaluation completed Falls prevention discussed     FALL RISK PREVENTION PERTAINING TO THE HOME:  Any stairs in or around the home? Yes  If so, are there any without handrails? No  Home free of loose throw rugs in walkways, pet beds, electrical cords,  etc? Yes  Adequate lighting in your home to reduce risk of falls? Yes   ASSISTIVE DEVICES UTILIZED TO PREVENT FALLS:  Life alert? No  Use of a cane, walker or w/c? No  Grab bars in the bathroom? Yes  Shower chair or bench in shower? No  Elevated toilet seat or a handicapped toilet? No   TIMED UP AND GO:  Was the test performed? No .      Cognitive Function:    11/25/2016    8:44 AM  05/21/2016    8:56 AM 11/20/2015    8:51 AM 05/16/2015    9:00 AM  MMSE - Mini Mental State Exam  Orientation to time Orientation to Place Registration Attention/ Calculation Recall Language- name 2 objects Language- repeat Language- follow 3 step command Language- read & follow direction Write a sentence 1 1 0 1  Copy design Total score 02/21/2022    1:25 PM 08/31/2020    3:36 PM  Montreal Cognitive Assessment   Visuospatial/ Executive (0/5) 3 4  Naming (0/3) 3 3  Attention: Read list of digits (0/2) 2 2  Attention: Read list of letters (0/1) 1 1  Attention: Serial 7 subtraction starting at 100 (0/3) 3 3  Language: Repeat phrase (0/2) 2 2  Language : Fluency (0/1) 0 1  Abstraction (0/2) 2 2  Delayed Recall (0/5) 2 2  Orientation (0/6) 6 6  Total 24 26  Adjusted Score (based on education) 24       02/21/2023   11:01 AM  6CIT Screen  What Year? 0 points  What month? 0 points  What time? 0 points  Count back from 20 0 points  Months in reverse 0 points  Repeat phrase 4 points  Total Score 4 points    Immunizations Immunization History  Administered Date(s) Administered   Fluad Quad(high Dose 65+) 07/29/2019, 08/14/2020, 07/25/2022   Influenza, Seasonal, Injecte, Preservative Fre 10/14/2012   Influenza,inj,Quad PF,6+ Mos 09/15/2015, 07/24/2016, 07/28/2017   Influenza-Unspecified 10/04/2013, 07/29/2018   PFIZER(Purple Top)SARS-COV-2 Vaccination 11/29/2019, 12/20/2019   Pneumococcal Conjugate-13 04/18/2015   Pneumococcal Polysaccharide-23 04/06/2013   Td 02/03/2008   Tdap 05/07/2022   Zoster Recombinat (Shingrix) 06/10/2018    TDAP status: Up to date  Flu Vaccine status: Up to date  Pneumococcal vaccine status: Up to date  Covid-19 vaccine status: Completed vaccines  Qualifies for Shingles Vaccine? Yes   Zostavax completed No   Shingrix Completed?:  need second dose  Screening Tests Health Maintenance  Topic Date Due   COVID-19 Vaccine (3 - Pfizer risk series) 01/17/2020   Medicare Annual Wellness (AWV)  02/13/2023   Zoster Vaccines- Shingrix (2 of 2) 04/25/2023 (Originally 08/05/2018)   INFLUENZA VACCINE  06/05/2023   DTaP/Tdap/Td (3 - Td or Tdap) 05/07/2032   Pneumonia Vaccine 30+ Years old  Completed   HPV VACCINES  Aged Out   COLONOSCOPY (Pts 45-73yrs Insurance coverage will need to be confirmed)  Discontinued    Health Maintenance  Health Maintenance Due  Topic Date Due   COVID-19 Vaccine (3 - Pfizer risk series) 01/17/2020   Medicare Annual Wellness (AWV)  02/13/2023    Colorectal cancer screening:  No longer required.   Lung Cancer Screening: (Low Dose CT Chest recommended if Age 17-80 years, 30 pack-year currently smoking OR have quit w/in 15years.) does not qualify.   Lung Cancer Screening Referral: no  Additional Screening:  Hepatitis C Screening: does not qualify;  Vision Screening: Recommended annual ophthalmology exams for early detection of glaucoma and other disorders of the eye. Is the patient up to date with their annual eye exam?  Yes  Who is the provider or what is the name of the office in which the patient attends annual eye exams? [facility] If pt is not established with a provider, would they like to be referred to a provider to establish care? No .   Dental Screening: Recommended annual dental exams for proper oral hygiene  Community Resource Referral / Chronic Care Management: CRR required this visit?  No   CCM required this visit?  No      Plan:     I have personally reviewed and noted the following in the patient's chart:   Medical and social history Use of alcohol, tobacco or illicit drugs  Current medications and supplements including opioid prescriptions. Patient is not currently taking opioid prescriptions. Functional ability and status Nutritional status Physical  activity Advanced directives List of other physicians Hospitalizations, surgeries, and ER visits in previous 12 months Vitals Screenings to include cognitive, depression, and falls Referrals and appointments  In addition, I have reviewed and discussed with patient certain preventive protocols, quality metrics, and best practice recommendations. A written personalized care plan for preventive services as well as general preventive health recommendations were provided to patient.     Barb Merino, LPN   1/61/0960   Nurse Notes: none  Due to this being a virtual visit, the after visit summary with patients personalized plan was offered to patient via mail or my-chart.  to pick up at office at next visit

## 2023-02-21 NOTE — Patient Instructions (Signed)
[name] , Thank you for taking time to come for your Medicare Wellness Visit. I appreciate your ongoing commitment to your health goals. Please review the following plan we discussed and let me know if I can assist you in the future.   These are the goals we discussed:  Goals      Maintain healthy life and independence     Manage My Medicine     Timeframe:  Long-Range Goal Priority:  High Start Date: 06/26/21                             Expected End Date: 06/26/2022                       Follow Up Date 07/21/2021    - call if I am sick and can't take my medicine - keep a list of all the medicines I take; vitamins and herbals too - use a pillbox to sort medicine    Why is this important?   These steps will help you keep on track with your medicines.   Notes:      Patient Stated     02/21/2023, no goals        This is a list of the screening recommended for you and due dates:  Health Maintenance  Topic Date Due   COVID-19 Vaccine (3 - Pfizer risk series) 01/17/2020   Zoster (Shingles) Vaccine (2 of 2) 04/25/2023*   Flu Shot  06/05/2023   Medicare Annual Wellness Visit  02/21/2024   DTaP/Tdap/Td vaccine (3 - Td or Tdap) 05/07/2032   Pneumonia Vaccine  Completed   HPV Vaccine  Aged Out   Colon Cancer Screening  Discontinued  *Topic was postponed. The date shown is not the original due date.    Advanced directives: Advance directive discussed with you today.   Conditions/risks identified: none  Next appointment: Follow up in one year for your annual wellness visit.   Preventive Care 23 Years and Older, Male  Preventive care refers to lifestyle choices and visits with your health care provider that can promote health and wellness. What does preventive care include? A yearly physical exam. This is also called an annual well check. Dental exams once or twice a year. Routine eye exams. Ask your health care provider how often you should have your eyes checked. Personal  lifestyle choices, including: Daily care of your teeth and gums. Regular physical activity. Eating a healthy diet. Avoiding tobacco and drug use. Limiting alcohol use. Practicing safe sex. Taking low doses of aspirin every day. Taking vitamin and mineral supplements as recommended by your health care provider. What happens during an annual well check? The services and screenings done by your health care provider during your annual well check will depend on your age, overall health, lifestyle risk factors, and family history of disease. Counseling  Your health care provider may ask you questions about your: Alcohol use. Tobacco use. Drug use. Emotional well-being. Home and relationship well-being. Sexual activity. Eating habits. History of falls. Memory and ability to understand (cognition). Work and work Astronomer. Screening  You may have the following tests or measurements: Height, weight, and BMI. Blood pressure. Lipid and cholesterol levels. These may be checked every 5 years, or more frequently if you are over 75 years old. Skin check. Lung cancer screening. You may have this screening every year starting at age 33 if you have a 30-pack-year  history of smoking and currently smoke or have quit within the past 15 years. Fecal occult blood test (FOBT) of the stool. You may have this test every year starting at age 53. Flexible sigmoidoscopy or colonoscopy. You may have a sigmoidoscopy every 5 years or a colonoscopy every 10 years starting at age 27. Prostate cancer screening. Recommendations will vary depending on your family history and other risks. Hepatitis C blood test. Hepatitis B blood test. Sexually transmitted disease (STD) testing. Diabetes screening. This is done by checking your blood sugar (glucose) after you have not eaten for a while (fasting). You may have this done every 1-3 years. Abdominal aortic aneurysm (AAA) screening. You may need this if you are a  current or former smoker. Osteoporosis. You may be screened starting at age 66 if you are at high risk. Talk with your health care provider about your test results, treatment options, and if necessary, the need for more tests. Vaccines  Your health care provider may recommend certain vaccines, such as: Influenza vaccine. This is recommended every year. Tetanus, diphtheria, and acellular pertussis (Tdap, Td) vaccine. You may need a Td booster every 10 years. Zoster vaccine. You may need this after age 60. Pneumococcal 13-valent conjugate (PCV13) vaccine. One dose is recommended after age 77. Pneumococcal polysaccharide (PPSV23) vaccine. One dose is recommended after age 40. Talk to your health care provider about which screenings and vaccines you need and how often you need them. This information is not intended to replace advice given to you by your health care provider. Make sure you discuss any questions you have with your health care provider. Document Released: 11/17/2015 Document Revised: 07/10/2016 Document Reviewed: 08/22/2015 Elsevier Interactive Patient Education  2017 [employer].  Fall Prevention in the Home Falls can cause injuries. They can happen to people of all ages. There are many things you can do to make your home safe and to help prevent falls. What can I do on the outside of my home? Regularly fix the edges of walkways and driveways and fix any cracks. Remove anything that might make you trip as you walk through a door, such as a raised step or threshold. Trim any bushes or trees on the path to your home. Use bright outdoor lighting. Clear any walking paths of anything that might make someone trip, such as rocks or tools. Regularly check to see if handrails are loose or broken. Make sure that both sides of any steps have handrails. Any raised decks and porches should have guardrails on the edges. Have any leaves, snow, or ice cleared regularly. Use sand or salt on  walking paths during winter. Clean up any spills in your garage right away. This includes oil or grease spills. What can I do in the bathroom? Use night lights. Install grab bars by the toilet and in the tub and shower. Do not use towel bars as grab bars. Use non-skid mats or decals in the tub or shower. If you need to sit down in the shower, use a plastic, non-slip stool. Keep the floor dry. Clean up any water that spills on the floor as soon as it happens. Remove soap buildup in the tub or shower regularly. Attach bath mats securely with double-sided non-slip rug tape. Do not have throw rugs and other things on the floor that can make you trip. What can I do in the bedroom? Use night lights. Make sure that you have a light by your bed that is easy to reach. Do  not use any sheets or blankets that are too big for your bed. They should not hang down onto the floor. Have a firm chair that has side arms. You can use this for support while you get dressed. Do not have throw rugs and other things on the floor that can make you trip. What can I do in the kitchen? Clean up any spills right away. Avoid walking on wet floors. Keep items that you use a lot in easy-to-reach places. If you need to reach something above you, use a strong step stool that has a grab bar. Keep electrical cords out of the way. Do not use floor polish or wax that makes floors slippery. If you must use wax, use non-skid floor wax. Do not have throw rugs and other things on the floor that can make you trip. What can I do with my stairs? Do not leave any items on the stairs. Make sure that there are handrails on both sides of the stairs and use them. Fix handrails that are broken or loose. Make sure that handrails are as long as the stairways. Check any carpeting to make sure that it is firmly attached to the stairs. Fix any carpet that is loose or worn. Avoid having throw rugs at the top or bottom of the stairs. If you do  have throw rugs, attach them to the floor with carpet tape. Make sure that you have a light switch at the top of the stairs and the bottom of the stairs. If you do not have them, ask someone to add them for you. What else can I do to help prevent falls? Wear shoes that: Do not have high heels. Have rubber bottoms. Are comfortable and fit you well. Are closed at the toe. Do not wear sandals. If you use a stepladder: Make sure that it is fully opened. Do not climb a closed stepladder. Make sure that both sides of the stepladder are locked into place. Ask someone to hold it for you, if possible. Clearly mark and make sure that you can see: Any grab bars or handrails. First and last steps. Where the edge of each step is. Use tools that help you move around (mobility aids) if they are needed. These include: Canes. Walkers. Scooters. Crutches. Turn on the lights when you go into a dark area. Replace any light bulbs as soon as they burn out. Set up your furniture so you have a clear path. Avoid moving your furniture around. If any of your floors are uneven, fix them. If there are any pets around you, be aware of where they are. Review your medicines with your doctor. Some medicines can make you feel dizzy. This can increase your chance of falling. Ask your doctor what other things that you can do to help prevent falls. This information is not intended to replace advice given to you by your health care provider. Make sure you discuss any questions you have with your health care provider. Document Released: 08/17/2009 Document Revised: 03/28/2016 Document Reviewed: 11/25/2014 Elsevier Interactive Patient Education  2017 [employer].

## 2023-03-19 ENCOUNTER — Other Ambulatory Visit: Payer: Self-pay | Admitting: Family Medicine

## 2023-03-19 DIAGNOSIS — K219 Gastro-esophageal reflux disease without esophagitis: Secondary | ICD-10-CM

## 2023-03-19 DIAGNOSIS — G3184 Mild cognitive impairment, so stated: Secondary | ICD-10-CM

## 2023-06-09 DIAGNOSIS — H40013 Open angle with borderline findings, low risk, bilateral: Secondary | ICD-10-CM | POA: Diagnosis not present

## 2023-07-14 DIAGNOSIS — C61 Malignant neoplasm of prostate: Secondary | ICD-10-CM | POA: Diagnosis not present

## 2023-07-14 DIAGNOSIS — N401 Enlarged prostate with lower urinary tract symptoms: Secondary | ICD-10-CM | POA: Diagnosis not present

## 2023-07-14 DIAGNOSIS — R3915 Urgency of urination: Secondary | ICD-10-CM | POA: Diagnosis not present

## 2023-12-09 DIAGNOSIS — Z961 Presence of intraocular lens: Secondary | ICD-10-CM | POA: Diagnosis not present

## 2023-12-09 DIAGNOSIS — H40013 Open angle with borderline findings, low risk, bilateral: Secondary | ICD-10-CM | POA: Diagnosis not present

## 2024-01-09 DIAGNOSIS — K219 Gastro-esophageal reflux disease without esophagitis: Secondary | ICD-10-CM | POA: Diagnosis not present

## 2024-01-09 DIAGNOSIS — F0390 Unspecified dementia without behavioral disturbance: Secondary | ICD-10-CM | POA: Diagnosis not present

## 2024-01-09 DIAGNOSIS — Z008 Encounter for other general examination: Secondary | ICD-10-CM | POA: Diagnosis not present

## 2024-01-09 DIAGNOSIS — H409 Unspecified glaucoma: Secondary | ICD-10-CM | POA: Diagnosis not present

## 2024-02-23 ENCOUNTER — Ambulatory Visit (INDEPENDENT_AMBULATORY_CARE_PROVIDER_SITE_OTHER): Payer: No Typology Code available for payment source

## 2024-02-23 DIAGNOSIS — Z0000 Encounter for general adult medical examination without abnormal findings: Secondary | ICD-10-CM | POA: Diagnosis not present

## 2024-02-23 NOTE — Progress Notes (Signed)
 Subjective:   [name] is a 89 y.o. who presents for a Medicare Wellness preventive visit.  Visit Complete: Virtual I connected with  [name] on 02/23/24 by a audio enabled telemedicine application and verified that I am speaking with the correct person using two identifiers.  Patient Location: Home  Provider Location: Office/Clinic  I discussed the limitations of evaluation and management by telemedicine. The patient expressed understanding and agreed to proceed.  Vital Signs: Because this visit was a virtual/telehealth visit, some criteria may be missing or patient reported. Any vitals not documented were not able to be obtained and vitals that have been documented are patient reported.  VideoError- Librarian, academic were attempted between this provider and patient, however failed, due to patient having technical difficulties OR patient did not have access to video capability.  We continued and completed visit with audio only.   Persons Participating in Visit: Patient.  AWV Questionnaire: No: Patient Medicare AWV questionnaire was not completed prior to this visit.  Cardiac Risk Factors include: advanced age (>33men, >40 women);male gender     Objective:    Today's Vitals   There is no height or weight on file to calculate BMI.     02/23/2024   11:31 AM 02/21/2023   10:59 AM 06/24/2022    8:15 AM 02/12/2022    9:02 AM 02/06/2021    1:35 PM 02/02/2020   11:55 AM 07/28/2017    1:37 PM  Advanced Directives  Does Patient Have a Medical Advance Directive? No No No No No No No  Would patient like information on creating a medical advance directive?   No - Guardian declined No - Patient declined No - Patient declined No - Patient declined Yes (ED - Information included in AVS)    Current Medications (verified) Outpatient Encounter Medications as of 02/23/2024  Medication Sig   acetaminophen  (TYLENOL ) 325 MG tablet Take 2 tablets  (650 mg total) by mouth every 6 (six) hours as needed for moderate pain.   aspirin 81 MG tablet Take 81 mg by mouth daily.   Cholecalciferol (VITAMIN D3) 50 MCG (2000 UT) TABS Take 2,000 Units by mouth daily.   cycloSPORINE (RESTASIS) 0.05 % ophthalmic emulsion 1 drop 2 (two) times daily.    donepezil  (ARICEPT ) 10 MG tablet TAKE ONE TABLET BY MOUTH EVERYDAY AT BEDTIME   finasteride  (PROSCAR ) 5 MG tablet Take 5 mg by mouth daily.   latanoprost (XALATAN) 0.005 % ophthalmic solution 1 drop at bedtime.   mirtazapine  (REMERON ) 45 MG tablet TAKE ONE TABLET BY MOUTH EVERYDAY AT BEDTIME   Multiple Vitamin (MULTIVITAMIN) capsule Take 1 capsule by mouth daily.   valACYclovir  (VALTREX ) 500 MG tablet TAKE ONE TABLET BY MOUTH twice daily FOR 3 DAYS AS NEEDED FOR outbreaks   pantoprazole  (PROTONIX ) 40 MG tablet TAKE ONE TABLET BY MOUTH EVERY MORNING (Patient not taking: Reported on 02/23/2024)   No facility-administered encounter medications on file as of 02/23/2024.    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    Hypertrophy of prostate without urinary obstruction and other lower urinary tract symptoms (LUTS)    Impotence of organic origin    Insomnia    Memory change 04/19/2013   OSA on CPAP    Prostate cancer (HCC)    Prostatitis    Stroke (HCC)    tia in  2010   TIA (transient ischemic attack)    Vertigo    Past Surgical History:  Procedure Laterality Date   EYE SURGERY     both cataracts   INTRACAPSULAR CATARACT  EXTRACTION     Lt eye   MASS EXCISION N/A 07/29/2013   Procedure: MINOR EXCISION OF CHEST KELOID CONTRACTURE;  Surgeon: Marilou Showman, DO;  Location: [facility];  Service: Plastics;  Laterality: N/A;  ACELL placement   MINOR GRAFT APPLICATION N/A 07/29/2013   Procedure: MINOR GRAFT APPLICATION A CELL;  Surgeon: Marilou Showman, DO;  Location: [facility];  Service: Plastics;  Laterality: N/A;   REFRACTIVE SURGERY     Family History  Problem Relation Age of Onset   Cirrhosis Mother    Jaundice Mother    Cancer Father        Throat   Hyperlipidemia Father    Heart disease Father    Colon cancer Neg Hx    Esophageal cancer Neg Hx    Rectal cancer Neg Hx    Stomach cancer Neg Hx    Social History   Socioeconomic History   Marital status: Divorced    Spouse name: Not on file   Number of children: 2   Years of education: 15   Highest education level: Not on file  Occupational History   Occupation: Real Estate  Tobacco Use   Smoking status: Former    Current packs/day: 0.00    Types: Cigarettes    Quit date: 07/23/1962    Years since quitting: 61.6   Smokeless tobacco: Never   Tobacco comments:    Stopped in 1963  Vaping Use   Vaping status: Never Used  Substance and Sexual Activity   Alcohol use: Not Currently    Comment: occasionally   Drug use: No   Sexual activity: Yes    Comment: 5 partners  Other Topics Concern   Not on file  Social History Narrative   Patient is single.   Patient has two children.   Patient is retired.   Patient has a college education.   Patient is right-handed.   Patient drinks about 3 cups of coffee daily.   Patient gets regular exercise.   Social Drivers of Corporate investment banker Strain: Low Risk  (02/23/2024)   Overall Financial Resource Strain (CARDIA)    Difficulty of Paying Living Expenses: Not hard at all  Food Insecurity: No Food Insecurity (02/23/2024)   Hunger Vital Sign    Worried About Running  Out of Food in the Last Year: Never true    Ran Out of Food in the Last Year: Never true  Transportation Needs: No Transportation Needs (02/23/2024)   PRAPARE - Administrator, Civil Service (Medical): No    Lack of Transportation (Non-Medical): No  Physical Activity: Sufficiently Active (02/23/2024)   Exercise Vital Sign    Days of Exercise per Week: 3 days    Minutes of Exercise per Session: 60 min  Stress: No Stress Concern Present (02/23/2024)  [employer] of Occupational Health - Occupational Stress Questionnaire    Feeling of Stress : Not at all  Social Connections: Socially Isolated (02/23/2024)   Social Connection and Isolation Panel [NHANES]    Frequency of Communication with Friends and Family: Three times a week    Frequency of Social Gatherings with Friends and Family: Once a week    Attends Religious Services: Never    Database administrator or Organizations: No    Attends Engineer, structural: Never    Marital Status: Divorced    Tobacco Counseling Counseling given: Not Answered Tobacco comments: Stopped in 1963    Clinical Intake:  Pre-visit preparation completed: Yes  Pain : No/denies pain     Nutritional Risks: None Diabetes: No  No results found for: "HGBA1C"   How often do you need to have someone help you when you read instructions, pamphlets, or other written materials from your doctor or pharmacy?: 1 - Never  Interpreter Needed?: No  Information entered by :: NAllen LPN   Activities of Daily Living     02/23/2024   11:27 AM  In your present state of health, do you have any difficulty performing the following activities:  Hearing? 1  Comment states does not wear his hearing aid  Vision? 0  Difficulty concentrating or making decisions? 0  Walking or climbing stairs? 0  Dressing or bathing? 0  Doing errands, shopping? 0  Preparing Food and eating ? N  Using the Toilet? N  In the past six months, have you  accidently leaked urine? N  Do you have problems with loss of bowel control? N  Managing your Medications? N  Managing your Finances? N  Housekeeping or managing your Housekeeping? N    Patient Care Team: Graig Lawyer, MD as PCP - General (Family Medicine) Vernadine Golas, MD as Consulting Physician (Otolaryngology) Dohmeier, Raoul Byes, MD as Consulting Physician (Neurology) Dillingham, Lindaann Requena, DO as Attending Physician (Plastic Surgery) Devin Foerster, MD as Consulting Physician (Ophthalmology) Trent Frizzle, MD as Consulting Physician (Urology) Brian Campanile, MD (Inactive) as Consulting Physician (Neurology)  Indicate any recent Medical Services you may have received from other than [facility] providers in the past year (date may be approximate).     Assessment:   This is a routine wellness examination for [name].  Hearing/Vision screen Hearing Screening - Comments:: States trouble hearing, but does not wear his hearing aid Vision Screening - Comments:: Regular eye exams, [facility]   Goals Addressed             This Visit's Progress    Patient Stated       02/23/2024, wants to drink more water       Depression Screen     02/23/2024   11:32 AM 02/21/2023   11:00 AM 01/23/2023    8:48 AM 07/25/2022    8:44 AM 02/12/2022    9:02 AM 02/12/2022    9:01 AM 02/06/2021    1:36 PM  PHQ 2/9 Scores  PHQ - 2 Score 0 0 0 1 0 0 0  PHQ- 9 Score 0  1 3       Fall Risk     02/23/2024   11:32 AM 02/21/2023   11:00 AM 01/23/2023    8:49 AM 02/12/2022    9:02 AM 02/06/2021    1:36 PM  Fall Risk   Falls in the past year? 0 0 0 0 0  Number falls in past yr: 0  0 0 0 0  Injury with Fall? 0 0 0 0 0  Risk for fall due to : Medication side effect Medication side effect No Fall Risks    Follow up Falls prevention discussed;Falls evaluation completed Falls prevention discussed;Education provided;Falls evaluation completed Falls evaluation completed Falls evaluation completed Falls  prevention discussed    MEDICARE RISK AT HOME:  Medicare Risk at Home Any stairs in or around the home?: Yes If so, are there any without handrails?: No Home free of loose throw rugs in walkways, pet beds, electrical cords, etc?: Yes Adequate lighting in your home to reduce risk of falls?: Yes Life alert?: No Use of a cane, walker or w/c?: No Grab bars in the bathroom?: No Shower chair or bench in shower?: No Elevated toilet seat or a handicapped toilet?: No  TIMED UP AND GO:  Was the test performed?  No  Cognitive Function: 6CIT completed    11/25/2016    8:44 AM 05/21/2016    8:56 AM 11/20/2015    8:51 AM 05/16/2015    9:00 AM  MMSE - Mini Mental State Exam  Orientation to time 5 5 5 5   Orientation to Place 5 5 5 5   Registration 3 3 3 3   Attention/ Calculation 3 4 5 5   Recall 2 3 3 3   Language- name 2 objects 2 2 2 2   Language- repeat 1 1 1 1   Language- follow 3 step command 3 3 2 3   Language- read & follow direction 1 1 1 1   Write a sentence 1 1 0 1  Copy design 1 1 1 1   Total score 27 29 28 30       02/21/2022    1:25 PM 08/31/2020    3:36 PM  Montreal Cognitive Assessment   Visuospatial/ Executive (0/5) 3 4  Naming (0/3) 3 3  Attention: Read list of digits (0/2) 2 2  Attention: Read list of letters (0/1) 1 1  Attention: Serial 7 subtraction starting at 100 (0/3) 3 3  Language: Repeat phrase (0/2) 2 2  Language : Fluency (0/1) 0 1  Abstraction (0/2) 2 2  Delayed Recall (0/5) 2 2  Orientation (0/6) 6 6  Total 24 26  Adjusted Score (based on education) 24       02/23/2024   11:33 AM 02/21/2023   11:01 AM  6CIT Screen  What Year? 0 points 0 points  What month? 0 points 0 points  What time? 0 points 0 points  Count back from 20 0 points 0 points  Months in reverse 0 points 0 points  Repeat phrase 10 points 4 points  Total Score 10 points 4 points    Immunizations Immunization History  Administered Date(s) Administered   Fluad Quad(high Dose 65+)  07/29/2019, 08/14/2020, 07/25/2022   Influenza, Seasonal, Injecte, Preservative Fre 10/14/2012   Influenza,inj,Quad PF,6+ Mos 09/15/2015, 07/24/2016, 07/28/2017   Influenza-Unspecified 10/04/2013, 07/29/2018   PFIZER(Purple Top)SARS-COV-2 Vaccination 11/29/2019, 12/20/2019   Pneumococcal Conjugate-13 04/18/2015   Pneumococcal Polysaccharide-23 04/06/2013   Td 02/03/2008   Tdap 05/07/2022   Zoster Recombinant(Shingrix) 06/10/2018    Screening Tests Health Maintenance  Topic Date Due   Zoster Vaccines- Shingrix (2 of 2) 08/05/2018   COVID-19 Vaccine (3 - Pfizer risk series) 01/17/2020   INFLUENZA VACCINE  06/04/2024   Medicare Annual Wellness (AWV)  02/22/2025   DTaP/Tdap/Td (3 - Td or Tdap) 05/07/2032   Pneumonia Vaccine 73+ Years old  Completed   HPV VACCINES  Aged Out  Meningococcal B Vaccine  Aged Out   Colonoscopy  Discontinued    Health Maintenance  Health Maintenance Due  Topic Date Due   Zoster Vaccines- Shingrix (2 of 2) 08/05/2018   COVID-19 Vaccine (3 - Pfizer risk series) 01/17/2020   Health Maintenance Items Addressed: Declines vaccines  Additional Screening:  Vision Screening: Recommended annual ophthalmology exams for early detection of glaucoma and other disorders of the eye.  Dental Screening: Recommended annual dental exams for proper oral hygiene  Community Resource Referral / Chronic Care Management: CRR required this visit?  No   CCM required this visit?  No     Plan:     I have personally reviewed and noted the following in the patient's chart:   Medical and social history Use of alcohol, tobacco or illicit drugs  Current medications and supplements including opioid prescriptions. Patient is not currently taking opioid prescriptions. Functional ability and status Nutritional status Physical activity Advanced directives List of other physicians Hospitalizations, surgeries, and ER visits in previous 12 months Vitals Screenings to  include cognitive, depression, and falls Referrals and appointments  In addition, I have reviewed and discussed with patient certain preventive protocols, quality metrics, and best practice recommendations. A written personalized care plan for preventive services as well as general preventive health recommendations were provided to patient.     Areatha Beecham, LPN   [id number]   After Visit Summary: (MyChart) Due to this being a telephonic visit, the after visit summary with patients personalized plan was offered to patient via MyChart   Notes: Nothing significant to report at this time.

## 2024-02-23 NOTE — Patient Instructions (Signed)
 [name] , Thank you for taking time to come for your Medicare Wellness Visit. I appreciate your ongoing commitment to your health goals. Please review the following plan we discussed and let me know if I can assist you in the future.   Referrals/Orders/Follow-Ups/Clinician Recommendations: none  This is a list of the screening recommended for you and due dates:  Health Maintenance  Topic Date Due   Zoster (Shingles) Vaccine (2 of 2) 08/05/2018   COVID-19 Vaccine (3 - Pfizer risk series) 01/17/2020   Flu Shot  06/04/2024   Medicare Annual Wellness Visit  02/22/2025   DTaP/Tdap/Td vaccine (3 - Td or Tdap) 05/07/2032   Pneumonia Vaccine  Completed   HPV Vaccine  Aged Out   Meningitis B Vaccine  Aged Out   Colon Cancer Screening  Discontinued    Advanced directives: (ACP Link)Information on Advanced Care Planning can be found at [place]  Secretary of [facility] Advance Health Care Directives Advance Health Care Directives. [email or web address]   Next Medicare Annual Wellness Visit scheduled for next year: Yes  insert Preventive Care attachment Insert FALL PREVENTION attachment if needed

## 2024-02-24 ENCOUNTER — Encounter: Payer: Self-pay | Admitting: Family Medicine

## 2024-02-24 ENCOUNTER — Ambulatory Visit (INDEPENDENT_AMBULATORY_CARE_PROVIDER_SITE_OTHER): Admitting: Family Medicine

## 2024-02-24 ENCOUNTER — Other Ambulatory Visit: Payer: Self-pay

## 2024-02-24 ENCOUNTER — Other Ambulatory Visit (HOSPITAL_COMMUNITY): Payer: Self-pay

## 2024-02-24 VITALS — BP 132/80 | HR 71 | Temp 98.1°F | Ht 66.0 in | Wt 140.0 lb

## 2024-02-24 DIAGNOSIS — G3184 Mild cognitive impairment, so stated: Secondary | ICD-10-CM | POA: Diagnosis not present

## 2024-02-24 DIAGNOSIS — K219 Gastro-esophageal reflux disease without esophagitis: Secondary | ICD-10-CM

## 2024-02-24 DIAGNOSIS — Z0000 Encounter for general adult medical examination without abnormal findings: Secondary | ICD-10-CM | POA: Diagnosis not present

## 2024-02-24 DIAGNOSIS — Z8546 Personal history of malignant neoplasm of prostate: Secondary | ICD-10-CM | POA: Insufficient documentation

## 2024-02-24 DIAGNOSIS — C61 Malignant neoplasm of prostate: Secondary | ICD-10-CM

## 2024-02-24 DIAGNOSIS — H903 Sensorineural hearing loss, bilateral: Secondary | ICD-10-CM | POA: Diagnosis not present

## 2024-02-24 MED ORDER — FINASTERIDE 5 MG PO TABS
5.0000 mg | ORAL_TABLET | Freq: Every day | ORAL | 3 refills | Status: AC
  Filled 2024-02-24 – 2024-03-08 (×2): qty 90, 90d supply, fill #0

## 2024-02-24 MED ORDER — DONEPEZIL HCL 10 MG PO TABS
10.0000 mg | ORAL_TABLET | Freq: Every day | ORAL | 5 refills | Status: AC
  Filled 2024-02-24 – 2024-03-08 (×2): qty 30, 30d supply, fill #0

## 2024-02-24 NOTE — Assessment & Plan Note (Signed)
 Stable. I will renew his donepezil  10 mg daily.

## 2024-02-24 NOTE — Assessment & Plan Note (Signed)
 Overall health is good. Recommend ongoing regular exercise. Discussed recommended screenings and immunizations.

## 2024-02-24 NOTE — Assessment & Plan Note (Signed)
 Recommend he consider obtaining hearing aides, as hearing loss is associated with increased risk for progressive dementia.

## 2024-02-24 NOTE — Assessment & Plan Note (Signed)
Stable.  Continue Protonix 40 mg daily

## 2024-02-24 NOTE — Progress Notes (Signed)
 [facility] [address] [place] [address] Dept: [phone] Dept Fax: [phone]  Annual Physical Visit  Subjective:    Patient ID: [name], male    DOB: 11-17-1934, 89 y.o..   MRN: [id number]  Chief Complaint  Patient presents with   Annual Exam    CPE/labs.   No fasting today.  C/o having cough/congestion x 6 months.  No OTC meds taken.    History of Present Illness:  Patient is in today for an annual physical/preventative visit.  [name] has a history of GERD. He is managed on Protonix  40 mg daily with good control.   [name] has a history of mild cognitive impairment. He is managed on Aricept . He notes he had stopped taking this, but would like to resume.    [name] has a history of prostate cancer. He has not had surgery, chemo-, or radiation, but is monitored by urology. He is on finasteride  5 mg daily for prostatic obstructive symptoms.  Review of Systems  Constitutional:  Negative for chills, diaphoresis, fever, malaise/fatigue and weight loss.  HENT:  Positive for hearing loss. Negative for congestion, ear pain, sinus pain, sore throat and tinnitus.        Notes he needs hearing aides, but has never pursued these.  Eyes:  Negative for blurred vision, pain, discharge and redness.  Respiratory:  Positive for cough. Negative for shortness of breath and wheezing.        Has some occasional cough with mucous production. Had been a smoker in his early adulthood, but quit by 1964. Denies any allergy symptoms.  Cardiovascular:  Negative for chest pain and palpitations.  Gastrointestinal:  Negative for abdominal pain, constipation, diarrhea, heartburn, nausea and vomiting.  Musculoskeletal:  Negative for back pain, joint pain and myalgias.  Skin:  Positive for rash. Negative for itching.       Notes an area of rash on the left upper chest. This is not pruritic. He has an old scar across the  chest related to a keloid he developed.  Psychiatric/Behavioral:  Negative for depression. The patient is not nervous/anxious.    Past Medical History: Patient Active Problem List   Diagnosis Date Noted   History of prostate cancer 02/24/2024   TIA (transient ischemic attack) 07/25/2022   Mild vitamin D  deficiency 07/25/2022   MCI (mild cognitive impairment) 05/23/2022   Prostate cancer (HCC) 08/31/2020   Vitiligo 08/08/2020   History of gout 08/08/2020   Elevated LDL cholesterol level 07/29/2019   Gout 07/29/2018   Obstructive sleep apnea treated with bilevel positive airway pressure (BiPAP) 05/01/2017   Sensorineural hearing loss (SNHL), bilateral 12/22/2015   Hypersomnia, persistent 11/24/2013   Insomnia, persistent 07/22/2013   Abnormal chest percussion 07/22/2013   Subjective vision disturbance 04/19/2013   Loss of weight 11/13/2011   Anal or rectal pain 11/13/2011   History of colonic polyps 11/13/2011   Herpes simplex virus (HSV) infection 02/16/2010   Dry eye syndrome 02/16/2010   Gastroesophageal reflux disease 02/16/2010   Past Surgical History:  Procedure Laterality Date   EYE SURGERY     both cataracts   INTRACAPSULAR CATARACT EXTRACTION     Lt eye   MASS EXCISION N/A 07/29/2013   Procedure: MINOR EXCISION OF CHEST KELOID CONTRACTURE;  Surgeon: Marilou Showman, DO;  Location: [facility];  Service: Plastics;  Laterality: N/A;  ACELL placement   MINOR GRAFT APPLICATION N/A 07/29/2013   Procedure: MINOR GRAFT  APPLICATION A CELL;  Surgeon: Marilou Showman, DO;  Location: [facility];  Service: Plastics;  Laterality: N/A;   REFRACTIVE SURGERY     Family History  Problem Relation Age of Onset   Cirrhosis Mother    Jaundice Mother    Cancer Father        Throat   Hyperlipidemia Father    Heart disease Father    Colon cancer Neg Hx    Esophageal cancer Neg Hx    Rectal cancer Neg Hx    Stomach cancer Neg Hx    Outpatient Medications  Prior to Visit  Medication Sig Dispense Refill   acetaminophen  (TYLENOL ) 325 MG tablet Take 2 tablets (650 mg total) by mouth every 6 (six) hours as needed for moderate pain. 30 tablet 0   aspirin 81 MG tablet Take 81 mg by mouth daily.     Cholecalciferol (VITAMIN D3) 50 MCG (2000 UT) TABS Take 2,000 Units by mouth daily.     cycloSPORINE (RESTASIS) 0.05 % ophthalmic emulsion 1 drop 2 (two) times daily.      latanoprost (XALATAN) 0.005 % ophthalmic solution 1 drop at bedtime.     Multiple Vitamin (MULTIVITAMIN) capsule Take 1 capsule by mouth daily.     valACYclovir  (VALTREX ) 500 MG tablet TAKE ONE TABLET BY MOUTH twice daily FOR 3 DAYS AS NEEDED FOR outbreaks 6 tablet 4   donepezil  (ARICEPT ) 10 MG tablet TAKE ONE TABLET BY MOUTH EVERYDAY AT BEDTIME 30 tablet 5   finasteride  (PROSCAR ) 5 MG tablet Take 5 mg by mouth daily.     mirtazapine  (REMERON ) 45 MG tablet TAKE ONE TABLET BY MOUTH EVERYDAY AT BEDTIME 90 tablet 3   pantoprazole  (PROTONIX ) 40 MG tablet TAKE ONE TABLET BY MOUTH EVERY MORNING (Patient not taking: Reported on 02/24/2024) 30 tablet 11   No facility-administered medications prior to visit.   Allergies  Allergen Reactions   Sulfamethoxazole Other (See Comments)    Constipation   Sulfamethoxazole-Trimethoprim     constipation   Objective:   Today's Vitals   02/24/24 1017  BP: 132/80  Pulse: 71  Temp: 98.1 F (36.7 C)  TempSrc: Temporal  SpO2: 100%  Weight: 140 lb (63.5 kg)  Height: 5\' 6"  (1.676 m)   Body mass index is 22.6 kg/m.   General: Well developed, well nourished. No acute distress. HEENT: Normocephalic, non-traumatic. PERRL, EOMI. Arcus senilis. Conjunctiva clear. External ears   normal. EAC and TMs normal bilaterally. Nose clear without congestion or rhinorrhea. Mucous membranes   moist. Oropharynx clear. Edentulous with dentures. Neck: Supple. No lymphadenopathy. No thyromegaly. Lungs: Clear to auscultation bilaterally. No wheezing, rales or  rhonchi. CV: RRR without murmurs or rubs. Pulses 2+ bilaterally. Abdomen: Soft, non-tender. Bowel sounds positive, normal pitch and frequency. No hepatosplenomegaly.   No rebound or guarding. Extremities: Full ROM. No joint swelling or tenderness. No edema noted. Skin: Warm and dry. There is a 1 cm scaly, round macular patch on the left upper chest. There is a   horizontal scar across the mid chest that has keloid development. Psych: Alert and oriented. Normal mood and affect.  Health Maintenance Due  Topic Date Due   Zoster Vaccines- Shingrix (2 of 2) 08/05/2018     Assessment & Plan:   Problem List Items Addressed This Visit       Digestive   Gastroesophageal reflux disease   Stable. Continue Protonix  40 mg daily.        Nervous and Auditory   Sensorineural hearing  loss (SNHL), bilateral   Recommend he consider obtaining hearing aides, as hearing loss is associated with increased risk for progressive dementia.        Genitourinary   Prostate cancer [facility])   Continue monitoring with urology. I will renew finasteride  5 mg daily.      Relevant Medications   finasteride  (PROSCAR ) 5 MG tablet     Other   Annual physical exam - Primary   Overall health is good. Recommend ongoing regular exercise. Discussed recommended screenings and immunizations.       MCI (mild cognitive impairment)   Stable. I will renew his donepezil  10 mg daily.      Relevant Medications   donepezil  (ARICEPT ) 10 MG tablet    Return in about 1 year (around 02/23/2025) for Annual preventative care.   Graig Lawyer, MD

## 2024-02-24 NOTE — Assessment & Plan Note (Signed)
 Continue monitoring with urology. I will renew finasteride  5 mg daily.

## 2024-02-25 ENCOUNTER — Other Ambulatory Visit: Payer: Self-pay

## 2024-02-27 ENCOUNTER — Other Ambulatory Visit: Payer: Self-pay

## 2024-03-08 ENCOUNTER — Other Ambulatory Visit: Payer: Self-pay

## 2024-03-11 ENCOUNTER — Other Ambulatory Visit: Payer: Self-pay

## 2024-03-18 ENCOUNTER — Other Ambulatory Visit: Payer: Self-pay

## 2024-06-10 DIAGNOSIS — H18423 Band keratopathy, bilateral: Secondary | ICD-10-CM | POA: Diagnosis not present

## 2024-06-10 DIAGNOSIS — H16223 Keratoconjunctivitis sicca, not specified as Sjogren's, bilateral: Secondary | ICD-10-CM | POA: Diagnosis not present

## 2024-06-10 DIAGNOSIS — H40013 Open angle with borderline findings, low risk, bilateral: Secondary | ICD-10-CM | POA: Diagnosis not present

## 2024-06-10 DIAGNOSIS — Z961 Presence of intraocular lens: Secondary | ICD-10-CM | POA: Diagnosis not present

## 2024-07-13 DIAGNOSIS — H40013 Open angle with borderline findings, low risk, bilateral: Secondary | ICD-10-CM | POA: Diagnosis not present

## 2024-07-13 DIAGNOSIS — H16223 Keratoconjunctivitis sicca, not specified as Sjogren's, bilateral: Secondary | ICD-10-CM | POA: Diagnosis not present

## 2024-07-15 DIAGNOSIS — R972 Elevated prostate specific antigen [PSA]: Secondary | ICD-10-CM | POA: Diagnosis not present

## 2024-07-15 DIAGNOSIS — A6000 Herpesviral infection of urogenital system, unspecified: Secondary | ICD-10-CM | POA: Diagnosis not present

## 2024-07-15 DIAGNOSIS — N400 Enlarged prostate without lower urinary tract symptoms: Secondary | ICD-10-CM | POA: Diagnosis not present

## 2025-02-28 ENCOUNTER — Ambulatory Visit
